# Patient Record
Sex: Female | Born: 1935 | Race: White | Hispanic: No | Smoking: Never smoker
Health system: Southern US, Community
[De-identification: ages and names within clinical notes are randomized; demographics above are authoritative.]

## PROBLEM LIST (undated history)

## (undated) DIAGNOSIS — R269 Unspecified abnormalities of gait and mobility: Secondary | ICD-10-CM

## (undated) DIAGNOSIS — S32009A Unspecified fracture of unspecified lumbar vertebra, initial encounter for closed fracture: Secondary | ICD-10-CM

## (undated) DIAGNOSIS — R011 Cardiac murmur, unspecified: Secondary | ICD-10-CM

## (undated) DIAGNOSIS — L570 Actinic keratosis: Secondary | ICD-10-CM

## (undated) DIAGNOSIS — H409 Unspecified glaucoma: Secondary | ICD-10-CM

## (undated) DIAGNOSIS — R918 Other nonspecific abnormal finding of lung field: Secondary | ICD-10-CM

## (undated) DIAGNOSIS — R5383 Other fatigue: Secondary | ICD-10-CM

## (undated) DIAGNOSIS — S12600A Unspecified displaced fracture of seventh cervical vertebra, initial encounter for closed fracture: Secondary | ICD-10-CM

## (undated) DIAGNOSIS — I7090 Unspecified atherosclerosis: Secondary | ICD-10-CM

## (undated) DIAGNOSIS — R739 Hyperglycemia, unspecified: Secondary | ICD-10-CM

## (undated) DIAGNOSIS — K219 Gastro-esophageal reflux disease without esophagitis: Secondary | ICD-10-CM

## (undated) DIAGNOSIS — K5900 Constipation, unspecified: Secondary | ICD-10-CM

## (undated) DIAGNOSIS — B0229 Other postherpetic nervous system involvement: Secondary | ICD-10-CM

## (undated) DIAGNOSIS — E039 Hypothyroidism, unspecified: Secondary | ICD-10-CM

## (undated) DIAGNOSIS — Z9181 History of falling: Principal | ICD-10-CM

## (undated) DIAGNOSIS — N182 Chronic kidney disease, stage 2 (mild): Secondary | ICD-10-CM

## (undated) DIAGNOSIS — M7989 Other specified soft tissue disorders: Secondary | ICD-10-CM

## (undated) DIAGNOSIS — R5381 Other malaise: Secondary | ICD-10-CM

## (undated) DIAGNOSIS — G319 Degenerative disease of nervous system, unspecified: Secondary | ICD-10-CM

## (undated) DIAGNOSIS — S72142A Displaced intertrochanteric fracture of left femur, initial encounter for closed fracture: Secondary | ICD-10-CM

## (undated) DIAGNOSIS — IMO0002 Reserved for concepts with insufficient information to code with codable children: Secondary | ICD-10-CM

## (undated) DIAGNOSIS — R531 Weakness: Secondary | ICD-10-CM

## (undated) DIAGNOSIS — R413 Other amnesia: Secondary | ICD-10-CM

## (undated) DIAGNOSIS — I10 Essential (primary) hypertension: Secondary | ICD-10-CM

## (undated) DIAGNOSIS — H9190 Unspecified hearing loss, unspecified ear: Secondary | ICD-10-CM

## (undated) DIAGNOSIS — I679 Cerebrovascular disease, unspecified: Secondary | ICD-10-CM

## (undated) DIAGNOSIS — R9389 Abnormal findings on diagnostic imaging of other specified body structures: Secondary | ICD-10-CM

## (undated) DIAGNOSIS — E785 Hyperlipidemia, unspecified: Secondary | ICD-10-CM

## (undated) DIAGNOSIS — E871 Hypo-osmolality and hyponatremia: Secondary | ICD-10-CM

## (undated) DIAGNOSIS — B029 Zoster without complications: Secondary | ICD-10-CM

## (undated) DIAGNOSIS — D638 Anemia in other chronic diseases classified elsewhere: Secondary | ICD-10-CM

## (undated) DIAGNOSIS — S22019A Unspecified fracture of first thoracic vertebra, initial encounter for closed fracture: Secondary | ICD-10-CM

## (undated) DIAGNOSIS — H04129 Dry eye syndrome of unspecified lacrimal gland: Secondary | ICD-10-CM

## (undated) HISTORY — DX: Other amnesia: R41.3

## (undated) HISTORY — PX: CHOLECYSTECTOMY: SHX55

## (undated) HISTORY — DX: Gastro-esophageal reflux disease without esophagitis: K21.9

## (undated) HISTORY — DX: History of falling: Z91.81

## (undated) HISTORY — DX: Unspecified glaucoma: H40.9

## (undated) HISTORY — DX: Other malaise: R53.81

## (undated) HISTORY — DX: Other specified soft tissue disorders: M79.89

## (undated) HISTORY — DX: Other fatigue: R53.83

## (undated) HISTORY — DX: Unspecified fracture of unspecified lumbar vertebra, initial encounter for closed fracture: S32.009A

## (undated) HISTORY — DX: Constipation, unspecified: K59.00

## (undated) HISTORY — DX: Dry eye syndrome of unspecified lacrimal gland: H04.129

## (undated) HISTORY — DX: Chronic kidney disease, stage 2 (mild): N18.2

## (undated) HISTORY — DX: Unspecified abnormalities of gait and mobility: R26.9

## (undated) HISTORY — DX: Unspecified displaced fracture of seventh cervical vertebra, initial encounter for closed fracture: S12.600A

## (undated) HISTORY — DX: Hypo-osmolality and hyponatremia: E87.1

## (undated) HISTORY — DX: Cerebrovascular disease, unspecified: I67.9

## (undated) HISTORY — DX: Essential (primary) hypertension: I10

## (undated) HISTORY — DX: Other postherpetic nervous system involvement: B02.29

## (undated) HISTORY — DX: Cardiac murmur, unspecified: R01.1

## (undated) HISTORY — DX: Weakness: R53.1

## (undated) HISTORY — DX: Hypothyroidism, unspecified: E03.9

## (undated) HISTORY — DX: Hyperlipidemia, unspecified: E78.5

## (undated) HISTORY — DX: Abnormal findings on diagnostic imaging of other specified body structures: R93.89

## (undated) HISTORY — DX: Anemia in other chronic diseases classified elsewhere: D63.8

## (undated) HISTORY — DX: Unspecified fracture of first thoracic vertebra, initial encounter for closed fracture: S22.019A

## (undated) HISTORY — DX: Displaced intertrochanteric fracture of left femur, initial encounter for closed fracture: S72.142A

## (undated) HISTORY — DX: Unspecified atherosclerosis: I70.90

## (undated) HISTORY — DX: Unspecified hearing loss, unspecified ear: H91.90

## (undated) HISTORY — DX: Hyperglycemia, unspecified: R73.9

## (undated) HISTORY — DX: Zoster without complications: B02.9

## (undated) HISTORY — DX: Reserved for concepts with insufficient information to code with codable children: IMO0002

## (undated) HISTORY — DX: Actinic keratosis: L57.0

## (undated) HISTORY — DX: Degenerative disease of nervous system, unspecified: G31.9

---
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
## 1998-03-31 ENCOUNTER — Ambulatory Visit (HOSPITAL_COMMUNITY): Admission: RE | Admit: 1998-03-31 | Discharge: 1998-03-31 | Payer: Self-pay | Admitting: Emergency Medicine

## 1998-04-28 ENCOUNTER — Other Ambulatory Visit: Admission: RE | Admit: 1998-04-28 | Discharge: 1998-04-28 | Payer: Self-pay | Admitting: Emergency Medicine

## 1999-03-14 ENCOUNTER — Other Ambulatory Visit: Admission: RE | Admit: 1999-03-14 | Discharge: 1999-03-14 | Payer: Self-pay | Admitting: Emergency Medicine

## 1999-04-04 ENCOUNTER — Ambulatory Visit (HOSPITAL_COMMUNITY): Admission: RE | Admit: 1999-04-04 | Discharge: 1999-04-04 | Payer: Self-pay | Admitting: Emergency Medicine

## 1999-04-04 ENCOUNTER — Encounter: Payer: Self-pay | Admitting: Emergency Medicine

## 2002-10-22 HISTORY — PX: CARDIAC CATHETERIZATION: SHX172

## 2003-08-13 ENCOUNTER — Ambulatory Visit (HOSPITAL_COMMUNITY): Admission: RE | Admit: 2003-08-13 | Discharge: 2003-08-13 | Payer: Self-pay | Admitting: Emergency Medicine

## 2003-08-13 ENCOUNTER — Encounter: Payer: Self-pay | Admitting: Emergency Medicine

## 2004-07-18 ENCOUNTER — Encounter (INDEPENDENT_AMBULATORY_CARE_PROVIDER_SITE_OTHER): Payer: Self-pay | Admitting: Cardiology

## 2004-07-18 ENCOUNTER — Inpatient Hospital Stay (HOSPITAL_COMMUNITY): Admission: EM | Admit: 2004-07-18 | Discharge: 2004-07-20 | Payer: Self-pay | Admitting: Emergency Medicine

## 2004-07-25 ENCOUNTER — Ambulatory Visit: Payer: Self-pay | Admitting: Cardiology

## 2004-09-08 ENCOUNTER — Ambulatory Visit (HOSPITAL_COMMUNITY): Admission: RE | Admit: 2004-09-08 | Discharge: 2004-09-08 | Payer: Self-pay | Admitting: Emergency Medicine

## 2004-10-22 HISTORY — PX: GALLBLADDER SURGERY: SHX652

## 2004-11-09 ENCOUNTER — Ambulatory Visit: Payer: Self-pay | Admitting: Cardiology

## 2005-07-26 ENCOUNTER — Emergency Department (HOSPITAL_COMMUNITY): Admission: EM | Admit: 2005-07-26 | Discharge: 2005-07-26 | Payer: Self-pay | Admitting: Emergency Medicine

## 2005-07-31 ENCOUNTER — Encounter: Admission: RE | Admit: 2005-07-31 | Discharge: 2005-07-31 | Payer: Self-pay | Admitting: Emergency Medicine

## 2005-08-03 ENCOUNTER — Ambulatory Visit: Payer: Self-pay

## 2005-08-03 ENCOUNTER — Ambulatory Visit: Payer: Self-pay | Admitting: Cardiology

## 2005-08-05 ENCOUNTER — Encounter: Admission: RE | Admit: 2005-08-05 | Discharge: 2005-08-05 | Payer: Self-pay | Admitting: Emergency Medicine

## 2005-08-22 ENCOUNTER — Encounter (INDEPENDENT_AMBULATORY_CARE_PROVIDER_SITE_OTHER): Payer: Self-pay | Admitting: Specialist

## 2005-08-22 ENCOUNTER — Ambulatory Visit (HOSPITAL_COMMUNITY): Admission: RE | Admit: 2005-08-22 | Discharge: 2005-08-23 | Payer: Self-pay | Admitting: General Surgery

## 2005-10-11 ENCOUNTER — Ambulatory Visit (HOSPITAL_COMMUNITY): Admission: RE | Admit: 2005-10-11 | Discharge: 2005-10-11 | Payer: Self-pay | Admitting: Emergency Medicine

## 2006-03-04 ENCOUNTER — Encounter: Admission: RE | Admit: 2006-03-04 | Discharge: 2006-03-04 | Payer: Self-pay | Admitting: Emergency Medicine

## 2006-07-19 ENCOUNTER — Ambulatory Visit: Payer: Self-pay | Admitting: Cardiology

## 2006-08-08 ENCOUNTER — Ambulatory Visit: Payer: Self-pay

## 2006-08-19 ENCOUNTER — Ambulatory Visit: Payer: Self-pay | Admitting: Cardiology

## 2006-11-01 ENCOUNTER — Ambulatory Visit (HOSPITAL_COMMUNITY): Admission: RE | Admit: 2006-11-01 | Discharge: 2006-11-01 | Payer: Self-pay | Admitting: Emergency Medicine

## 2006-12-30 ENCOUNTER — Emergency Department (HOSPITAL_COMMUNITY): Admission: EM | Admit: 2006-12-30 | Discharge: 2006-12-30 | Payer: Self-pay | Admitting: Emergency Medicine

## 2007-10-28 ENCOUNTER — Encounter: Admission: RE | Admit: 2007-10-28 | Discharge: 2007-10-28 | Payer: Self-pay | Admitting: Emergency Medicine

## 2007-11-12 ENCOUNTER — Ambulatory Visit (HOSPITAL_COMMUNITY): Admission: RE | Admit: 2007-11-12 | Discharge: 2007-11-12 | Payer: Self-pay | Admitting: Emergency Medicine

## 2008-12-15 DIAGNOSIS — E039 Hypothyroidism, unspecified: Secondary | ICD-10-CM

## 2008-12-15 DIAGNOSIS — H409 Unspecified glaucoma: Secondary | ICD-10-CM

## 2008-12-15 DIAGNOSIS — I10 Essential (primary) hypertension: Secondary | ICD-10-CM

## 2008-12-15 HISTORY — DX: Essential (primary) hypertension: I10

## 2008-12-15 HISTORY — DX: Hypothyroidism, unspecified: E03.9

## 2008-12-15 HISTORY — DX: Unspecified glaucoma: H40.9

## 2009-06-30 DIAGNOSIS — H9190 Unspecified hearing loss, unspecified ear: Secondary | ICD-10-CM

## 2009-06-30 HISTORY — DX: Unspecified hearing loss, unspecified ear: H91.90

## 2009-12-29 DIAGNOSIS — R5381 Other malaise: Secondary | ICD-10-CM

## 2009-12-29 DIAGNOSIS — H04129 Dry eye syndrome of unspecified lacrimal gland: Secondary | ICD-10-CM

## 2009-12-29 HISTORY — DX: Other fatigue: R53.81

## 2009-12-29 HISTORY — DX: Dry eye syndrome of unspecified lacrimal gland: H04.129

## 2011-03-09 NOTE — Assessment & Plan Note (Signed)
[facility]                              CARDIOLOGY OFFICE NOTE   Kim Hodges, Kim Hodges                      MRN:          [id number]  DATE:07/19/2006                            DOB:          [birth year]    PRIMARY CARE PHYSICIAN:  Dr. Leslee Home.   PRIMARY CARDIOLOGIST:  Dr. Rollene Rotunda.   HISTORY OF PRESENT ILLNESS:  Kim Hodges is a very pleasant 77 year old  female patient with a history of normal coronary arteries by catheterization  September 2005, who presents to the office today with complaints of chest  and epigastric pain.  She notes that her symptoms date back probably 2  years.  They have been off and on since then.  About a month ago she went  and saw Dr. Lorenz Coaster for recurrence of her symptoms, and he suggested that she  come back and see [place].  She started to feel better and decided not to come.  Over the last couple of weeks, though, she has had a recurrence of her  symptoms, and has come to be seen.  She was seen in the office today.  She  notes an ache in her epigastrium and left chest from time to time.  This is  not necessarily related to exertion.  It not necessarily related to meals.  She denies any significant belching or water brash symptoms.  She does note  a dry cough.  She does feel nauseated from time to time.  She also does note  dizziness or lightheadedness with her symptoms.  This seems to be a more  prominent symptom for her recently.  She has felt her pulse in her abdomen  as well.  She denies orthopnea or paroxysmal nocturnal dyspnea.  She denies  syncope or pedal edema.   CURRENT MEDICATIONS:  1. Levothyroxine 60 mcg daily.  2. Norvasc 2.5 mg daily.  3. Xalatan eye drops.   ALLERGIES:  No known drug allergies.   SOCIAL HISTORY:  She denies any tobacco or alcohol abuse.   REVIEW OF SYSTEMS:  Please see HPI.  She denies any fevers, chills,  headache, sore throat, changes in her vision, dysphagia,  odynophagia,  melena, hematochezia, hematuria, dysuria.  She has noted some increasing  urinary frequency recently.  The rest of the review of systems are negative.   PHYSICAL EXAMINATION:  She is a well-developed, well-nourished female in no  distress.  Blood pressure 161/79, pulse 81, weight 120 pounds.  Orthostatic vital  signs; lying blood pressure 147/85, pulse 74; sitting 144/88, pulse 74;  standing 138/82 with a pulse of 80; after two minutes 156/80 with a pulse of  75; after five minutes 152/81 with a pulse of 79.  HEAD:  Normocephalic, atraumatic.  EYES:  PERRLA, EOMI.  Sclerae are clear.  NECK:  Without lymphadenopathy.  ENDOCRINE:  Without thyromegaly.  CAROTIDS:  Without bruits.  CARDIAC:  Normal S1, S2, regular rate and rhythm without murmurs, clicks,  rubs or gallops  LUNGS:  Clear to auscultation bilaterally without wheezing, rhonchi or  rales.  ABDOMEN:  With normoactive bowel sounds, soft.  She does have epigastric  tenderness with palpation.  There is no organomegaly.  EXTREMITIES:  Without edema, calves are soft, nontender.  SKIN:  Warm and dry.  NEUROLOGIC:  She is alert and oriented x3.  Cranial nerves II-XII are  grossly intact.   Electrocardiogram reveals sinus rhythm with a heart rate of 79, with  nonspecific ST-T wave changes.   IMPRESSION:  1. Chest and epigastric pain.  2. Lightheadedness.  3. Normal coronary arteries by catheterization September 2005.  4. Good left ventricular function.  5. Hyperlipidemia.  6. Status post cholecystectomy.  7. Hypertension.   PLAN:  The patient presents to the office today with chest and epigastric  pain.  She also has some lightheadedness.  She is able to sense her pulse in  her abdomen from time to time as well.  She had normal coronary arteries by  catheterization in 2005.  I do not think ischemia would be causing her pain.  It sounds atypical for ischemia.  She has had CT scans show some changes in  her  pancreas in the past, and this is followed serially by Dr. Lorenz Coaster.  I  think her symptoms are most likely gastrointestinal.  We will start her on  Protonix 40 mg a day.  We will also check a CMET, CBC, TSH, amylase and  lipase.  We will also get an abdominal ultrasound to rule out abdominal  aortic aneurysm.  Her blood pressure is uncontrolled, and I have asked her  to increase her Norvasc to 5 mg a day.  I will bring the patient back in  followup with Dr. Antoine Poche in the next couple of weeks.  I discussed the  above assessment and plan with Dr. Jens Som today, who agreed.      ______________________________  Tereso Newcomer, PA-C    ______________________________  Madolyn Frieze. Jens Som, MD, [facility]   SW/MedQ  DD:  07/19/2006  DT:  07/21/2006  Job #:  [id number]   cc:   Reuben Likes, M.D.

## 2011-03-09 NOTE — Op Note (Signed)
NAMEMALIN, SAMBRANO               ACCOUNT NO.:  [id number]   MEDICAL RECORD NO.:  [id number]          PATIENT TYPE:  OIB   LOCATION:  1318                         FACILITY:  [facility]   PHYSICIAN:  Leonie Man, M.D.   DATE OF BIRTH:  07-Mar-1909   DATE OF PROCEDURE:  08/22/2005  DATE OF DISCHARGE:                                 OPERATIVE REPORT   PREOPERATIVE DIAGNOSIS:  Symptomatic cholelithiasis.   POSTOPERATIVE DIAGNOSIS:  Symptomatic cholelithiasis.   PROCEDURE:  Laparoscopic cholecystectomy with intraoperative cholangiogram.   SURGEON:  Leonie Man, M.D.   ASSISTANT:  Currie Paris, M.D.   ANESTHESIA:  General.   SPECIMENS TO LAB:  Gallbladder with stones.   ESTIMATED BLOOD LOSS:  Minimal.   COMPLICATIONS:  None. The patient returned to the PACU in excellent  condition.   HISTORY:  Ms. Weatherholtz is a 77 year old lady presenting with right upper  quadrant epigastric and right-sided shoulder pain associated with nausea but  without vomiting. On evaluation by CT scan, she is noted to have a stone in  the neck of the gallbladder but it was not obstructing. Her intrahepatic and  extrahepatic biliary ducts appeared to be normal. However, she did have some  ectasia of her pancreatic duct on CT scan. All of her liver function studies  were within normal limits and she did not have any elevation of amylase or  lipase. The patient comes to the operating room now after the risks and  potential benefits of surgery have been discussed, all questions answered  and consent obtained.   PROCEDURE:  Following the induction of satisfactory general anesthesia, the  patient is positioned supinely. The abdomen is routinely prepped and draped  to be included in a sterile operative field. Open laparoscopy is created at  the umbilicus with insertion of a Hassan cannula and insufflation of the  peritoneal cavity to 14 mmHg pressure using carbon dioxide. The camera was  inserted.  There were multiple adhesions at the anterior abdominal wall due  to prior abdominal surgery. These were taken down serially both  laparoscopically and by finger dissection at the umbilicus. After the camera  was inserted, visual exploration of the abdomen carried out. The anterior  gastric wall duodenal sweep appeared to be normal, the liver edge was sharp,  liver surfaces smooth. The gallbladder was chronically scarred. None of the  small or large intestine viewed appeared to be abnormal. Pelvic organs were  not visualized.   Under direct vision, epigastric and lateral ports were placed. The  gallbladder is grasped and retracted cephalad. Dissection carried down in  the region of the hepatoduodenal ligament with isolation of the cystic  artery and cystic duct. The cystic artery is traced to its entry into the  gallbladder wall and the cystic duct was traced up to the gallbladder cystic  duct junction and down to the cystic duct, common bile duct junction. The  cystic duct was clipped proximally and opened and a Cook catheter placed  into the abdomen and then inserted into the cystic duct. Injection of 1/2  strength Hypaque dye into the extrahepatic  biliary system for cholangiogram  was carried out under fluoroscopic control. The resulting cholangiogram  showed prompt flow of contrast into the common bile duct into the duodenum  with some delayed entry into the upper hepatic radicles. The distal common  duct tapered well as well as the caliber of the common bile duct was  somewhat ectatic but otherwise within normal limits. The cystic duct  catheter was removed and the cystic duct doubly clipped and transected. The  cystic artery was doubly clipped and transected and the gallbladder was then  dissected free from the liver bed using electrocautery maintaining  hemostasis throughout the entire course of the dissection. At the end of  dissection, the gallbladder was placed in an Endopouch,  additional bleeding  points within the liver bed were treated with electrocautery. The camera was  then moved to the epigastric port and the gallbladder was retrieved through  the umbilical port without difficulty. Sponge, instrument and sharp counts  were verified. The pneumoperitoneum was released and the lateral trocars  were removed under direct vision. The umbilical wound was then closed in two  layers with #0 Dexon and 4-0 Monocryl. The epigastric wound and the lateral  flank wounds were closed with 4-0 Monocryl sutures placed intradermally. All  of the wounds were then reinforced with Steri-Strips and sterile dressings  were applied. The anesthetic reversed and the patient removed from the  operating room to the recovery room in stable condition. She tolerated the  procedure well.      Leonie Man, M.D.  Electronically Signed     PB/MEDQ  D:  08/22/2005  T:  08/23/2005  Job:  [id number]   cc:   Reuben Likes, M.D.  Fax: [phone]

## 2011-03-09 NOTE — Cardiovascular Report (Signed)
NAMEKAYANI, RAPAPORT               ACCOUNT NO.:  [id number]   MEDICAL RECORD NO.:  [id number]          PATIENT TYPE:  INP   LOCATION:  3711                         FACILITY:  [facility]   PHYSICIAN:  Salvadore Farber, M.D. LHCDATE OF BIRTH:  01/08/1909   DATE OF PROCEDURE:  07/20/2004  DATE OF DISCHARGE:                              CARDIAC CATHETERIZATION   PROCEDURE:  1.  Left cardiac catheterization.  2.  Left ventriculography.  3.  Coronary angiography.   CARDIOLOGIST:  Salvadore Farber, M.D.   INDICATIONS FOR PROCEDURE:  The patient is a 77 year old lady without a  prior history of cardiovascular disease.  The risks factors are diet-  controlled diabetes and dyslipidemia, as well as age.  She presents with  exertional chest discomfort and dizziness over the past several weeks.  She  was admitted to the hospital and ruled out for a myocardial infarction.  She  was referred for a diagnostic angiography to exclude coronary artery disease  as the etiology of her symptoms.   DESCRIPTION OF PROCEDURE:  An informed consent was obtained.  Under 1%  lidocaine local anesthesia, a 5-French sheath was placed in the right common  femoral artery using the modified Seldinger technique.  A diagnostic  angiography and ventriculography were performed using 5-French JL4, JR4 and  pigtail catheters.  The patient tolerated the procedure well and was  transferred to the holding room in stable condition.   COMPLICATIONS:  None.   FINDINGS:  LV:  157/2/8.  Ejection fraction greater than 70% without regional wall motion abnormality.  No aortic stenosis or mitral regurgitation.  There is mild mitral annular  calcification and mild calcification of the aortic valve.   RESULTS:  1.  LEFT MAIN CORONARY ARTERY:  The left main coronary artery is a long      vessel which is angiographically normal.  2.  LEFT ANTERIOR DESCENDING CORONARY ARTERY:  The left anterior descending      coronary artery is a  moderate-sized vessel giving rise to three modest-      sized diagonal branches.  It is angiographically normal.  3.  CIRCUMFLEX CORONARY ARTERY:  The circumflex coronary artery is a      moderate-sized vessel, giving rise to a single large obtuse marginal.      It is angiographically normal.  4.  RIGHT CORONARY ARTERY:  The right coronary artery is a large dominant      vessel.  It was angiographically normal.   IMPRESSION/RECOMMENDATIONS:  Angiographically-normal coronary arteries.  Normal left ventricular size and systolic function.  I suspect a non-cardiac  etiology to her chest discomfort.       WED/MEDQ  D:  07/20/2004  T:  07/20/2004  Job:  [id number]   cc:   Reuben Likes, M.D.  [address]  [place]  [place] [address]  Fax: [phone]   Rollene Rotunda, M.D.

## 2011-03-09 NOTE — Consult Note (Signed)
Kim Hodges, Kim Hodges               ACCOUNT NO.:  [id number]   MEDICAL RECORD NO.:  [id number]          PATIENT TYPE:  INP   LOCATION:  3711                         FACILITY:  [facility]   PHYSICIAN:  Rollene Rotunda, M.D.   DATE OF BIRTH:  Mar 25, 1909   DATE OF CONSULTATION:  07/18/2004  DATE OF DISCHARGE:                                   CONSULTATION   PRIMARY CARE PHYSICIAN:  Dr. Leslee Home.   CONSULTING PHYSICIAN:  Dr. Lonia Blood.   REASON FOR PRESENTATION:  Evaluate patient with arm, chest discomfort and  dizziness.   HISTORY OF PRESENT ILLNESS:  The patient is a lovely, remarkable 77 year old  white female with no prior cardiac history.  She was in her usual state of  good health until approximately two or three days ago.  She noticed episodes  of dizziness.  These occurred predominantly with exertion, and she noticed  after eating meals. She felt presyncopal, but has had no frank syncope.  The  symptoms would stop when she would rest.  She was describing some mild  shortness of breath.  The longest episode was approximately 20 minutes.  She  did feel her heart racing.  She had a couple of episodes through the  weekend.  Yesterday in the afternoon, she was picking beans and then later  getting ready for bed, and developed similar symptoms.  However, with this  episode, she had arm discomfort.  It was a tightness.  She had some  tightness in her chest.  She did feel her heart racing. She called the  emergency squad and was given two sublingual nitroglycerin with improvement.  The symptoms lasted for approximately one hour.  She has had some mild,  resting chest discomfort occurring one time since admission.  Enzymes have  so far been negative, and her telemetry strip has demonstrated sinus rhythm.  She has had no acute EKG changes.   Otherwise, the patient has been well.  She is active, doing gardening, and  likes to pick black walnuts. She can vacuum and do other things  without  these symptoms.  She does not seem to get chest pressure, neck discomfort or  shortness of breath.  She has no PND or orthopnea.   PAST MEDICAL HISTORY:  1.  Diabetes, diet controlled.  2.  Hyperlipidemia.  3.  Anemia many years ago.   PAST SURGICAL HISTORY:  1.  Cataracts.  2.  Hysterectomy.  3.  Lysis of adhesions.   ALLERGIES:  NONE.   MEDICATIONS:  Prior to admission:  1.  Multivitamin.  2.  Calcium.  3.  Fish oil.  4.  Aspirin 325 mg daily.   SOCIAL HISTORY:  The patient lives in [address].  She is a widow.  She  lives with her granddaughter.  She is retired.  She does not smoke  cigarettes or drink alcohol, and never has.   FAMILY HISTORY:  Noncontributory for early coronary artery disease.  Her  mother lived to be 105.   REVIEW OF SYSTEMS:  As stated in the HPI, and positive for staining of her  fingers related to picking black walnuts.  Negative for all other systems.   PHYSICAL EXAMINATION:  GENERAL:  The patient is in no distress.  VITAL SIGNS:  Blood pressure 129/74, heart rate 72 and regular, afebrile.  HEENT:  Eyes are unremarkable.  Pupils are equal, round and reactive to  light.  Fundi are not visualized.  Oral mucosa unremarkable.  NECK:  No jugular venous distension.  Waveform within normal limits.  Carotid upstroke brisk and symmetric, no bruits or thyromegaly.  LYMPHATICS:  No cervical, axillary, or inguinal adenopathy.  LUNGS:  Clear to auscultation bilaterally.  BACK:  No costovertebral angle tenderness.  CHEST:  Unremarkable.  HEART:  PMI displaced or sustained.  S1 and S2 within normal limits, no S3,  no S4, no murmurs.  ABDOMEN:  Flat, positive bowel sounds, normal in frequency and pitch.  No  bruits, no rebound, no guarding, no pulsatile mass, no hepatomegaly, or  splenomegaly.  SKIN:  No rashes, no nodules.  EXTREMITIES:  There are 2+ pulses throughout.  No edema, no cyanosis, no  clubbing.  No bruits.  NEUROLOGIC:  Oriented to  person, place and time.  Cranial nerves II-XII are  grossly intact.  Motor grossly intact.   EKG:  Sinus rhythm, axis within normal limits.  Intervals within normal  limits.  No acute ST-T wave changes.   ASSESSMENT/PLAN:  1.  Chest and arm discomfort.  This is very worrisome in this patient with      risk factors and advanced age.  Physiologically, she is much younger      than her 89 years.  I think the pre-test probability is moderately high      for obstructive coronary disease.  Therefore, given all of this, I would      suggest cardiac catheterization as the diagnostic test of choice.  I      have had a long discussion with the patient and her granddaughter      regarding this, and they understand the risks and benefits and agree to      proceed.  2.  Dizziness.  This is less likely to be an anginal equivalent.  If she      does not have obstructive coronary disease,  I      would suggest an outpatient event monitor to evaluate this.  3.  Risk reduction.  The degree to which we manage her dyslipidemia will be      based on the presence or absence of obstructive coronary disease.       JH/MEDQ  D:  07/18/2004  T:  07/18/2004  Job:  [id number]   cc:   Reuben Likes, M.D.  [address]  [place]  [place] [address]  Fax: [phone]   Lonia Blood, M.D.  Fax: [phone]

## 2011-03-09 NOTE — H&P (Signed)
NAMEORION, VANDERVORT               ACCOUNT NO.:  [id number]   MEDICAL RECORD NO.:  [id number]          PATIENT TYPE:  INP   LOCATION:  3711                         FACILITY:  [facility]   PHYSICIAN:  Mobolaji B. Bakare, M.D.DATE OF BIRTH:  1909/07/08   DATE OF ADMISSION:  07/17/2004  DATE OF DISCHARGE:                                HISTORY & PHYSICAL   PRIMARY CARE PHYSICIAN:  Reuben Likes, M.D.   CHIEF COMPLAINT:  Dizziness after meals.   HISTORY OF PRESENT ILLNESS:  The patient started noticing dizziness  especially after eating breakfast about 3 days ago.  She first noticed it on  Saturday after breakfast about 20 minutes after breakfast.  She felt hard  inside.  There was no diaphoresis.  She felt like passing out, but she did  not pass out.  She had no nausea or vomiting.  It went away spontaneously.  She did have some feeling of blurred vision and shakiness and also pressure  retrosternally.  The following day after breakfast on Sunday, she  experienced similar symptoms.  It became more intense after supper tonight  and he was clammy with palpitations, also feeling numb and pain in her left  forearm.  Hence, EMS was called and they gave her four baby aspirin and two  nitroglycerin.  She felt better after the nitroglycerin.  She also received  sublingual nitroglycerin en route to the hospital.  On arrival in the  emergency department, she was chest pressure free as symptoms were  resolving.  She does have associated generalized weakness when she was  symptomatic.  She experienced retrosternal burning sensation last week which  resolved spontaneously.  Ms. Villatoro does gardening on a regular basis and  still went out to do gardening today.  Usually, she gardens for about 1-2  hours, but she could not do that today and she did not experience chest pain  or diaphoresis when she was doing the gardening.   She does not have any known cardiac history.  However, 3 years ago in  [place], where she was formerly residing, she went into the hospital for low  blood glucose.  At that time, she had a cardiac stress test which she said  was normal.   PAST MEDICAL HISTORY:  No known significant past medical history.  She just  recently had a physical done in May.  At that time, her cholesterol was  elevated so she was put on diet-control and fish oil by Dr. Lorenz Coaster.  A long  time ago, she was told that she had diabetes mellitus and she was put on  diet-control and exercise.  She has been checking her blood glucose ever  since and has remained within normal limits.   PAST SURGICAL HISTORY:  1.  Hysterectomy 60 years ago.  2.  Cataract removal bilaterally.   MEDICATIONS:  Vitamins, Calcium, aspirin 325 mg once a day, fish oil.   ALLERGIES:  No known drug allergies.   FAMILY HISTORY:  She has significant family history of pancreatic cancer.  Two of her brothers died of pancreatic cancer.  One sister  died of lymphoma.  Son died of lung cancer.  She is a widow.  She is involved in cancer  research program at [facility] for pancreatic cancer.   SOCIAL HISTORY:  Currently, the patient lives with her daughter.  She does  not use alcohol.  She never smoked.   REVIEW OF SYSTEMS:  CARDIOPULMONARY:  No shortness of breath.  NEUROLOGIC:  No headaches.  GASTROINTESTINAL:  No abdominal pain, diarrhea, vomiting or  constipation.  GENITOURINARY:  No symptoms.   PHYSICAL EXAMINATION:  VITAL SIGNS:  Blood pressure 160/82, temperature  97.3, pulse 79, respirations 16, O2 saturations 100%.  GENERAL:  She appears comfortable and looks younger than her stated age of  77.  Not pale.  HEENT:  Anicteric.  Pupils equal round and reactive to light.  No carotid  bruits.  No cervical lymphadenopathy.  LUNGS:  Clear to auscultation.  CARDIAC:  S1, S2, regular.  No murmurs, rubs or gallops.  ABDOMEN:  Nondistended.  Soft, nontender.  Bowel sounds present.  No  holosystolic murmur.   EXTREMITIES:  No pedal edema and no calf tenderness.  Dorsalis pedis pulses  palpable bilaterally.  NEUROLOGIC:  No focal neurological deficits.  SKIN:  No rash or petechiae.   LABORATORY DATA AND X-RAY FINDINGS:  Point of care cardiac enzymes with  myoglobin 227 ng/ml elevated.  Subsequently myoglobin dropped to 194, then  164, troponin was less than 0.05, CK-MB within normal limits.  White cell  count 9.4, hemoglobin 12.8, hematocrit 26.3, MCV 89.1, platelets 245.  Sodium 135, potassium 4.1, chloride 102, CO2 25, glucose 111, BUN 18,  creatinine 1.0.  Calculated creatinine clearance is 70.   EKG shows normal sinus rhythm with a heart rate of 78 beats per minute.  No  ST segment elevation, normal axis, low voltage in the precordial leads.   ASSESSMENT/PLAN:  Ms. Nembhard is a 77 year old, pleasant lady who was  previously healthy presenting with postprandial dizziness, chest pressure,  palpitations and tingling sensation in the left forearm.  She had elevated  myoglobin on initially evaluation in emergency department.  Electrocardiogram with no ST and T abnormality.   1.  Possible coronary artery disease with postprandial syncope.  We will      admit to telemetry.  Obtain serial cardiac enzymes, nitroglycerin paste      1/2 inch q.4h., Lovenox 60 mg subcu b.i.d., aspirin 325 mg p.o. q.d.,      Lopressor 12.5 mg p.o. b.i.d., sublingual nitroglycerin for chest pain.      Will check fasting lipid profile.  If the patient rules out for      myocardial infarction, would obtain Cardiolite stress test and call      cardiac consult.  2.  Hypertension.  This is probably due to chest pain and chest pressure.      Will monitor for now.  The patient is on low-dose Lopressor at this      point.  3.  History of hyperlipidemia on fish oil and diet.  Will repeat fasting      lipid profile.      MBB/MEDQ  D:  07/18/2004  T:  07/18/2004  Job:  [id number]  cc:   Reuben Likes, M.D.  [address]  [place]  [place] [address]  Fax: [phone]

## 2011-03-09 NOTE — Assessment & Plan Note (Signed)
[facility]                              CARDIOLOGY OFFICE NOTE   Kim, Hodges                      MRN:          [id number]  DATE:08/19/2006                            DOB:          1909-03-27    PRIMARY:  Dr. Leslee Home.   REASON FOR PRESENTATION:  Evaluate the patient with chest discomfort.   HISTORY OF PRESENT ILLNESS:  The patient is a lovely 77 year old who  presented last month for evaluation of chest discomfort.  It was felt to be  atypical.  She was put on Protonix 40 mg.  She thinks this helped.  It made  her feel stronger.  She does not really describe less chest discomfort,  however.  She did get an abdominal ultrasound because she was feeling her  stomach pulsate.  This demonstrated mild aortoiliac atherosclerosis, but no  aneurysm.   She comes back today and says that about every 7 days or so she will get  chest discomfort.  She describes it as a Therapist, sports in her chest.  She says  it is a pressure that comes on boom, boom, boom.  However, she does not  think it is cardiac.  I cannot clearly get a reason for this.  It happens  sporadically.  It does not bother her during her sleep.  She gets nervous  that it is going to get more intense.  It is a discomfort with some  radiation to her arm.  It has been the same discomfort that she has had over  time, including at the time of her catheterization in 2005.  It goes away  with rest.  She says that her blood pressure has been better controlled  since her Norvasc was increased at the last appointment.   PAST MEDICAL HISTORY:  Well-preserved ejection fraction.  No coronary artery  disease.  Hyperlipidemia.  Hysterectomy.  Cataract surgery.  Hypertension.   ALLERGIES:  None.   MEDICATIONS:  1. Levothyroxine 50 mcg q. day.  2. Xalatan eye drops.  3. Norvasc 5 mg q. day.  4. Protonix 40 mg q. day.   REVIEW OF SYSTEMS:  As stated in the HPI.  Otherwise negative for other  systems.   PHYSICAL EXAMINATION:  The patient is in no distress.  Blood pressure 162/74, heart rate 80 and regular, weight 125 pounds, body  mass index 21.  HEENT:  Eyes unremarkable.  Pupils equal, round, and reactive to light.  Fundi not visualized.  Oral mucosa unremarkable.  NECK:  No jugular venous distension at 45 degrees, carotid upstroke brisk  and symmetric.  No bruits or thyromegaly.  LYMPHATICS:  No cervical, axillary, or inguinal adenopathy.  LUNGS:  Clear to auscultation bilaterally.  BACK:  No costovertebral angle tenderness.  CHEST:  Unremarkable.  HEART:  PMI not displaced or sustained.  S1, S2 within normal limits.  No  S3, no S4.  No murmurs.  ABDOMEN:  Flat, positive bowel sounds, normal frequency and pitch.  No  bruits, rebound, guarding, or midline pulsatile masses, hepatomegaly, or  splenomegaly.  SKIN:  No rashes.  No  nodules.  EXTREMITIES:  2+ pulses throughout.  No edema, cyanosis, or clubbing.  NEURO:  Oriented to person, place, and time.  Cranial nerves 2-12 grossly  intact.  Motor grossly intact.   EKG:  Sinus rhythm.  Rate 73.  Axis within normal limits.  Intervals within  normal limits.  No acute ST-T wave change.   ASSESSMENT AND PLAN:  1. Chest, the patient's chest discomfort is non-anginal.  However, it      could be related to an irregular or ectopic heartbeat.  Certainly      sounds rhythmically like it could be cardiac.  We discussed options      including doing nothing, treating her with a daily beta blocker.      Ultimately, we have settled on an as needed beta blocker.  I will give      her a low-dose immediate release metoprolol 12.5 mg to be taken when      she gets the discomfort.  She should lie down.  She could continue this      as needed if she thinks it helps.  Some of this may be a component of      anxiety.  If she has any symptoms related to this, she will let me know      and she will stop taking it.  She will not take this daily,  however.  2. Hypertension.  Blood pressure is elevated today but had not been      elevated at home.  I will believe these readings, that she is having a      little bit of a white coat response.  This can be followed at home with      a blood pressure diary and adjustments made to her Norvasc as needed.  3. Followup.  I will put her down for a 56-month followup but we will see      her sooner if she gets worse rather than better.  She could cancel the      6 month followup if her symptoms go away all together.    ______________________________  Rollene Rotunda, MD, [facility]    JH/MedQ  DD: 08/19/2006  DT: 08/19/2006  Job #: [id number]   cc:   Kim Hodges, M.D.

## 2011-03-09 NOTE — Discharge Summary (Signed)
Kim Hodges, Kim Hodges               ACCOUNT NO.:  [id number]   MEDICAL RECORD NO.:  [id number]          PATIENT TYPE:  INP   LOCATION:  3711                         FACILITY:  [facility]   PHYSICIAN:  Michaelyn Barter, M.D. DATE OF BIRTH:  Mar 02, 1909   DATE OF ADMISSION:  07/17/2004  DATE OF DISCHARGE:  07/20/2004                                 DISCHARGE SUMMARY   FINAL DIAGNOSES:  1.  Atypical chest pain.  2.  Presyncope.   PROCEDURES:  Cardiac catheterization.   COMPLICATIONS:  None.   HISTORY OF PRESENT ILLNESS:  Kim Hodges is a 77 year old female with a  questionable past medical history of diabetes mellitus diet controlled, who  was admitted secondary to a chief complaint of dizziness following her  meals.  At the time of her presentation, she stated that the dizziness was  initially noted approximately three days prior to admission.  She denied  having any diaphoresis when this occurred.  She felt like passing out,  however, she never did.  The dizziness resolved spontaneously.  After the  initial onset, she stated that the following day she experienced a similar  episode, however, it was more intense.  She felt clammy with palpitations.  She also experienced some numbness and some pain in her left forearm.  EMS  was called and she was given a baby aspirin and some sublingual  nitroglycerin.  She stated that her symptoms resolved following the  nitroglycerin.  She also complained of some retrosternal burning sensation  during the previous week.   PAST MEDICAL HISTORY:  1.  Hypercholesterolemia controlled by diet and fish oil.  2.  Questionable diabetes mellitus.   PAST SURGICAL HISTORY:  1.  Hysterectomy 60 years ago.  2.  Cataract removal bilaterally.   HOME MEDICATIONS:  1.  Vitamins.  2.  Calcium.  3.  Aspirin 325 mg a day.  4.  Fish oil.   ALLERGIES:  No known drug allergies.   FAMILY HISTORY:  Two brothers died from pancreatic CA.  One sister died from  lymphoma.  One son died from lung cancer.   SOCIAL HISTORY:  The patient denies alcohol.  She denies cigarettes.   HOSPITAL COURSE:  The patient had an elevated myoglobin on her initial  evaluation in the emergency room.  However, her EKG revealed no ST or T wave  abnormalities.  She was admitted into the hospital to the telemetry floor.  Cardiac enzymes were drawn and nitroglycerin paste was started.  She was  also started on Lovenox 60 mg subcutaneously b.i.d., aspirin 325 mg p.o.  daily, Lopressor 12.5 mg b.i.d. and sublingual nitroglycerin, all of which  was done to rule out a cardiac event.  Subsequently cardiology was consulted  and Dr. Antoine Hodges was the physician who responded to the consult.  At that  time, he felt that the pretest probability was moderately high for  obstructive coronary artery disease and he suggested a cardiac  catheterization as the diagnostic test of choice.  This morning, the patient  underwent a cardiac catheterization, the results of which revealed a left  main coronary  artery that is reported to be angiographically normal.  The  left anterior descending coronary artery was also angiographically normal.  The circumflex coronary artery was also angiographically normal.  Finally,  the right coronary artery was reported as being angiographically normal.  As  per cardiology's note, the patient was okay following the procedure.  There  were no complications during the procedure and their note following the  procedure indicated that the patient should be fine from their point of view  to be discharged home.  Currently the patient states that she feels fine.  She denies having any chest pain.  She denies having any shortness of  breath.  She states that she would like to go home today as soon as  possible.   CONDITION ON DISCHARGE:  She appears to be in no obvious distress.  Her  vitals are temperature 97.8 degrees, heart rate 62, respirations 18, blood   pressure 116/61 and SPO2 99% on room air.  On heart exam, S1 and S2 are  present with regular rate and rhythm.  Her lungs are clear bilaterally.  Her  abdomen is soft.  Extremities with no leg edema.   DISPOSITION:  The patient's condition at the time of discharge is much  improved.  She will be discharged home today.   FOLLOWUP:  She is instructed to follow up with Dr. Antoine Hodges on July 29, 2004, at 3 p.m.  Likewise, she is also instructed to follow up with her  primary care physician, Kim Hodges, M.D., within one week.   DISCHARGE MEDICATIONS:  She will resume all of her previously prescribed  home medications.       OR/MEDQ  D:  07/20/2004  T:  07/20/2004  Job:  [id number]   cc:   Kim Hodges, M.D.  [address]  [place]  [place] [address]  Fax: [phone]   Rollene Rotunda, M.D.

## 2011-07-26 DIAGNOSIS — R269 Unspecified abnormalities of gait and mobility: Secondary | ICD-10-CM

## 2011-07-26 DIAGNOSIS — R7989 Other specified abnormal findings of blood chemistry: Secondary | ICD-10-CM | POA: Insufficient documentation

## 2011-07-26 DIAGNOSIS — E785 Hyperlipidemia, unspecified: Secondary | ICD-10-CM

## 2011-07-26 DIAGNOSIS — L570 Actinic keratosis: Secondary | ICD-10-CM

## 2011-07-26 DIAGNOSIS — R739 Hyperglycemia, unspecified: Secondary | ICD-10-CM

## 2011-07-26 HISTORY — DX: Actinic keratosis: L57.0

## 2011-07-26 HISTORY — DX: Hyperlipidemia, unspecified: E78.5

## 2011-07-26 HISTORY — DX: Hyperglycemia, unspecified: R73.9

## 2011-07-26 HISTORY — DX: Unspecified abnormalities of gait and mobility: R26.9

## 2011-09-11 ENCOUNTER — Ambulatory Visit
Admission: RE | Admit: 2011-09-11 | Discharge: 2011-09-11 | Disposition: A | Discharge status: Home / Self Care | Payer: Medicare Other | Source: Ambulatory Visit | Attending: Internal Medicine | Admitting: Internal Medicine

## 2011-09-11 ENCOUNTER — Other Ambulatory Visit: Payer: Self-pay | Admitting: Internal Medicine

## 2011-09-11 DIAGNOSIS — M1710 Unilateral primary osteoarthritis, unspecified knee: Secondary | ICD-10-CM

## 2011-09-11 DIAGNOSIS — M79606 Pain in leg, unspecified: Secondary | ICD-10-CM

## 2011-09-11 DIAGNOSIS — R52 Pain, unspecified: Secondary | ICD-10-CM

## 2011-09-11 DIAGNOSIS — M7989 Other specified soft tissue disorders: Secondary | ICD-10-CM

## 2011-09-11 HISTORY — DX: Unilateral primary osteoarthritis, unspecified knee: M17.10

## 2012-09-10 DIAGNOSIS — M7989 Other specified soft tissue disorders: Secondary | ICD-10-CM

## 2012-09-10 HISTORY — DX: Other specified soft tissue disorders: M79.89

## 2012-12-25 DIAGNOSIS — N182 Chronic kidney disease, stage 2 (mild): Secondary | ICD-10-CM

## 2012-12-25 HISTORY — DX: Chronic kidney disease, stage 2 (mild): N18.2

## 2013-01-28 ENCOUNTER — Other Ambulatory Visit: Payer: Self-pay | Admitting: Internal Medicine

## 2013-07-03 DIAGNOSIS — R413 Other amnesia: Secondary | ICD-10-CM

## 2013-07-03 HISTORY — DX: Other amnesia: R41.3

## 2013-07-09 ENCOUNTER — Non-Acute Institutional Stay: Payer: Medicare PPO | Admitting: Internal Medicine

## 2013-07-09 ENCOUNTER — Encounter: Payer: Self-pay | Admitting: Internal Medicine

## 2013-07-09 VITALS — BP 132/60 | HR 68 | Ht 59.0 in | Wt 127.0 lb

## 2013-07-09 DIAGNOSIS — R413 Other amnesia: Secondary | ICD-10-CM | POA: Insufficient documentation

## 2013-07-09 DIAGNOSIS — E785 Hyperlipidemia, unspecified: Secondary | ICD-10-CM

## 2013-07-09 DIAGNOSIS — R7989 Other specified abnormal findings of blood chemistry: Secondary | ICD-10-CM

## 2013-07-09 DIAGNOSIS — H409 Unspecified glaucoma: Secondary | ICD-10-CM

## 2013-07-09 DIAGNOSIS — N182 Chronic kidney disease, stage 2 (mild): Secondary | ICD-10-CM

## 2013-07-09 DIAGNOSIS — I10 Essential (primary) hypertension: Secondary | ICD-10-CM

## 2013-07-09 DIAGNOSIS — E039 Hypothyroidism, unspecified: Secondary | ICD-10-CM

## 2013-07-09 DIAGNOSIS — R269 Unspecified abnormalities of gait and mobility: Secondary | ICD-10-CM

## 2013-07-09 HISTORY — DX: Other amnesia: R41.3

## 2013-07-09 NOTE — Progress Notes (Signed)
Failed clock drawing  

## 2013-07-09 NOTE — Patient Instructions (Signed)
Continue current medications. 

## 2013-07-09 NOTE — Progress Notes (Signed)
Subjective:    Patient ID: Kim Hodges, female    DOB: 1909-08-17, 79 y.o.   MRN: [id number]  HPI Unspecified essential hypertension: Controlled  Memory loss: Obvious, but she is able to function independently  Other and unspecified hyperlipidemia: Followup lab next visit  Abnormality of gait: Using walker. No falls.  Other abnormal blood chemistry: History hyperglycemia. Glucose 108 on recent lab.  Unspecified hypothyroidism: Stable on supplements.  Chronic kidney disease, stage II (mild): Stable  Unspecified glaucoma(365.9): Unchanged    Current Outpatient Prescriptions on File Prior to Visit  Medication Sig Dispense Refill  . acetaminophen (TYLENOL ARTHRITIS PAIN) 650 MG CR tablet Take 650 mg by mouth. Take one tablet every 6 hours as needed for pain      . aspirin 81 MG tablet Take 81 mg by mouth daily. As needed      . Calcium Carbonate-Vitamin D (CALCIUM 600 + D PO) Take by mouth. Take one tablet daily      . hydrochlorothiazide (MICROZIDE) 12.5 MG capsule TAKE 1 CAPSULE EVERY DAY FOR BLOOD PRESSURE AND SWELLING  90 capsule  3  . levothyroxine (SYNTHROID, LEVOTHROID) 75 MCG tablet Take 75 mcg by mouth daily before breakfast.      . Multiple Vitamins-Minerals (MULTIVITAMIN PO) Take by mouth. Take one tablet daily      . multivitamin-lutein (OCUVITE-LUTEIN) CAPS capsule Take 1 capsule by mouth daily.       No current facility-administered medications on file prior to visit.    Review of Systems  Constitutional: Negative for fever, activity change, appetite change, fatigue and unexpected weight change.  HENT: Positive for hearing loss. Negative for ear pain.   Eyes: Positive for visual disturbance.  Respiratory: Negative for cough, choking, shortness of breath and wheezing.   Cardiovascular: Positive for leg swelling. Negative for chest pain and palpitations.  Gastrointestinal: Negative.   Endocrine: Negative.   Musculoskeletal:       Generalized weakness. No  falls. Uses walker.  Allergic/Immunologic: Negative.   Neurological: Positive for weakness.  Hematological: Negative.   Psychiatric/Behavioral: Negative.        Objective:BP 132/60  Pulse 68  Ht 4\' 11"  (1.499 m)  Wt 127 lb (57.607 kg)  BMI 25.64 kg/m2    Physical Exam  Constitutional: She is oriented to person, place, and time. She appears well-developed and well-nourished. No distress.  HENT:  Head: Normocephalic.  Nose: Nose normal.  Loss of hearing bilaterally.  Eyes: Conjunctivae and EOM are normal. Pupils are equal, round, and reactive to light.  Corrective lenses  Neck: Neck supple. No JVD present. No tracheal deviation present. No thyromegaly present.  Cardiovascular: Normal rate, regular rhythm, normal heart sounds and intact distal pulses.  Exam reveals no friction rub.   Pulmonary/Chest: Effort normal and breath sounds normal. No respiratory distress. She has no wheezes. She has no rales.  Abdominal: She exhibits no distension and no mass. There is no tenderness.  Musculoskeletal: Normal range of motion. She exhibits edema. She exhibits no tenderness.  Unstable gait.  Lymphadenopathy:    She has no cervical adenopathy.  Neurological: She is alert and oriented to person, place, and time. She has normal reflexes. No cranial nerve deficit. Coordination normal.  07/09/13 MMSE: 21/30; failed clock drawing  Skin: Skin is dry. No rash noted. No erythema. No pallor.  Psychiatric: She has a normal mood and affect. Her behavior is normal. Judgment and thought content normal.      LABS 06/30/13 BMP: glu 108, BUN 31,  Creat 1.12  A1c 6.5    Assessment & Plan:  Unspecified essential hypertension: Continue current medication  Memory loss: Continue to observe. Not currently medicated.  Other and unspecified hyperlipidemia: Not Medicated at present. Followup lab next visit.  Abnormality of gait: Continue to use walker  Other abnormal blood chemistry: Blood sugars under  reasonable control.  Unspecified hypothyroidism: Stable on current medication  Chronic kidney disease, stage II (mild): Unchanged  Unspecified glaucoma(365.9): Unchanged

## 2013-07-13 ENCOUNTER — Ambulatory Visit (INDEPENDENT_AMBULATORY_CARE_PROVIDER_SITE_OTHER): Payer: Medicare PPO | Admitting: Nurse Practitioner

## 2013-07-13 ENCOUNTER — Encounter: Payer: Self-pay | Admitting: Nurse Practitioner

## 2013-07-13 VITALS — BP 122/72 | HR 68 | Temp 97.1°F | Wt 125.0 lb

## 2013-07-13 DIAGNOSIS — B029 Zoster without complications: Secondary | ICD-10-CM

## 2013-07-13 MED ORDER — PREDNISONE 20 MG PO TABS: 20.0000 mg | ORAL_TABLET | Freq: Every day | ORAL | Status: DC

## 2013-07-13 MED ORDER — GABAPENTIN 100 MG PO CAPS: 100.0000 mg | ORAL_CAPSULE | Freq: Three times a day (TID) | ORAL | Status: DC

## 2013-07-13 MED ORDER — VALACYCLOVIR HCL 1 G PO TABS: 1000.0000 mg | ORAL_TABLET | Freq: Three times a day (TID) | ORAL | Status: DC

## 2013-07-13 NOTE — Progress Notes (Signed)
Patient ID: Kim Hodges, female   DOB: Jan 11, 1909, 79 y.o.   MRN: [id number]   No Known Allergies  Chief Complaint  Patient presents with  . Leg Problem    left upper leg concerns since Sunday, patient states left leg looks bad- denies falling   . Immunizations    patient with pending appointment in October for Flu Vaccine     HPI: Patient is a 80 y.o. female seen in the office today due to red [facility] and she needed to have evaluated. Reports red rash with large blisters that started last night; denies pain, itching, numbness or tingling. Says when she noticed it is looks the same as it does not ; does not feel like it has gotten worse. No fevers or chills Does not think she has not chicken pox and has not had her shingles vaccine Son reports she has been exposed to chicken pox because he had them when he was young  Review of Systems:  Review of Systems  Constitutional: Negative for fever, chills and malaise/fatigue.  Respiratory: Negative for shortness of breath.   Cardiovascular: Negative for chest pain and leg swelling.  Musculoskeletal: Negative for myalgias.  Skin: Positive for rash. Negative for itching.  Neurological: Negative for tingling, sensory change and weakness.     Past Medical History  Diagnosis Date  . Chronic kidney disease, stage II (mild) 12/25/2012  . Unspecified arthropathy, lower leg 09/11/2011  . Swelling of limb 09/10/2012  . Other and unspecified hyperlipidemia 07/26/2011  . Actinic keratosis 07/26/2011  . Abnormality of gait 07/26/2011  . Other abnormal blood chemistry 07/26/2011  . Tear film insufficiency, unspecified 12/29/2009  . Other malaise and fatigue 12/29/2009  . Unspecified hearing loss 06/30/2009  . Unspecified hypothyroidism 12/15/2008  . Unspecified essential hypertension 12/15/2008  . Unspecified glaucoma(365.9) 12/15/2008    both eyes   Past Surgical History  Procedure Laterality Date   . Small intestine surgery  1957  . Cardiac catheterization  2004  . Gallbladder surgery  2006   Social History:   reports that she has never smoked. She has never used smokeless tobacco. She reports that she does not drink alcohol or use illicit drugs.  Family History  Problem Relation Age of Onset  . Cancer Sister   . Cancer Brother     pancreatic  . Cancer Son     lung  . Cancer Brother     pancreatic    Medications: Patient's Medications  New Prescriptions   GABAPENTIN (NEURONTIN) 100 MG CAPSULE    Take 1 capsule (100 mg total) by mouth 3 (three) times daily.   PREDNISONE (DELTASONE) 20 MG TABLET    Take 1 tablet (20 mg total) by mouth daily.   VALACYCLOVIR (VALTREX) 1000 MG TABLET    Take 1 tablet (1,000 mg total) by mouth 3 (three) times daily.  Previous Medications   ACETAMINOPHEN (TYLENOL ARTHRITIS PAIN) 650 MG CR TABLET    Take 650 mg by mouth. Take one tablet every 6 hours as needed for pain   ASPIRIN 81 MG TABLET    Take 81 mg by mouth daily. As needed   CALCIUM CARBONATE-VITAMIN D (CALCIUM 600 + D PO)    Take by mouth. Take one tablet daily   DORZOLAMIDE-TIMOLOL (COSOPT) 22.3-6.8 MG/ML OPHTHALMIC SOLUTION    One drop in both eyes twice daily for glaucoma   HYDROCHLOROTHIAZIDE (MICROZIDE) 12.5 MG CAPSULE    TAKE  1 CAPSULE EVERY DAY FOR BLOOD PRESSURE AND SWELLING   LEVOTHYROXINE (SYNTHROID, LEVOTHROID) 75 MCG TABLET    Take 75 mcg by mouth daily before breakfast.   MULTIPLE VITAMINS-MINERALS (MULTIVITAMIN PO)    Take by mouth. Take one tablet daily   MULTIVITAMIN-LUTEIN (OCUVITE-LUTEIN) CAPS CAPSULE    Take 1 capsule by mouth daily.  Modified Medications   No medications on file  Discontinued Medications   No medications on file     Physical Exam:  Filed Vitals:   07/13/13 1522  BP: 122/72  Pulse: 68  Temp: 97.1 F (36.2 C)  TempSrc: Oral  Weight: 125 lb (56.7 kg)  SpO2: 97%    Physical Exam  Constitutional: She is well-developed, well-nourished, and  in no distress. No distress.  Cardiovascular: Normal rate, regular rhythm and normal heart sounds.   Pulmonary/Chest: Effort normal and breath sounds normal. No respiratory distress.  Musculoskeletal: She exhibits no edema and no tenderness.  Neurological: She is alert.  Diminished vibratory sensation at foot and ankle on left  Skin: Skin is warm. Rash noted. She is not diaphoretic.  No diaphoresis. Vesicular lesions in different stages of development on the left leg. Large blisters with small vesicles on anterior aspect of the leg that extend around to back with the majority of lesions on the thigh following the dermatomes L2-L4     Assessment/Plan 1. Shingles To start valtrex today and may wait until tomorrow for prednisone  - valACYclovir (VALTREX) 1000 MG tablet; Take 1 tablet (1,000 mg total) by mouth 3 (three) times daily.  Dispense: 27 tablet; Refill: 0 - predniSONE (DELTASONE) 20 MG tablet; Take 1 tablet (20 mg total) by mouth daily.  Dispense: 5 tablet; Refill: 0 Given script for gabapentin (NEURONTIN) 100 MG capsule; Take 1 capsule (100 mg total) by mouth 3 (three) times daily as needed-- and to only get filled if needed for pain Educated patient on risk to individuals who have not had chicken pox; to avoid pregnant women and babies. Will follow up in 1 week to make sure shingles is improving.

## 2013-07-13 NOTE — Patient Instructions (Signed)
To start antiviral to help with shingles- to take 3 times daily for 7 days -- medication sent to pharmacy Will give prednisone 20 mg for 5 days -- medication sent to pharmacy   Gabapentin to be filled if you start having pain  Follow up in 1 week   Shingles Shingles (herpes zoster) is an infection that is caused by the same virus that causes chickenpox (varicella). The infection causes a painful skin rash and fluid-filled blisters, which eventually break open, crust over, and heal. It may occur in any area of the body, but it usually affects only one side of the body or face. The pain of shingles usually lasts about 1 month. However, some people with shingles may develop long-term (chronic) pain in the affected area of the body. Shingles often occurs many years after the person had chickenpox. It is more common:  In people older than 50 years.  In people with weakened immune systems, such as those with HIV, AIDS, or cancer.  In people taking medicines that weaken the immune system, such as transplant medicines.  In people under great stress. CAUSES  Shingles is caused by the varicella zoster virus (VZV), which also causes chickenpox. After a person is infected with the virus, it can remain in the person's body for years in an inactive state (dormant). To cause shingles, the virus reactivates and breaks out as an infection in a nerve root. The virus can be spread from person to person (contagious) through contact with open blisters of the shingles rash. It will only spread to people who have not had chickenpox. When these people are exposed to the virus, they may develop chickenpox. They will not develop shingles. Once the blisters scab over, the person is no longer contagious and cannot spread the virus to others. SYMPTOMS  Shingles shows up in stages. The initial symptoms may be pain, itching, and tingling in an area of the skin. This pain is usually described as burning, stabbing, or  throbbing.In a few days or weeks, a painful red rash will appear in the area where the pain, itching, and tingling were felt. The rash is usually on one side of the body in a band or belt-like pattern. Then, the rash usually turns into fluid-filled blisters. They will scab over and dry up in approximately 2 3 weeks. Flu-like symptoms may also occur with the initial symptoms, the rash, or the blisters. These may include:  Fever.  Chills.  Headache.  Upset stomach. DIAGNOSIS  Your caregiver will perform a skin exam to diagnose shingles. Skin scrapings or fluid samples may also be taken from the blisters. This sample will be examined under a microscope or sent to a lab for further testing. TREATMENT  There is no specific cure for shingles. Your caregiver will likely prescribe medicines to help you manage the pain, recover faster, and avoid long-term problems. This may include antiviral drugs, anti-inflammatory drugs, and pain medicines. HOME CARE INSTRUCTIONS   Take a cool bath or apply cool compresses to the area of the rash or blisters as directed. This may help with the pain and itching.   Only take over-the-counter or prescription medicines as directed by your caregiver.   Rest as directed by your caregiver.  Keep your rash and blisters clean with mild soap and cool water or as directed by your caregiver.  Do not pick your blisters or scratch your rash. Apply an anti-itch cream or numbing creams to the affected area as directed by your caregiver.  Keep your shingles rash covered with a loose bandage (dressing).  Avoid skin contact with:  Babies.   Pregnant women.   Children with eczema.   Elderly people with transplants.   People with chronic illnesses, such as leukemia or AIDS.   Wear loose-fitting clothing to help ease the pain of material rubbing against the rash.  Keep all follow-up appointments with your caregiver.If the area involved is on your face, you may  receive a referral for follow-up to a specialist, such as an eye doctor (ophthalmologist) or an ear, nose, and throat (ENT) doctor. Keeping all follow-up appointments will help you avoid eye complications, chronic pain, or disability.  SEEK IMMEDIATE MEDICAL CARE IF:   You have facial pain, pain around the eye area, or loss of feeling on one side of your face.  You have ear pain or ringing in your ear.  You have loss of taste.  Your pain is not relieved with prescribed medicines.   Your redness or swelling spreads.   You have more pain and swelling.  Your condition is worsening or has changed.   You have a feveror persistent symptoms for more than 2 3 days.  You have a fever and your symptoms suddenly get worse. MAKE SURE YOU:  Understand these instructions.  Will watch your condition.  Will get help right away if you are not doing well or get worse. Document Released: 10/08/2005 Document Revised: 07/02/2012 Document Reviewed: 05/22/2012 [facility] Patient Information 2014 [employer].

## 2013-07-20 ENCOUNTER — Encounter: Payer: Self-pay | Admitting: Nurse Practitioner

## 2013-07-20 ENCOUNTER — Ambulatory Visit (INDEPENDENT_AMBULATORY_CARE_PROVIDER_SITE_OTHER): Payer: Medicare PPO | Admitting: Nurse Practitioner

## 2013-07-20 VITALS — BP 150/68 | HR 73 | Temp 96.1°F | Resp 14 | Ht 59.0 in | Wt 125.6 lb

## 2013-07-20 DIAGNOSIS — B029 Zoster without complications: Secondary | ICD-10-CM

## 2013-07-20 NOTE — Progress Notes (Signed)
Patient ID: Kim Hodges, female   DOB: March 27, 1909, 79 y.o.   MRN: [id number]   No Known Allergies  Chief Complaint  Patient presents with  . Medical Managment of Chronic Issues    To evaluate Shingles    HPI: Patient is a 79 y.o. female seen in the office today for follow up on shingles; shingles have improved greatly and most lesions have dried up; no new lesions noted; Reported she did have trouble with pain a few days ago; got gabapentin Rx filled and has been taking that; overall pain in now minimal.  No fevers or chills. Reports she has been sleepy due to the pain pills.   Review of Systems:  Review of Systems  Constitutional: Negative for fever, chills and malaise/fatigue.  Respiratory: Negative for shortness of breath.   Cardiovascular: Negative for chest pain.  Gastrointestinal: Negative for nausea, vomiting, abdominal pain, diarrhea and constipation.       Medication did cause some stomach upset; and decrease appetite.   Musculoskeletal: Negative for myalgias and joint pain.  Neurological: Negative for dizziness, tingling and weakness.     Past Medical History  Diagnosis Date  . Chronic kidney disease, stage II (mild) 12/25/2012  . Unspecified arthropathy, lower leg 09/11/2011  . Swelling of limb 09/10/2012  . Other and unspecified hyperlipidemia 07/26/2011  . Actinic keratosis 07/26/2011  . Abnormality of gait 07/26/2011  . Other abnormal blood chemistry 07/26/2011  . Tear film insufficiency, unspecified 12/29/2009  . Other malaise and fatigue 12/29/2009  . Unspecified hearing loss 06/30/2009  . Unspecified hypothyroidism 12/15/2008  . Unspecified essential hypertension 12/15/2008  . Unspecified glaucoma(365.9) 12/15/2008    both eyes   Past Surgical History  Procedure Laterality Date  . Small intestine surgery  1957  . Cardiac catheterization  2004  . Gallbladder surgery  2006   Social History:   reports that she has never smoked. She has never used smokeless  tobacco. She reports that she does not drink alcohol or use illicit drugs.  Family History  Problem Relation Age of Onset  . Cancer Sister   . Cancer Brother     pancreatic  . Cancer Son     lung  . Cancer Brother     pancreatic    Medications: Patient's Medications  New Prescriptions   No medications on file  Previous Medications   ACETAMINOPHEN (TYLENOL ARTHRITIS PAIN) 650 MG CR TABLET    Take 650 mg by mouth. Take one tablet every 6 hours as needed for pain   DORZOLAMIDE-TIMOLOL (COSOPT) 22.3-6.8 MG/ML OPHTHALMIC SOLUTION    One drop in both eyes twice daily for glaucoma   HYDROCHLOROTHIAZIDE (MICROZIDE) 12.5 MG CAPSULE    TAKE 1 CAPSULE EVERY DAY FOR BLOOD PRESSURE AND SWELLING   LEVOTHYROXINE (SYNTHROID, LEVOTHROID) 75 MCG TABLET    Take 75 mcg by mouth daily before breakfast.   PREDNISONE (DELTASONE) 20 MG TABLET    Take 1 tablet (20 mg total) by mouth daily.   VALACYCLOVIR (VALTREX) 1000 MG TABLET    Take 1 tablet (1,000 mg total) by mouth 3 (three) times daily.  Modified Medications   No medications on file  Discontinued Medications   ASPIRIN 81 MG TABLET    Take 81 mg by mouth daily. As needed   CALCIUM CARBONATE-VITAMIN D (CALCIUM 600 + D PO)    Take by mouth. Take one tablet daily   GABAPENTIN (NEURONTIN) 100 MG CAPSULE    Take 1 capsule (100 mg total) by mouth  3 (three) times daily.   MULTIPLE VITAMINS-MINERALS (MULTIVITAMIN PO)    Take by mouth. Take one tablet daily   MULTIVITAMIN-LUTEIN (OCUVITE-LUTEIN) CAPS CAPSULE    Take 1 capsule by mouth daily.     Physical Exam:  Filed Vitals:   07/20/13 1439  BP: 150/68  Pulse: 73  Temp: 96.1 F (35.6 C)  TempSrc: Oral  Resp: 14  Height: 4\' 11"  (1.499 m)  Weight: 125 lb 9.6 oz (56.972 kg)    Physical Exam  Constitutional: She is well-developed, well-nourished, and in no distress. No distress.  Cardiovascular: Normal rate and regular rhythm.   Pulmonary/Chest: Effort normal and breath sounds normal.   Abdominal: Soft. Bowel sounds are normal.  Musculoskeletal: Normal range of motion. She exhibits no edema and no tenderness.  Neurological: She is alert.  Skin: Skin is warm and dry. She is not diaphoretic.  Vesicular lesions that have mostly dried and crusted over  on the left leg. Large blisters still remain but have opened on the anterior aspect of the leg that extend around to back with the majority of lesions on the thigh following the dermatomes L2-L4       Assessment/Plan 1. Shingles -treatment complete and shingles have improved  -pain stable and has improved -to cont to monitor for secondary infection of large blister - to have nurse at facility cont to monitor for drainage, edema, redness, or heat and notify us for evaluation

## 2013-07-20 NOTE — Patient Instructions (Addendum)
Shingles are better; monitor blisters to make sure another infection does not occur; monitor for increase in redness; pain; swelling or drainage   Will see you as needed and Keep routine follow up appt with Dr Chilton Si

## 2013-08-02 ENCOUNTER — Emergency Department (HOSPITAL_COMMUNITY): Payer: Medicare PPO

## 2013-08-02 ENCOUNTER — Encounter (HOSPITAL_COMMUNITY): Payer: Self-pay | Admitting: Emergency Medicine

## 2013-08-02 ENCOUNTER — Observation Stay (HOSPITAL_COMMUNITY)
Admission: EM | Admit: 2013-08-02 | Discharge: 2013-08-04 | Disposition: A | Discharge status: Skilled Nursing Facility | Payer: Medicare PPO | Attending: Internal Medicine | Admitting: Internal Medicine

## 2013-08-02 DIAGNOSIS — W19XXXA Unspecified fall, initial encounter: Secondary | ICD-10-CM | POA: Insufficient documentation

## 2013-08-02 DIAGNOSIS — S32009A Unspecified fracture of unspecified lumbar vertebra, initial encounter for closed fracture: Secondary | ICD-10-CM

## 2013-08-02 DIAGNOSIS — R9389 Abnormal findings on diagnostic imaging of other specified body structures: Secondary | ICD-10-CM

## 2013-08-02 DIAGNOSIS — G319 Degenerative disease of nervous system, unspecified: Secondary | ICD-10-CM

## 2013-08-02 DIAGNOSIS — B0229 Other postherpetic nervous system involvement: Secondary | ICD-10-CM

## 2013-08-02 DIAGNOSIS — R531 Weakness: Secondary | ICD-10-CM

## 2013-08-02 DIAGNOSIS — R269 Unspecified abnormalities of gait and mobility: Secondary | ICD-10-CM | POA: Insufficient documentation

## 2013-08-02 DIAGNOSIS — N289 Disorder of kidney and ureter, unspecified: Secondary | ICD-10-CM

## 2013-08-02 DIAGNOSIS — E039 Hypothyroidism, unspecified: Secondary | ICD-10-CM | POA: Insufficient documentation

## 2013-08-02 DIAGNOSIS — M25559 Pain in unspecified hip: Secondary | ICD-10-CM | POA: Insufficient documentation

## 2013-08-02 DIAGNOSIS — N182 Chronic kidney disease, stage 2 (mild): Secondary | ICD-10-CM | POA: Insufficient documentation

## 2013-08-02 DIAGNOSIS — E871 Hypo-osmolality and hyponatremia: Secondary | ICD-10-CM | POA: Insufficient documentation

## 2013-08-02 DIAGNOSIS — E860 Dehydration: Secondary | ICD-10-CM | POA: Diagnosis present

## 2013-08-02 DIAGNOSIS — I679 Cerebrovascular disease, unspecified: Secondary | ICD-10-CM

## 2013-08-02 DIAGNOSIS — Z79899 Other long term (current) drug therapy: Secondary | ICD-10-CM | POA: Insufficient documentation

## 2013-08-02 DIAGNOSIS — R011 Cardiac murmur, unspecified: Secondary | ICD-10-CM | POA: Insufficient documentation

## 2013-08-02 DIAGNOSIS — B029 Zoster without complications: Secondary | ICD-10-CM

## 2013-08-02 DIAGNOSIS — R29898 Other symptoms and signs involving the musculoskeletal system: Principal | ICD-10-CM | POA: Insufficient documentation

## 2013-08-02 DIAGNOSIS — I10 Essential (primary) hypertension: Secondary | ICD-10-CM

## 2013-08-02 DIAGNOSIS — I7090 Unspecified atherosclerosis: Secondary | ICD-10-CM

## 2013-08-02 DIAGNOSIS — I129 Hypertensive chronic kidney disease with stage 1 through stage 4 chronic kidney disease, or unspecified chronic kidney disease: Secondary | ICD-10-CM | POA: Insufficient documentation

## 2013-08-02 DIAGNOSIS — E876 Hypokalemia: Secondary | ICD-10-CM | POA: Insufficient documentation

## 2013-08-02 DIAGNOSIS — Z8619 Personal history of other infectious and parasitic diseases: Secondary | ICD-10-CM | POA: Diagnosis present

## 2013-08-02 HISTORY — DX: Unspecified atherosclerosis: I70.90

## 2013-08-02 HISTORY — DX: Degenerative disease of nervous system, unspecified: G31.9

## 2013-08-02 HISTORY — DX: Cardiac murmur, unspecified: R01.1

## 2013-08-02 HISTORY — DX: Other postherpetic nervous system involvement: B02.29

## 2013-08-02 HISTORY — DX: Weakness: R53.1

## 2013-08-02 HISTORY — DX: Unspecified fracture of unspecified lumbar vertebra, initial encounter for closed fracture: S32.009A

## 2013-08-02 HISTORY — DX: Abnormal findings on diagnostic imaging of other specified body structures: R93.89

## 2013-08-02 HISTORY — DX: Hypo-osmolality and hyponatremia: E87.1

## 2013-08-02 HISTORY — DX: Zoster without complications: B02.9

## 2013-08-02 HISTORY — DX: Cerebrovascular disease, unspecified: I67.9

## 2013-08-02 LAB — COMPREHENSIVE METABOLIC PANEL WITH GFR
ALT: 17 U/L (ref 0–35)
AST: 27 U/L (ref 0–37)
Albumin: 3.7 g/dL (ref 3.5–5.2)
Alkaline Phosphatase: 54 U/L (ref 39–117)
BUN: 34 mg/dL — ABNORMAL HIGH (ref 6–23)
CO2: 26 meq/L (ref 19–32)
Calcium: 9.3 mg/dL (ref 8.4–10.5)
Chloride: 91 meq/L — ABNORMAL LOW (ref 96–112)
Creatinine, Ser: 0.97 mg/dL (ref 0.50–1.10)
GFR calc Af Amer: 53 mL/min — ABNORMAL LOW
GFR calc non Af Amer: 46 mL/min — ABNORMAL LOW
Glucose, Bld: 102 mg/dL — ABNORMAL HIGH (ref 70–99)
Potassium: 3.4 meq/L — ABNORMAL LOW (ref 3.5–5.1)
Sodium: 129 meq/L — ABNORMAL LOW (ref 135–145)
Total Bilirubin: 0.5 mg/dL (ref 0.3–1.2)
Total Protein: 7.2 g/dL (ref 6.0–8.3)

## 2013-08-02 LAB — URINALYSIS, ROUTINE W REFLEX MICROSCOPIC
Bilirubin Urine: NEGATIVE
Glucose, UA: NEGATIVE mg/dL
Hgb urine dipstick: NEGATIVE
Ketones, ur: NEGATIVE mg/dL
Nitrite: NEGATIVE
Protein, ur: NEGATIVE mg/dL
Specific Gravity, Urine: 1.018 (ref 1.005–1.030)
Urobilinogen, UA: 0.2 mg/dL (ref 0.0–1.0)
pH: 5.5 (ref 5.0–8.0)

## 2013-08-02 LAB — DIFFERENTIAL
Basophils Absolute: 0 K/uL (ref 0.0–0.1)
Basophils Relative: 0 % (ref 0–1)
Eosinophils Absolute: 0.1 K/uL (ref 0.0–0.7)
Eosinophils Relative: 1 % (ref 0–5)
Lymphocytes Relative: 20 % (ref 12–46)
Lymphs Abs: 2 K/uL (ref 0.7–4.0)
Monocytes Absolute: 1.2 K/uL — ABNORMAL HIGH (ref 0.1–1.0)
Monocytes Relative: 12 % (ref 3–12)
Neutro Abs: 6.7 K/uL (ref 1.7–7.7)
Neutrophils Relative %: 67 % (ref 43–77)

## 2013-08-02 LAB — POCT I-STAT, CHEM 8
BUN: 32 mg/dL — ABNORMAL HIGH (ref 6–23)
Calcium, Ion: 1.09 mmol/L — ABNORMAL LOW (ref 1.13–1.30)
Creatinine, Ser: 1.2 mg/dL — ABNORMAL HIGH (ref 0.50–1.10)
Glucose, Bld: 103 mg/dL — ABNORMAL HIGH (ref 70–99)
Hemoglobin: 13.3 g/dL (ref 12.0–15.0)
TCO2: 25 mmol/L (ref 0–100)

## 2013-08-02 LAB — POCT I-STAT TROPONIN I: Troponin i, poc: 0.04 ng/mL (ref 0.00–0.08)

## 2013-08-02 LAB — CBC
HCT: 36.3 % (ref 36.0–46.0)
Hemoglobin: 12.4 g/dL (ref 12.0–15.0)
MCH: 29.9 pg (ref 26.0–34.0)
MCHC: 34.2 g/dL (ref 30.0–36.0)
MCV: 87.5 fL (ref 78.0–100.0)
Platelets: 306 K/uL (ref 150–400)
RBC: 4.15 MIL/uL (ref 3.87–5.11)
RDW: 16.4 % — ABNORMAL HIGH (ref 11.5–15.5)
WBC: 10 K/uL (ref 4.0–10.5)

## 2013-08-02 LAB — CK: Total CK: 185 U/L — ABNORMAL HIGH (ref 7–177)

## 2013-08-02 LAB — APTT: aPTT: 27 s (ref 24–37)

## 2013-08-02 LAB — URINE MICROSCOPIC-ADD ON

## 2013-08-02 LAB — ETHANOL: Alcohol, Ethyl (B): <11 mg/dL (ref 0–11)

## 2013-08-02 MED ORDER — GABAPENTIN 100 MG PO CAPS
100.0000 mg | ORAL_CAPSULE | Freq: Three times a day (TID) | ORAL | Status: DC
  Administered 2013-08-02 – 2013-08-04 (×5): 100 mg via ORAL
  Filled 2013-08-02 (×7): qty 1

## 2013-08-02 MED ORDER — LEVOTHYROXINE SODIUM 75 MCG PO TABS
75.0000 ug | ORAL_TABLET | Freq: Every day | ORAL | Status: DC
  Administered 2013-08-03 – 2013-08-04 (×2): 75 ug via ORAL
  Filled 2013-08-02 (×3): qty 1

## 2013-08-02 MED ORDER — ACETAMINOPHEN 650 MG RE SUPP: 650.0000 mg | Freq: Four times a day (QID) | RECTAL | Status: DC | PRN

## 2013-08-02 MED ORDER — SODIUM CHLORIDE 0.9 % IV SOLN
INTRAVENOUS | Status: AC
  Administered 2013-08-02: 22:00:00 via INTRAVENOUS

## 2013-08-02 MED ORDER — DORZOLAMIDE HCL-TIMOLOL MAL 2-0.5 % OP SOLN
1.0000 [drp] | Freq: Two times a day (BID) | OPHTHALMIC | Status: DC
  Administered 2013-08-02 – 2013-08-04 (×4): 1 [drp] via OPHTHALMIC
  Filled 2013-08-02: qty 10

## 2013-08-02 MED ORDER — ASPIRIN EC 325 MG PO TBEC
325.0000 mg | DELAYED_RELEASE_TABLET | Freq: Every day | ORAL | Status: DC
  Administered 2013-08-02 – 2013-08-04 (×3): 325 mg via ORAL
  Filled 2013-08-02 (×3): qty 1

## 2013-08-02 MED ORDER — ACETAMINOPHEN 325 MG PO TABS
650.0000 mg | ORAL_TABLET | Freq: Four times a day (QID) | ORAL | Status: DC | PRN
  Administered 2013-08-04: 650 mg via ORAL
  Filled 2013-08-02: qty 2

## 2013-08-02 NOTE — H&P (Signed)
PCP:   [name], MD   Chief Complaint:  Left leg weakness  HPI: 79 yo pleasant female lives in independent living center walks with a walker, h/o htn, glaucoma, mild ckd was diagnosed with shingles outbreak about 2.5 weeks ago.  Given prednisone and antiviral which she completed both courses.  Still has rash that is scabbed over to her left thigh mainly inner thigh and over the last several days she cannot walk on this leg, it gets weak and gives out on her , she says the thigh where the rash is has a lot of burning, tingling and numbness.  Only above the knee where the rash is.  She was prescribed neurontin which she has been taking and says helps a lot with the pain.  Today her son found her on the floor several times, after a fall and then she didn't have the strength to get back up.  No fevers.  No n/v/d.  She has had decreased po intake over the last week b/c of loss of appetite.  No confusion.  No slurred speech.  No weakness in other extremities.  No facial drooping.  She already takes a baby asa a day.  No cough.  No cp.  No le edema or swelling.  No dysuria.  No hip or pelvic pain.  Review of Systems:  Positive and negative as per HPI otherwise all other systems are negative  Past Medical History: Past Medical History  Diagnosis Date  . Chronic kidney disease, stage II (mild) 12/25/2012  . Unspecified arthropathy, lower leg 09/11/2011  . Swelling of limb 09/10/2012  . Other and unspecified hyperlipidemia 07/26/2011  . Actinic keratosis 07/26/2011  . Abnormality of gait 07/26/2011  . Other abnormal blood chemistry 07/26/2011  . Tear film insufficiency, unspecified 12/29/2009  . Other malaise and fatigue 12/29/2009  . Unspecified hearing loss 06/30/2009  . Unspecified hypothyroidism 12/15/2008  . Unspecified essential hypertension 12/15/2008  . Unspecified glaucoma(365.9) 12/15/2008    both eyes   Past Surgical History  Procedure Laterality Date  . Small intestine surgery  1957   . Cardiac catheterization  2004  . Gallbladder surgery  2006    Medications: Prior to Admission medications   Medication Sig Start Date End Date Taking? Authorizing Provider  acetaminophen (TYLENOL ARTHRITIS PAIN) 650 MG CR tablet Take 650 mg by mouth. Take one tablet every 6 hours as needed for pain   Yes Historical Provider, MD  Calcium Carbonate-Vitamin D (CALCIUM 600 + D PO) Take 1 tablet by mouth daily.   Yes Historical Provider, MD  dorzolamide-timolol (COSOPT) 22.3-6.8 MG/ML ophthalmic solution Place 1 drop into both eyes 2 (two) times daily.  05/31/13  Yes Historical Provider, MD  gabapentin (NEURONTIN) 100 MG capsule Take 100 mg by mouth 3 (three) times daily. 07/31/13  Yes Historical Provider, MD  hydrochlorothiazide (MICROZIDE) 12.5 MG capsule Take 12.5 mg by mouth daily.   Yes Historical Provider, MD  levothyroxine (SYNTHROID, LEVOTHROID) 75 MCG tablet Take 75 mcg by mouth daily before breakfast.   Yes Historical Provider, MD  Specialty Vitamins Products (ICAPS LUTEIN-ZEAXANTHIN PO) Take 1 tablet by mouth daily.   Yes Historical Provider, MD  predniSONE (DELTASONE) 20 MG tablet Take 20 mg by mouth daily.    Historical Provider, MD  valACYclovir (VALTREX) 1000 MG tablet Take 1,000 mg by mouth 3 (three) times daily.    Historical Provider, MD    Allergies:  No Known Allergies  Social History:  reports that she has never  smoked. She has never used smokeless tobacco. She reports that she does not drink alcohol or use illicit drugs.  Family History: Family History  Problem Relation Age of Onset  . Cancer Sister   . Cancer Brother     pancreatic  . Cancer Son     lung  . Cancer Brother     pancreatic    Physical Exam: Filed Vitals:   08/02/13 1430 08/02/13 1712 08/02/13 1730 08/02/13 1944  BP: 147/55  172/71 143/52  Pulse: 85  88 87  Temp: 98.5 F (36.9 C) 98.5 F (36.9 C)    TempSrc: Oral     Resp: 16  21 18   SpO2: 99%  100% 99%   General appearance: alert,  cooperative and no distress Head: Normocephalic, without obvious abnormality, atraumatic Eyes: negative Nose: Nares normal. Septum midline. Mucosa normal. No drainage or sinus tenderness. Neck: no JVD and supple, symmetrical, trachea midline Lungs: clear to auscultation bilaterally Heart: regular rate and rhythm and systolic murmur: early systolic 3/6, blowing at 2nd left intercostal space Abdomen: soft, non-tender; bowel sounds normal; no masses,  no organomegaly Extremities: extremities normal, atraumatic, no cyanosis or edema Pulses: 2+ and symmetric Skin: Skin color, texture, turgor normal. Shingles rash to left thigh scabbed over well healing Neurologic: Mental status: Alert, oriented, thought content appropriate Cranial nerves: normal strength decreased in lle 4/5  Normal in all others   Labs on Admission:   Recent Labs  08/02/13 1630 08/02/13 1644  NA 129* 129*  K 3.4* 4.3  CL 91* 94*  CO2 26  --   GLUCOSE 102* 103*  BUN 34* 32*  CREATININE 0.97 1.20*  CALCIUM 9.3  --     Recent Labs  08/02/13 1630  AST 27  ALT 17  ALKPHOS 54  BILITOT 0.5  PROT 7.2  ALBUMIN 3.7    Recent Labs  08/02/13 1630 08/02/13 1644  WBC 10.0  --   NEUTROABS 6.7  --   HGB 12.4 13.3  HCT 36.3 39.0  MCV 87.5  --   PLT 306  --     Recent Labs  08/02/13 1630  TROPONINI <0.30   Radiological Exams on Admission: Dg Chest 2 View  08/02/2013   CLINICAL DATA:  History of fall complaining of low back pain and left hip pain.  EXAM: CHEST  2 VIEW  COMPARISON:  CHEST x-ray 07/26/2005.  FINDINGS: Lung volumes are low. Mild diffuse peribronchial cuffing. Diffuse interstitial prominence. No evidence of pulmonary edema. No pleural effusions. No acute consolidative airspace disease. 2.2 x 1.3 cm nodular opacity in the left mid to upper lung projecting inferior to the anterior aspect of the left 2nd rib, new compared to the prior study. Heart size is borderline enlarged. Severe mitral annular  calcifications. Upper mediastinal contours are within normal limits. Atherosclerosis in the thoracic aorta. Status post cholecystectomy.  IMPRESSION: 1. Mild diffuse peribronchial cuffing and interstitial prominence. Although some of these findings are chronic, the extent of peribronchial cuffing is greater than previously seen, which may suggest an acute bronchitis. 2. New nodular opacity in the left mid to upper lung. Correlation with nonemergent chest CT is recommended to exclude underlying neoplasm. 3. Atherosclerosis. 4. Severe mitral annular calcifications.   Electronically Signed   By: Trudie Reed M.D.   On: 08/02/2013 16:36   Dg Lumbar Spine Complete  08/02/2013   CLINICAL DATA:  Fall. Low back pain.  EXAM: LUMBAR SPINE - COMPLETE 4+ VIEW  COMPARISON:  CT abdomen and  pelvis 03/04/2006 and chest in two views abdomen 12/30/2006.  FINDINGS: There is marked convex left scoliosis and severe multilevel degenerative disease. A remote inferior endplate compression fracture of L1 is identified. No acute fracture is seen. Paraspinous structures are unremarkable.  IMPRESSION: No acute finding.  Remote L1 inferior endplate compression fracture.  Convex right scoliosis and severe multilevel degenerative change.   Electronically Signed   By: Drusilla Kanner M.D.   On: 08/02/2013 16:27   Dg Hip Complete Left  08/02/2013   CLINICAL DATA:  Left hip pain after fall.  EXAM: LEFT HIP - COMPLETE 2+ VIEW  COMPARISON:  None.  FINDINGS: There is no evidence of hip fracture or dislocation. There is no evidence of arthropathy or other focal bone abnormality.  IMPRESSION: Normal left hip.   Electronically Signed   By: Roque Lias M.D.   On: 08/02/2013 16:26   Ct Head Wo Contrast  08/02/2013   CLINICAL DATA:  Left leg weakness.  EXAM: CT HEAD WITHOUT CONTRAST  TECHNIQUE: Contiguous axial images were obtained from the base of the skull through the vertex without intravenous contrast.  COMPARISON:  Brain MRI  08/05/2005.  FINDINGS: There is some cortical atrophy and chronic microvascular ischemic change. No evidence of acute intracranial abnormality including infarction, hemorrhage, mass lesion, mass effect, midline shift or abnormal extra-axial fluid collection is identified. No hydrocephalus or pneumocephalus.  IMPRESSION: No acute finding. Mild atrophy and chronic microvascular ischemic change.   Electronically Signed   By: Drusilla Kanner M.D.   On: 08/02/2013 16:09    Assessment/Plan  79 yo female with left leg weakness likely due to shingles rash but cannot definitely r/o tia/cva also with mild dehydration  Principal Problem:   Weakness - focal to left thigh area, which is where rash is.  Pt and only son are present, and they would like to defer any cva w/u at this time unless symptoms worsen.  Agree to gentle ivf overnight and PT evaluation in the morning.  She feels her strength has already improved since arrival.  Will ck cpk level.  Neurology consult has also been called by ED and is pending.  Hopefully she will improve with hydration, also encouraged her to use the neurontin which has been helping with the pain and is not causing oversedation.  Has a systolic murmur, which she was told at one time she had and then went away.  Probably some degree of aortic stenosis, will defer to pcp.  Will increase her baby asa to full dose.  freq neuro cks overnight.  Active Problems:   Chronic kidney disease, stage II (mild)   Abnormality of gait   Unspecified essential hypertension   Dehydration   Hyponatremia   Renal insufficiency, mild   Shingles    Kamayah Pillay A 08/02/2013, 7:47 PM

## 2013-08-02 NOTE — ED Notes (Signed)
Pt here with her daughter-in-law. Daughter-in-law states pt usually able to ambulate on own with walker. Last seen ambulating was yesterday afternoon. Pt reports minor pain to left thigh where shingles present - states she feels more of a "discomfort" than pain. Left foot dorsiflexion and plantar flexion weaker than right. Denies numbness or tingling to leg. Is a+ox4, in nad

## 2013-08-02 NOTE — ED Notes (Signed)
Pt transported to CT ?

## 2013-08-02 NOTE — ED Notes (Signed)
[facility] from [facility]. Pt c/o L leg weakness and having to "sit down on floor two times today to keep from falling." Pt reports that she has had shingles on her L thigh x 3 weeks. Pt is unable to lift L leg off bed,. Pt is A&O and in NAD

## 2013-08-02 NOTE — ED Provider Notes (Signed)
CSN: [id number]     Arrival date & time 08/02/13  1425 History   First MD Initiated Contact with Patient 08/02/13 1509     Chief Complaint  Patient presents with  . Fall  . Weakness   (Consider location/radiation/quality/duration/timing/severity/associated sxs/prior Treatment) Patient is a 79 y.o. female presenting with fall and weakness. The history is provided by the patient and a relative.  Fall Pertinent negatives include no chest pain, no abdominal pain, no headaches and no shortness of breath.  Weakness Pertinent negatives include no chest pain, no abdominal pain, no headaches and no shortness of breath.   patient's had weakness and 2 falls earlier today. She reportedly did not injure herself on the falls. She states her left leg discomfort. She currently has zoster on her leg. She's had for the last 3 weeks. She recently finished up steroids and valacyclovir. No fevers. No cough. No nausea vomiting diarrhea. No dysuria. No chest pain. No back pain. No numbness.  Past Medical History  Diagnosis Date  . Chronic kidney disease, stage II (mild) 12/25/2012  . Unspecified arthropathy, lower leg 09/11/2011  . Swelling of limb 09/10/2012  . Other and unspecified hyperlipidemia 07/26/2011  . Actinic keratosis 07/26/2011  . Abnormality of gait 07/26/2011  . Other abnormal blood chemistry 07/26/2011  . Tear film insufficiency, unspecified 12/29/2009  . Other malaise and fatigue 12/29/2009  . Unspecified hearing loss 06/30/2009  . Unspecified hypothyroidism 12/15/2008  . Unspecified essential hypertension 12/15/2008  . Unspecified glaucoma(365.9) 12/15/2008    both eyes   Past Surgical History  Procedure Laterality Date  . Small intestine surgery  1957  . Cardiac catheterization  2004  . Gallbladder surgery  2006  . Cholecystectomy     Family History  Problem Relation Age of Onset  . Cancer Sister   . Cancer Brother     pancreatic  . Cancer Son     lung  . Cancer Brother      pancreatic   History  Substance Use Topics  . Smoking status: Never Smoker   . Smokeless tobacco: Never Used  . Alcohol Use: No   OB History   Grav Para Term Preterm Abortions TAB SAB Ect Mult Living                 Review of Systems  Constitutional: Negative for activity change and appetite change.  Eyes: Negative for pain.  Respiratory: Negative for chest tightness and shortness of breath.   Cardiovascular: Negative for chest pain and leg swelling.  Gastrointestinal: Negative for nausea, vomiting, abdominal pain and diarrhea.  Genitourinary: Negative for flank pain.  Musculoskeletal: Negative for back pain and neck stiffness.  Skin: Positive for rash.  Neurological: Positive for weakness. Negative for numbness and headaches.  Psychiatric/Behavioral: Negative for behavioral problems.    Allergies  Review of patient's allergies indicates no known allergies.  Home Medications   No current outpatient prescriptions on file. BP 129/50  Pulse 74  Temp(Src) 97.8 F (36.6 C) (Oral)  Resp 18  Ht 4\' 11"  (1.499 m)  Wt 119 lb 14.4 oz (54.386 kg)  BMI 24.2 kg/m2  SpO2 100% Physical Exam  Nursing note and vitals reviewed. Constitutional: She is oriented to person, place, and time. She appears well-developed and well-nourished.  HENT:  Head: Normocephalic and atraumatic.  Eyes: EOM are normal. Pupils are equal, round, and reactive to light.  Neck: Normal range of motion. Neck supple.  Cardiovascular: Normal rate, regular rhythm and normal heart sounds.  No murmur heard. Pulmonary/Chest: Effort normal and breath sounds normal. No respiratory distress. She has no wheezes. She has no rales.  Abdominal: Soft. Bowel sounds are normal. She exhibits no distension. There is no tenderness. There is no rebound and no guarding.  Musculoskeletal: Normal range of motion.  Neurological: She is alert and oriented to person, place, and time. No cranial nerve deficit.  4+ out of 5 strength on  left lower leg. Sensation grossly intact. Good straight leg raise on right. She's able  to raise left leg, but with less strength. good grip strength bilaterally in upper extremities. Good flexion and extension at elbows. Equal bilaterally.   Skin: Skin is warm and dry. Rash noted.  Zoster down medial left upper leg. healing zoster lateral left upper leg.  Psychiatric: She has a normal mood and affect. Her speech is normal.    ED Course  Procedures (including critical care time) Labs Review Labs Reviewed  MRSA PCR SCREENING - Abnormal; Notable for the following:    MRSA by PCR INVALID RESULTS, SPECIMEN SENT FOR CULTURE (*)    All other components within normal limits  CBC - Abnormal; Notable for the following:    RDW 16.4 (*)    All other components within normal limits  DIFFERENTIAL - Abnormal; Notable for the following:    Monocytes Absolute 1.2 (*)    All other components within normal limits  COMPREHENSIVE METABOLIC PANEL - Abnormal; Notable for the following:    Sodium 129 (*)    Potassium 3.4 (*)    Chloride 91 (*)    Glucose, Bld 102 (*)    BUN 34 (*)    GFR calc non Af Amer 46 (*)    GFR calc Af Amer 53 (*)    All other components within normal limits  URINALYSIS, ROUTINE W REFLEX MICROSCOPIC - Abnormal; Notable for the following:    Leukocytes, UA SMALL (*)    All other components within normal limits  BASIC METABOLIC PANEL - Abnormal; Notable for the following:    Sodium 129 (*)    Potassium 3.2 (*)    Glucose, Bld 112 (*)    BUN 29 (*)    GFR calc non Af Amer 48 (*)    GFR calc Af Amer 56 (*)    All other components within normal limits  CBC - Abnormal; Notable for the following:    RBC 3.72 (*)    Hemoglobin 11.2 (*)    HCT 32.7 (*)    RDW 16.4 (*)    All other components within normal limits  CK - Abnormal; Notable for the following:    Total CK 185 (*)    All other components within normal limits  POCT I-STAT, CHEM 8 - Abnormal; Notable for the following:     Sodium 129 (*)    Chloride 94 (*)    BUN 32 (*)    Creatinine, Ser 1.20 (*)    Glucose, Bld 103 (*)    Calcium, Ion 1.09 (*)    All other components within normal limits  MRSA CULTURE  MRSA PCR SCREENING  ETHANOL  PROTIME-INR  APTT  TROPONIN I  URINE MICROSCOPIC-ADD ON  TSH  POCT I-STAT TROPONIN I   Imaging Review Dg Chest 2 View  08/02/2013   CLINICAL DATA:  History of fall complaining of low back pain and left hip pain.  EXAM: CHEST  2 VIEW  COMPARISON:  CHEST x-ray 07/26/2005.  FINDINGS: Lung volumes are low. Mild diffuse  peribronchial cuffing. Diffuse interstitial prominence. No evidence of pulmonary edema. No pleural effusions. No acute consolidative airspace disease. 2.2 x 1.3 cm nodular opacity in the left mid to upper lung projecting inferior to the anterior aspect of the left 2nd rib, new compared to the prior study. Heart size is borderline enlarged. Severe mitral annular calcifications. Upper mediastinal contours are within normal limits. Atherosclerosis in the thoracic aorta. Status post cholecystectomy.  IMPRESSION: 1. Mild diffuse peribronchial cuffing and interstitial prominence. Although some of these findings are chronic, the extent of peribronchial cuffing is greater than previously seen, which may suggest an acute bronchitis. 2. New nodular opacity in the left mid to upper lung. Correlation with nonemergent chest CT is recommended to exclude underlying neoplasm. 3. Atherosclerosis. 4. Severe mitral annular calcifications.   Electronically Signed   By: Trudie Reed M.D.   On: 08/02/2013 16:36   Dg Lumbar Spine Complete  08/02/2013   CLINICAL DATA:  Fall. Low back pain.  EXAM: LUMBAR SPINE - COMPLETE 4+ VIEW  COMPARISON:  CT abdomen and pelvis 03/04/2006 and chest in two views abdomen 12/30/2006.  FINDINGS: There is marked convex left scoliosis and severe multilevel degenerative disease. A remote inferior endplate compression fracture of L1 is identified. No acute  fracture is seen. Paraspinous structures are unremarkable.  IMPRESSION: No acute finding.  Remote L1 inferior endplate compression fracture.  Convex right scoliosis and severe multilevel degenerative change.   Electronically Signed   By: Drusilla Kanner M.D.   On: 08/02/2013 16:27   Dg Hip Complete Left  08/02/2013   CLINICAL DATA:  Left hip pain after fall.  EXAM: LEFT HIP - COMPLETE 2+ VIEW  COMPARISON:  None.  FINDINGS: There is no evidence of hip fracture or dislocation. There is no evidence of arthropathy or other focal bone abnormality.  IMPRESSION: Normal left hip.   Electronically Signed   By: Roque Lias M.D.   On: 08/02/2013 16:26   Ct Head Wo Contrast  08/02/2013   CLINICAL DATA:  Left leg weakness.  EXAM: CT HEAD WITHOUT CONTRAST  TECHNIQUE: Contiguous axial images were obtained from the base of the skull through the vertex without intravenous contrast.  COMPARISON:  Brain MRI 08/05/2005.  FINDINGS: There is some cortical atrophy and chronic microvascular ischemic change. No evidence of acute intracranial abnormality including infarction, hemorrhage, mass lesion, mass effect, midline shift or abnormal extra-axial fluid collection is identified. No hydrocephalus or pneumocephalus.  IMPRESSION: No acute finding. Mild atrophy and chronic microvascular ischemic change.   Electronically Signed   By: Drusilla Kanner M.D.   On: 08/02/2013 16:09    Date: 08/02/2013  Rate: 82  Rhythm: normal sinus rhythm  QRS Axis: normal  Intervals: normal  ST/T Wave abnormalities: normal  Conduction Disutrbances: none  Narrative Interpretation: unremarkable      EKG Interpretation     Ventricular Rate:    PR Interval:    QRS Duration:   QT Interval:    QTC Calculation:   R Axis:     Text Interpretation:              MDM   1. Lower extremity weakness   2. Dehydration   3. Hyponatremia   4. Renal insufficiency, mild   5. Shingles   6. Chronic kidney disease, stage II (mild)    7. Unspecified essential hypertension    Patient with left leg weakness. Mild hyponatremia. CT reasuring. Will admit for futher workup    Juliet Rude. Rubin Payor, MD 08/03/13  1521 

## 2013-08-02 NOTE — ED Notes (Signed)
MD at bedside. 

## 2013-08-03 ENCOUNTER — Encounter: Payer: Self-pay | Admitting: Internal Medicine

## 2013-08-03 ENCOUNTER — Encounter (HOSPITAL_COMMUNITY): Payer: Self-pay

## 2013-08-03 DIAGNOSIS — E876 Hypokalemia: Secondary | ICD-10-CM | POA: Diagnosis present

## 2013-08-03 LAB — BASIC METABOLIC PANEL
CO2: 25 mEq/L (ref 19–32)
Chloride: 96 mEq/L (ref 96–112)
Creatinine, Ser: 0.93 mg/dL (ref 0.50–1.10)
Glucose, Bld: 112 mg/dL — ABNORMAL HIGH (ref 70–99)
Potassium: 3.2 mEq/L — ABNORMAL LOW (ref 3.5–5.1)
Sodium: 129 mEq/L — ABNORMAL LOW (ref 135–145)

## 2013-08-03 LAB — MRSA PCR SCREENING: MRSA by PCR: NEGATIVE

## 2013-08-03 LAB — CBC
HCT: 32.7 % — ABNORMAL LOW (ref 36.0–46.0)
Hemoglobin: 11.2 g/dL — ABNORMAL LOW (ref 12.0–15.0)
MCH: 30.1 pg (ref 26.0–34.0)
MCV: 87.9 fL (ref 78.0–100.0)
RBC: 3.72 MIL/uL — ABNORMAL LOW (ref 3.87–5.11)

## 2013-08-03 MED ORDER — POTASSIUM CHLORIDE CRYS ER 20 MEQ PO TBCR
40.0000 meq | EXTENDED_RELEASE_TABLET | Freq: Once | ORAL | Status: AC
  Administered 2013-08-03: 40 meq via ORAL
  Filled 2013-08-03: qty 2

## 2013-08-03 NOTE — Evaluation (Signed)
Physical Therapy Evaluation Patient Details Name: Kim Hodges MRN: [id number] DOB: Jun 15, 1909 Today's Date: 08/03/2013 Time: 4098-1191 PT Time Calculation (min): 18 min  PT Assessment / Plan / Recommendation History of Present Illness  79 yo pleasant female lives in independent living center walks with a walker, h/o htn, glaucoma, mild ckd was diagnosed with shingles outbreak about 2.5 weeks ago.  Given prednisone and antiviral which she completed both courses.  Still has rash that is scabbed over to her left thigh mainly inner thigh and over the last several days she cannot walk on this leg, it gets weak and gives out on her , she says the thigh where the rash is has a lot of burning, tingling and numbness.  Only above the knee where the rash is.  She was prescribed neurontin which she has been taking and says helps a lot with the pain.  Today her son found her on the floor several times, after a fall and then she didn't have the strength to get back up.  No fevers.  No n/v/d.  She has had decreased po intake over the last week b/c of loss of appetite.  No confusion.  No slurred speech.  No weakness in other extremities.  No facial drooping.  She already takes a baby asa a day.  No cough.  No cp.  No le edema or swelling.  No dysuria.  No hip or pelvic pain.  Clinical Impression  On eval, pt required Min guard assist for mobility-able to ambulate ~50 feet with RW before fatigued. Demonstrates general weakness and decreased activity tolerance. Both pt and son feel there is a need for ST rehab-therapist agrees with this. Recommend ST [facility] at [facility] prior to returning to ILF alone. May need to discuss transition to ALF sometime in near future.     PT Assessment  Patient needs continued PT services    Follow Up Recommendations  SNF (at [facility] for short term rehab before return to ILF)    Does the patient have the potential to tolerate intense rehabilitation      Barriers to  Discharge        Equipment Recommendations  None recommended by PT    Recommendations for Other Services OT consult   Frequency Min 3X/week    Precautions / Restrictions Precautions Precautions: Fall Precaution Comments: recent hx of shingles Restrictions Weight Bearing Restrictions: No   Pertinent Vitals/Pain L thigh area-unrated.       Mobility  Bed Mobility Bed Mobility: Supine to Sit Supine to Sit: HOB elevated;With rails;6: Modified independent (Device/Increase time) Transfers Transfers: Sit to Stand;Stand to Sit Sit to Stand: 4: Min guard;From bed Stand to Sit: 4: Min guard;To chair/3-in-1 Ambulation/Gait Ambulation/Gait Assistance: 4: Min guard Ambulation Distance (Feet): 50 Feet Assistive device: Rolling walker Ambulation/Gait Assistance Details: close guard for safety. Pt fatigues fairly easily-requested return back to room.  Gait Pattern: Step-through pattern    Exercises     PT Diagnosis: Difficulty walking;Generalized weakness;Acute pain  PT Problem List: Decreased strength;Decreased range of motion;Decreased activity tolerance;Decreased mobility;Decreased balance;Pain PT Treatment Interventions: DME instruction;Gait training;Functional mobility training;Therapeutic activities;Therapeutic exercise;Patient/family education     PT Goals(Current goals can be found in the care plan section) Acute Rehab PT Goals Patient Stated Goal: regain independence PT Goal Formulation: With patient/family Time For Goal Achievement: 08/17/13 Potential to Achieve Goals: Good  Visit Information  Last PT Received On: 08/03/13 Assistance Needed: +1 History of Present Illness: 79 yo pleasant female lives in  independent living center walks with a walker, h/o htn, glaucoma, mild ckd was diagnosed with shingles outbreak about 2.5 weeks ago.  Given prednisone and antiviral which she completed both courses.  Still has rash that is scabbed over to her left thigh mainly inner thigh  and over the last several days she cannot walk on this leg, it gets weak and gives out on her , she says the thigh where the rash is has a lot of burning, tingling and numbness.  Only above the knee where the rash is.  She was prescribed neurontin which she has been taking and says helps a lot with the pain.  Today her son found her on the floor several times, after a fall and then she didn't have the strength to get back up.  No fevers.  No n/v/d.  She has had decreased po intake over the last week b/c of loss of appetite.  No confusion.  No slurred speech.  No weakness in other extremities.  No facial drooping.  She already takes a baby asa a day.  No cough.  No cp.  No le edema or swelling.  No dysuria.  No hip or pelvic pain.       Prior Functioning  Home Living Family/patient expects to be discharged to:: Skilled nursing facility Living Arrangements: Alone Type of Home: Independent living facility Home Access: Level entry Home Layout: One level Home Equipment: Walker - 4 wheels Prior Function Level of Independence: Independent with assistive device(s) Communication Communication: No difficulties    Cognition  Cognition Arousal/Alertness: Awake/Hodges Behavior During Therapy: WFL for tasks assessed/performed Overall Cognitive Status: Within Functional Limits for tasks assessed    Extremity/Trunk Assessment Upper Extremity Assessment Upper Extremity Assessment: Generalized weakness Lower Extremity Assessment Lower Extremity Assessment: Generalized weakness Cervical / Trunk Assessment Cervical / Trunk Assessment: Normal   Balance    End of Session PT - End of Session Equipment Utilized During Treatment: Gait belt Activity Tolerance: Patient tolerated treatment well Patient left: in chair;with call bell/phone within reach;with family/visitor present  GP     Kim Hodges, MPT Pager: [phone]

## 2013-08-03 NOTE — Progress Notes (Signed)
[facility] PROGRESS NOTE  Kim Hodges ZOX:[id number] DOB: 06-06-1909 DOA: 08/02/2013 PCP: [name], MD  HPI: 79 yo pleasant female lives in independent living center walks with a walker, h/o htn, glaucoma, mild ckd was diagnosed with shingles outbreak about 2.5 weeks ago. Given prednisone and antiviral which she completed both courses. Still has rash that is scabbed over to her left thigh mainly inner thigh and over the last several days she cannot walk on this leg, it gets weak and gives out on her , she says the thigh where the rash is has a lot of burning, tingling and numbness. Only above the knee where the rash is. She was prescribed neurontin which she has been taking and says helps a lot with the pain. Today her son found her on the floor several times, after a fall and then she didn't have the strength to get back up.  Assessment/Plan: Weakness - focal to left thigh area, which is where rash is.  - already feeling better. Will work with PT.  Active Problems:  Chronic kidney disease, stage II (mild) - stable Abnormality of gait - PT consult pending Unspecified essential hypertension  Dehydration  Hyponatremia - hold HCTZ Renal insufficiency, mild  Shingles Hypokalemia  Code Status: Full Family Communication: none  Disposition Plan: home when ready, 1-2 days  Consultants:  none  Procedures:  none  HPI/Subjective: - no complaints, feeling better this morning  Objective: Filed Vitals:   08/02/13 2118 08/02/13 2119 08/02/13 2120 08/03/13 0538  BP: 162/70 166/69 167/69 135/61  Pulse: 92 92 92 70  Temp:    97.9 F (36.6 C)  TempSrc:    Oral  Resp:    18  Height:      Weight:      SpO2:    98%    Intake/Output Summary (Last 24 hours) at 08/03/13 0853 Last data filed at 08/03/13 8119  Gross per 24 hour  Intake      0 ml  Output    251 ml  Net   -251 ml   Filed Weights   08/02/13 2050  Weight: 54.386 kg (119 lb 14.4 oz)     Exam:   General:  NAD  Cardiovascular: regular rate and rhythm, without MRG  Respiratory: good air movement, clear to auscultation throughout, no wheezing, ronchi or rales  Abdomen: soft, not tender to palpation, positive bowel sounds  MSK: no peripheral edema  Neuro: CN 2-12 grossly intact, MS 5/5 in all 4  Data Reviewed: Basic Metabolic Panel:  Recent Labs Lab 08/02/13 1630 08/02/13 1644 08/03/13 0532  NA 129* 129* 129*  K 3.4* 4.3 3.2*  CL 91* 94* 96  CO2 26  --  25  GLUCOSE 102* 103* 112*  BUN 34* 32* 29*  CREATININE 0.97 1.20* 0.93  CALCIUM 9.3  --  8.5   Liver Function Tests:  Recent Labs Lab 08/02/13 1630  AST 27  ALT 17  ALKPHOS 54  BILITOT 0.5  PROT 7.2  ALBUMIN 3.7   CBC:  Recent Labs Lab 08/02/13 1630 08/02/13 1644 08/03/13 0532  WBC 10.0  --  7.2  NEUTROABS 6.7  --   --   HGB 12.4 13.3 11.2*  HCT 36.3 39.0 32.7*  MCV 87.5  --  87.9  PLT 306  --  243   Cardiac Enzymes:  Recent Labs Lab 08/02/13 1630  CKTOTAL 185*  TROPONINI <0.30    Recent Results (from the past 240 hour(s))  MRSA PCR  SCREENING     Status: Abnormal   Collection Time    08/03/13  3:26 AM      Result Value Range Status   MRSA by PCR INVALID RESULTS, SPECIMEN SENT FOR CULTURE (*) NEGATIVE Final   Comment: CALLED TO J.ELMORE RN AT 0825 ON 10.13.14 BY SHUEA                The GeneXpert MRSA Assay (FDA     approved for NASAL specimens     only), is one component of a     comprehensive MRSA colonization     surveillance program. It is not     intended to diagnose MRSA     infection nor to guide or     monitor treatment for     MRSA infections.     Studies: Dg Chest 2 View  08/02/2013   CLINICAL DATA:  History of fall complaining of low back pain and left hip pain.  EXAM: CHEST  2 VIEW  COMPARISON:  CHEST x-ray 07/26/2005.  FINDINGS: Lung volumes are low. Mild diffuse peribronchial cuffing. Diffuse interstitial prominence. No evidence of pulmonary  edema. No pleural effusions. No acute consolidative airspace disease. 2.2 x 1.3 cm nodular opacity in the left mid to upper lung projecting inferior to the anterior aspect of the left 2nd rib, new compared to the prior study. Heart size is borderline enlarged. Severe mitral annular calcifications. Upper mediastinal contours are within normal limits. Atherosclerosis in the thoracic aorta. Status post cholecystectomy.  IMPRESSION: 1. Mild diffuse peribronchial cuffing and interstitial prominence. Although some of these findings are chronic, the extent of peribronchial cuffing is greater than previously seen, which may suggest an acute bronchitis. 2. New nodular opacity in the left mid to upper lung. Correlation with nonemergent chest CT is recommended to exclude underlying neoplasm. 3. Atherosclerosis. 4. Severe mitral annular calcifications.   Electronically Signed   By: Trudie Reed M.D.   On: 08/02/2013 16:36   Dg Lumbar Spine Complete  08/02/2013   CLINICAL DATA:  Fall. Low back pain.  EXAM: LUMBAR SPINE - COMPLETE 4+ VIEW  COMPARISON:  CT abdomen and pelvis 03/04/2006 and chest in two views abdomen 12/30/2006.  FINDINGS: There is marked convex left scoliosis and severe multilevel degenerative disease. A remote inferior endplate compression fracture of L1 is identified. No acute fracture is seen. Paraspinous structures are unremarkable.  IMPRESSION: No acute finding.  Remote L1 inferior endplate compression fracture.  Convex right scoliosis and severe multilevel degenerative change.   Electronically Signed   By: Drusilla Kanner M.D.   On: 08/02/2013 16:27   Dg Hip Complete Left  08/02/2013   CLINICAL DATA:  Left hip pain after fall.  EXAM: LEFT HIP - COMPLETE 2+ VIEW  COMPARISON:  None.  FINDINGS: There is no evidence of hip fracture or dislocation. There is no evidence of arthropathy or other focal bone abnormality.  IMPRESSION: Normal left hip.   Electronically Signed   By: Roque Lias M.D.   On:  08/02/2013 16:26   Ct Head Wo Contrast  08/02/2013   CLINICAL DATA:  Left leg weakness.  EXAM: CT HEAD WITHOUT CONTRAST  TECHNIQUE: Contiguous axial images were obtained from the base of the skull through the vertex without intravenous contrast.  COMPARISON:  Brain MRI 08/05/2005.  FINDINGS: There is some cortical atrophy and chronic microvascular ischemic change. No evidence of acute intracranial abnormality including infarction, hemorrhage, mass lesion, mass effect, midline shift or abnormal extra-axial fluid collection is  identified. No hydrocephalus or pneumocephalus.  IMPRESSION: No acute finding. Mild atrophy and chronic microvascular ischemic change.   Electronically Signed   By: Drusilla Kanner M.D.   On: 08/02/2013 16:09    Scheduled Meds: . aspirin EC  325 mg Oral Daily  . dorzolamide-timolol  1 drop Both Eyes BID  . gabapentin  100 mg Oral TID  . levothyroxine  75 mcg Oral QAC breakfast  . potassium chloride  40 mEq Oral Once   Continuous Infusions: . sodium chloride 75 mL/hr at 08/02/13 2213    Principal Problem:   Weakness Active Problems:   Chronic kidney disease, stage II (mild)   Abnormality of gait   Unspecified essential hypertension   Dehydration   Hyponatremia   Renal insufficiency, mild   Shingles   Diet: regular Fluids: NS 75 cc/h DVT Prophylaxis: SCD  Time spent: 62  Pamella Pert, MD [facility] Pager [phone]. If 7 PM - 7 AM, please contact night-coverage at [email or web address], password [facility] 08/03/2013, 8:53 AM  LOS: 1 day

## 2013-08-04 LAB — BASIC METABOLIC PANEL
BUN: 30 mg/dL — ABNORMAL HIGH (ref 6–23)
Calcium: 8.9 mg/dL (ref 8.4–10.5)
Chloride: 97 mEq/L (ref 96–112)
Creatinine, Ser: 0.93 mg/dL (ref 0.50–1.10)
GFR calc Af Amer: 56 mL/min — ABNORMAL LOW (ref >90)
GFR calc non Af Amer: 48 mL/min — ABNORMAL LOW (ref >90)
Potassium: 4 mEq/L (ref 3.5–5.1)
Sodium: 129 mEq/L — ABNORMAL LOW (ref 135–145)

## 2013-08-04 LAB — CK: Total CK: 62 U/L (ref 7–177)

## 2013-08-04 LAB — CBC
HCT: 32.1 % — ABNORMAL LOW (ref 36.0–46.0)
Hemoglobin: 10.9 g/dL — ABNORMAL LOW (ref 12.0–15.0)
MCHC: 34 g/dL (ref 30.0–36.0)
Platelets: 256 10*3/uL (ref 150–400)
RDW: 16.8 % — ABNORMAL HIGH (ref 11.5–15.5)
WBC: 8.1 10*3/uL (ref 4.0–10.5)

## 2013-08-04 NOTE — Clinical Social Work Placement (Signed)
     Clinical Social Work Department CLINICAL SOCIAL WORK PLACEMENT NOTE 08/04/2013  Patient:  Kim Hodges, Kim Hodges  Account Number:  [id number] Admit date:  08/02/2013  Clinical Social Worker:  Becky Sax, LCSW  Date/time:  08/04/2013 12:00 M  Clinical Social Work is seeking post-discharge placement for this patient at the following level of care:   SKILLED NURSING   (*CSW will update this form in Epic as items are completed)   08/04/2013  Patient/family provided with [facility] Department of Clinical Social Works list of facilities offering this level of care within the geographic area requested by the patient (or if unable, by the patients family).  08/04/2013  Patient/family informed of their freedom to choose among providers that offer the needed level of care, that participate in Medicare, Medicaid or managed care program needed by the patient, have an available bed and are willing to accept the patient.  08/04/2013  Patient/family informed of [facility] ownership interest in [facility], as well as of the fact that they are under no obligation to receive care at this facility.  PASARR submitted to EDS on  PASARR number received from EDS on   FL2 transmitted to all facilities in geographic area requested by pt/family on  08/04/2013 FL2 transmitted to all facilities within larger geographic area on   Patient informed that his/her managed care company has contracts with or will negotiate with  certain facilities, including the following:     Patient/family informed of bed offers received:  08/04/2013 Patient chooses bed at [facility] Physician recommends and patient chooses bed at    Patient to be transferred to [facility] [place] on  08/04/2013 Patient to be transferred to facility by son  The following physician request were entered in Epic:   Additional Comments:

## 2013-08-04 NOTE — Progress Notes (Signed)
08/03/13 1229  PT Time Calculation  PT Start Time 1152  PT Stop Time 1210  PT Time Calculation (min) 18 min  PT G-Codes **NOT FOR INPATIENT CLASS**  Functional Assessment Tool Used (clinical judgement)  Functional Limitation Mobility: Walking and moving around  Mobility: Walking and Moving Around Current Status (N5621) CI  Mobility: Walking and Moving Around Goal Status (H0865) CH  PT General Charges  $$ ACUTE PT VISIT 1 Procedure  PT Evaluation  $Initial PT Evaluation Tier I 1 Procedure  PT Treatments  $Gait Training 8-22 mins   Rebeca Alert, MPT  [phone]

## 2013-08-04 NOTE — Clinical Social Work Psychosocial (Signed)
     Clinical Social Work Department BRIEF PSYCHOSOCIAL ASSESSMENT 08/04/2013  Patient:  Kim Hodges, Kim Hodges     Account Number:  [id number]     Admit date:  08/02/2013  Clinical Social Worker:  Hattie Perch  Date/Time:  08/04/2013 12:00 M  Referred by:  Physician  Date Referred:  08/04/2013 Referred for  SNF Placement   Other Referral:   Interview type:  Family Other interview type:    PSYCHOSOCIAL DATA Living Status:  FACILITY Admitted from facility:  [facility] Level of care:  Independent Living Primary support name:  Kaelee Pfeffer Primary support relationship to patient:  CHILD, ADULT Degree of support available:   good    CURRENT CONCERNS Current Concerns  Post-Acute Placement   Other Concerns:    SOCIAL WORK ASSESSMENT / PLAN CSW met with patient and patient's son at bedside. patient is alert and oriented X3. patient is from [facility] independent living. patient in need of snf placement there. patient is very happy at [facility] and only wants to go there. patient is [place] and needs auth. patient cleared for discharge today.   Assessment/plan status:   Other assessment/ plan:   Information/referral to community resources:    PATIENTS/FAMILYS RESPONSE TO PLAN OF CARE: family happy with the plan of going to [facility] snf. auth submitted for [place]. patient cleared for discharge.

## 2013-08-04 NOTE — Discharge Summary (Signed)
Physician Discharge Summary  Kim Hodges ZOX:[id number] DOB: 11/08/1908 DOA: 08/02/2013  PCP: [name], MD  Admit date: 08/02/2013 Discharge date: 08/04/2013  Time spent: 35 minutes  Recommendations for Outpatient Follow-up:  1. Follow up with PCP in 5 days   Recommendations for primary care physician for things to follow:  Recheck BMP; Check BP; consider restarting HCTZ  Discharge Diagnoses:  Principal Problem:   Weakness Active Problems:   Chronic kidney disease, stage II (mild)   Abnormality of gait   Unspecified essential hypertension   Dehydration   Hyponatremia   Renal insufficiency, mild   Shingles   Hypokalemia  Discharge Condition: stable  Diet recommendation: heart healthy  Filed Weights   08/02/13 2050  Weight: 54.386 kg (119 lb 14.4 oz)    History of present illness:  79 yo pleasant female lives in independent living center walks with a walker, h/o htn, glaucoma, mild ckd was diagnosed with shingles outbreak about 2.5 weeks ago. Given prednisone and antiviral which she completed both courses. Still has rash that is scabbed over to her left thigh mainly inner thigh and over the last several days she cannot walk on this leg, it gets weak and gives out on her , she says the thigh where the rash is has a lot of burning, tingling and numbness. Only above the knee where the rash is. She was prescribed neurontin which she has been taking and says helps a lot with the pain. Today her son found her on the floor several times, after a fall and then she didn't have the strength to get back up.  Hospital Course:  Patient was admitted under Med surg floor with weakness to the left thigh area. This was thought to be likely due to her recent shingles outbreak in that area as no focal deficits could be appreciated and her weakness was mainly due to ongoing discomfort in the thigh area; she describes the weakness as on her inner thigh where the rash was as "burning,  tingling and numbness". Her rash appears resolving, crusted and she has finished therapy prior to admission. Her symptoms are improving with Gabapentin. Physical therapy evaluated patient and recommended SNF on discharge prior to returning to ILF. She received supportive care and her weakness has improved with mild hydration. Patient was otherwise asymptomatic without any respiratory or urinary complaints.   Chronic kidney disease, stage II (mild) - stable  Unspecified essential hypertension  Dehydration  Hyponatremia - mildly hyponatremic on admission, likely due to HCTZ. Will recommend to hold HCTZ for the next few days and short term follow up with her PCP in 5-6 days for BMP recheck.   Shingles - s/p treatment prior to admission.  Hypokalemia - repleted and stable. Follow up with PCP for BMp recheck later this week.  Hypothyroidism - TSH checked, normal. Continue current Synthroid dosing.   Procedures:  none   Consultations:  none  Discharge Exam: Filed Vitals:   08/03/13 0538 08/03/13 1425 08/03/13 1900 08/04/13 0521  BP: 135/61 129/50 167/62 133/72  Pulse: 70 74 77 68  Temp: 97.9 F (36.6 C) 97.8 F (36.6 C) 98 F (36.7 C) 97.8 F (36.6 C)  TempSrc: Oral Oral Oral Oral  Resp: 18 18 18 18   Height:      Weight:      SpO2: 98% 100% 97% 98%   General: NAD Cardiovascular: RRR Respiratory: CTA biL  Discharge Instructions       Future Appointments Provider Department Dept Phone  11/26/2013 2:00 PM [name], MD [facility] [phone]       Medication List    STOP taking these medications       hydrochlorothiazide 12.5 MG capsule  Commonly known as:  MICROZIDE     predniSONE 20 MG tablet  Commonly known as:  DELTASONE     valACYclovir 1000 MG tablet  Commonly known as:  VALTREX      TAKE these medications       CALCIUM 600 + D PO  Take 1 tablet by mouth daily.     dorzolamide-timolol 22.3-6.8 MG/ML ophthalmic solution  Commonly known as:   COSOPT  Place 1 drop into both eyes 2 (two) times daily.     gabapentin 100 MG capsule  Commonly known as:  NEURONTIN  Take 100 mg by mouth 3 (three) times daily.     ICAPS LUTEIN-ZEAXANTHIN PO  Take 1 tablet by mouth daily.     levothyroxine 75 MCG tablet  Commonly known as:  SYNTHROID, LEVOTHROID  Take 75 mcg by mouth daily before breakfast.     TYLENOL ARTHRITIS PAIN 650 MG CR tablet  Generic drug:  acetaminophen  Take 650 mg by mouth. Take one tablet every 6 hours as needed for pain       Follow-up Information   Follow up with GREEN, Lenon Curt, MD. Schedule an appointment as soon as possible for a visit in 1 week.   Specialty:  Internal Medicine   Contact information:   [address] [place] [address] [phone]       The results of significant diagnostics from this hospitalization (including imaging, microbiology, ancillary and laboratory) are listed below for reference.    Significant Diagnostic Studies: Dg Chest 2 View  08/02/2013   CLINICAL DATA:  History of fall complaining of low back pain and left hip pain.  EXAM: CHEST  2 VIEW  COMPARISON:  CHEST x-ray 07/26/2005.  FINDINGS: Lung volumes are low. Mild diffuse peribronchial cuffing. Diffuse interstitial prominence. No evidence of pulmonary edema. No pleural effusions. No acute consolidative airspace disease. 2.2 x 1.3 cm nodular opacity in the left mid to upper lung projecting inferior to the anterior aspect of the left 2nd rib, new compared to the prior study. Heart size is borderline enlarged. Severe mitral annular calcifications. Upper mediastinal contours are within normal limits. Atherosclerosis in the thoracic aorta. Status post cholecystectomy.  IMPRESSION: 1. Mild diffuse peribronchial cuffing and interstitial prominence. Although some of these findings are chronic, the extent of peribronchial cuffing is greater than previously seen, which may suggest an acute bronchitis. 2. New nodular opacity in the  left mid to upper lung. Correlation with nonemergent chest CT is recommended to exclude underlying neoplasm. 3. Atherosclerosis. 4. Severe mitral annular calcifications.   Electronically Signed   By: Trudie Reed M.D.   On: 08/02/2013 16:36   Dg Lumbar Spine Complete  08/02/2013   CLINICAL DATA:  Fall. Low back pain.  EXAM: LUMBAR SPINE - COMPLETE 4+ VIEW  COMPARISON:  CT abdomen and pelvis 03/04/2006 and chest in two views abdomen 12/30/2006.  FINDINGS: There is marked convex left scoliosis and severe multilevel degenerative disease. A remote inferior endplate compression fracture of L1 is identified. No acute fracture is seen. Paraspinous structures are unremarkable.  IMPRESSION: No acute finding.  Remote L1 inferior endplate compression fracture.  Convex right scoliosis and severe multilevel degenerative change.   Electronically Signed   By: Drusilla Kanner M.D.   On: 08/02/2013  16:27   Dg Hip Complete Left  08/02/2013   CLINICAL DATA:  Left hip pain after fall.  EXAM: LEFT HIP - COMPLETE 2+ VIEW  COMPARISON:  None.  FINDINGS: There is no evidence of hip fracture or dislocation. There is no evidence of arthropathy or other focal bone abnormality.  IMPRESSION: Normal left hip.   Electronically Signed   By: Roque Lias M.D.   On: 08/02/2013 16:26   Ct Head Wo Contrast  08/02/2013   CLINICAL DATA:  Left leg weakness.  EXAM: CT HEAD WITHOUT CONTRAST  TECHNIQUE: Contiguous axial images were obtained from the base of the skull through the vertex without intravenous contrast.  COMPARISON:  Brain MRI 08/05/2005.  FINDINGS: There is some cortical atrophy and chronic microvascular ischemic change. No evidence of acute intracranial abnormality including infarction, hemorrhage, mass lesion, mass effect, midline shift or abnormal extra-axial fluid collection is identified. No hydrocephalus or pneumocephalus.  IMPRESSION: No acute finding. Mild atrophy and chronic microvascular ischemic change.    Electronically Signed   By: Drusilla Kanner M.D.   On: 08/02/2013 16:09    Microbiology: Recent Results (from the past 240 hour(s))  MRSA PCR SCREENING     Status: Abnormal   Collection Time    08/03/13  3:26 AM      Result Value Range Status   MRSA by PCR INVALID RESULTS, SPECIMEN SENT FOR CULTURE (*) NEGATIVE Final   Comment: CALLED TO J.ELMORE RN AT 0825 ON 10.13.14 BY SHUEA                The GeneXpert MRSA Assay (FDA     approved for NASAL specimens     only), is one component of a     comprehensive MRSA colonization     surveillance program. It is not     intended to diagnose MRSA     infection nor to guide or     monitor treatment for     MRSA infections.  MRSA PCR SCREENING     Status: None   Collection Time    08/03/13 11:02 AM      Result Value Range Status   MRSA by PCR NEGATIVE  NEGATIVE Final   Comment:            The GeneXpert MRSA Assay (FDA     approved for NASAL specimens     only), is one component of a     comprehensive MRSA colonization     surveillance program. It is not     intended to diagnose MRSA     infection nor to guide or     monitor treatment for     MRSA infections.     DELTA CHECK NOTED     Labs: Basic Metabolic Panel:  Recent Labs Lab 08/02/13 1630 08/02/13 1644 08/03/13 0532 08/04/13 0520  NA 129* 129* 129* 129*  K 3.4* 4.3 3.2* 4.0  CL 91* 94* 96 97  CO2 26  --  25 23  GLUCOSE 102* 103* 112* 118*  BUN 34* 32* 29* 30*  CREATININE 0.97 1.20* 0.93 0.93  CALCIUM 9.3  --  8.5 8.9   Liver Function Tests:  Recent Labs Lab 08/02/13 1630  AST 27  ALT 17  ALKPHOS 54  BILITOT 0.5  PROT 7.2  ALBUMIN 3.7   No results found for this basename: LIPASE, AMYLASE,  in the last 168 hours No results found for this basename: AMMONIA,  in the last 168 hours CBC:  Recent Labs Lab 08/02/13 1630 08/02/13 1644 08/03/13 0532 08/04/13 0520  WBC 10.0  --  7.2 8.1  NEUTROABS 6.7  --   --   --   HGB 12.4 13.3 11.2* 10.9*  HCT 36.3  39.0 32.7* 32.1*  MCV 87.5  --  87.9 89.2  PLT 306  --  243 256   Cardiac Enzymes:  Recent Labs Lab 08/02/13 1630 08/04/13 0520  CKTOTAL 185* 62  TROPONINI <0.30  --     Signed:  Pamella Pert  [facility] 08/04/2013, 7:51 AM

## 2013-08-04 NOTE — Progress Notes (Signed)
CSW attempted to submit passar but [facility] has already submitted. Per Orpha Bur, they will ensure its completion.  Goldye Tourangeau C. Antonius Hartlage MSW, LCSW [phone]

## 2013-08-04 NOTE — Progress Notes (Signed)
Report called to Blase Mess, LPN of [facility]. Patient stable and in good condition. Ambulates with assistance. VSS at Temp. 98, B/P 113/65, P-67, RR-16. Patient escorted to son's awaiting vehicle to be transported to [facility]. D/C packet and instructions given to son.

## 2013-08-05 ENCOUNTER — Non-Acute Institutional Stay (SKILLED_NURSING_FACILITY): Payer: Medicare PPO | Admitting: Nurse Practitioner

## 2013-08-05 ENCOUNTER — Encounter: Payer: Self-pay | Admitting: Nurse Practitioner

## 2013-08-05 DIAGNOSIS — R911 Solitary pulmonary nodule: Secondary | ICD-10-CM

## 2013-08-05 DIAGNOSIS — E876 Hypokalemia: Secondary | ICD-10-CM

## 2013-08-05 DIAGNOSIS — IMO0002 Reserved for concepts with insufficient information to code with codable children: Secondary | ICD-10-CM

## 2013-08-05 DIAGNOSIS — S32010A Wedge compression fracture of first lumbar vertebra, initial encounter for closed fracture: Secondary | ICD-10-CM | POA: Insufficient documentation

## 2013-08-05 DIAGNOSIS — E871 Hypo-osmolality and hyponatremia: Secondary | ICD-10-CM

## 2013-08-05 DIAGNOSIS — R5381 Other malaise: Secondary | ICD-10-CM

## 2013-08-05 DIAGNOSIS — B029 Zoster without complications: Secondary | ICD-10-CM

## 2013-08-05 DIAGNOSIS — R269 Unspecified abnormalities of gait and mobility: Secondary | ICD-10-CM

## 2013-08-05 DIAGNOSIS — S32010S Wedge compression fracture of first lumbar vertebra, sequela: Secondary | ICD-10-CM

## 2013-08-05 DIAGNOSIS — I10 Essential (primary) hypertension: Secondary | ICD-10-CM

## 2013-08-05 DIAGNOSIS — N182 Chronic kidney disease, stage 2 (mild): Secondary | ICD-10-CM

## 2013-08-05 DIAGNOSIS — I700 Atherosclerosis of aorta: Secondary | ICD-10-CM

## 2013-08-05 DIAGNOSIS — R531 Weakness: Secondary | ICD-10-CM

## 2013-08-05 LAB — MRSA CULTURE

## 2013-08-05 NOTE — Assessment & Plan Note (Signed)
Identified on X-ray Lumbar 08/02/13--remote inferior endplate compression fx--observe  

## 2013-08-05 NOTE — Assessment & Plan Note (Signed)
Takes Levothyroxine 75mcg daily, update TSH 

## 2013-08-05 NOTE — Assessment & Plan Note (Signed)
Hypokalemia - repleted and stable. Update CMP

## 2013-08-05 NOTE — Assessment & Plan Note (Signed)
Update BMP.  

## 2013-08-05 NOTE — Assessment & Plan Note (Signed)
Left mid lung per CXR 08/02/13. CT evaluation recommended. 79 yo and she is asymptomatic--may delay CT chest and observe the patient.

## 2013-08-05 NOTE — Assessment & Plan Note (Addendum)
Shingles - shingles outbreak about 3 weeks ago-s/p treatment prior to hospital admission. Takes Gabapentin 100mg  tid.

## 2013-08-05 NOTE — Assessment & Plan Note (Signed)
Here for Rehab.

## 2013-08-05 NOTE — Assessment & Plan Note (Signed)
Per CXR 08/02/13 atherosclerosis of thoracic aorta and severe mitral annular calcification. The patient should benefit low dose of ASA and Statin.

## 2013-08-05 NOTE — Assessment & Plan Note (Signed)
Controlled w/o antihypertensive agent.   

## 2013-08-05 NOTE — Assessment & Plan Note (Signed)
mildly hyponatremic on hospital admission, likely due to [facility] since hospital discharge. Update CMP

## 2013-08-05 NOTE — Progress Notes (Signed)
Patient ID: Kim Hodges, female   DOB: 06/23/1909, 79 y.o.   MRN: [id number] Code Status: DNR  No Known Allergies  Chief Complaint  Patient presents with  . Medical Managment of Chronic Issues  . Hospitalization Follow-up    HPI: Patient is a 79 y.o. female seen in the SNF at [facility] today for evaluation of  S/p hospitalization 08/02/13-08/04/13  and other chronic medical conditions. 79 yo pleasant female lives in independent living center @ [facility] walks with a walker, h/o htn, glaucoma, mild ckd. She was diagnosed with shingles outbreak about 3 weeks ago. Given prednisone and antiviral which she completed both courses. She was admitted to hospital due to her left leg where shingle outbreak was(left inner thigh) gets weak and gives out on her and her son found her on the floor several times and she didn't have the strength to get back up. She was treated for hyponatremia, dehydration, weakness, hypokalemia in hospital -stabilized to discharge to SNF . She is here for [place] and [place] to return [place] Problem List Items Addressed This Visit   Abnormality of gait     Here for Rehab.     Atherosclerosis of aorta     Per CXR 08/02/13 atherosclerosis of thoracic aorta and severe mitral annular calcification. The patient should benefit low dose of ASA and Statin.     Relevant Medications      aspirin 81 MG tablet   Chronic kidney disease, stage II (mild)     Update BMP    Compression fracture of L1 lumbar vertebra     Identified on X-ray Lumbar 08/02/13--remote inferior endplate compression fx--observe    Hypokalemia - Primary     Hypokalemia - repleted and stable. Update CMP    Hyponatremia     mildly hyponatremic on hospital admission, likely due to [facility] since hospital discharge. Update CMP     Nodule of left lung     Left mid lung per CXR 08/02/13. CT evaluation recommended. 79 yo and she is asymptomatic--may delay CT chest and observe the patient.     Shingles      Shingles - shingles outbreak about 3 weeks ago-s/p treatment prior to hospital admission. Takes Gabapentin 100mg  tid.         Unspecified essential hypertension     Controlled w/o antihypertensive agent    Relevant Medications      aspirin 81 MG tablet   Weakness     Takes Levothyroxine daily, update TSH       Review of Systems:  Review of Systems  Constitutional: Positive for malaise/fatigue. Negative for fever, chills, weight loss and diaphoresis.  HENT: Positive for hearing loss. Negative for congestion, ear discharge, ear pain, nosebleeds, sore throat and tinnitus.        Mild HOH  Eyes: Negative for blurred vision, double vision, pain, discharge and redness.       Glaucoma R+L  Respiratory: Negative for cough, hemoptysis, sputum production, shortness of breath, wheezing and stridor.   Cardiovascular: Positive for leg swelling. Negative for chest pain, palpitations, orthopnea, claudication and PND.       Edema left ankle.   Gastrointestinal: Negative for heartburn, nausea, vomiting, abdominal pain, diarrhea, constipation, blood in stool and melena.  Genitourinary: Positive for frequency. Negative for dysuria, urgency, hematuria and flank pain.  Musculoskeletal: Positive for joint pain. Negative for back pain, falls, myalgias and neck pain.       Left knee pain since the onset  of left inner thigh shingles. Ambulates with walker  Skin: Positive for rash. Negative for itching.       Left inner thigh-healing shingles-most crust/scab gone.   Neurological: Positive for sensory change. Negative for dizziness, tingling, tremors, speech change, focal weakness, seizures, loss of consciousness, weakness and headaches.       Dull pain in her left inner thigh.   Endo/Heme/Allergies: Negative for environmental allergies and polydipsia. Does not bruise/bleed easily.  Psychiatric/Behavioral: Positive for memory loss. Negative for depression, suicidal ideas, hallucinations and  substance abuse. The patient is not nervous/anxious and does not have insomnia.      Past Medical History  Diagnosis Date  . Chronic kidney disease, stage II (mild) 12/25/2012  . Unspecified arthropathy, lower leg 09/11/2011  . Swelling of limb 09/10/2012  . Other and unspecified hyperlipidemia 07/26/2011  . Actinic keratosis 07/26/2011  . Abnormality of gait 07/26/2011  . Other abnormal blood chemistry 07/26/2011  . Tear film insufficiency, unspecified 12/29/2009  . Other malaise and fatigue 12/29/2009  . Unspecified hearing loss 06/30/2009  . Unspecified hypothyroidism 12/15/2008  . Unspecified essential hypertension 12/15/2008  . Unspecified glaucoma(365.9) 12/15/2008    both eyes   Past Surgical History  Procedure Laterality Date  . Small intestine surgery  1957  . Cardiac catheterization  2004  . Gallbladder surgery  2006  . Cholecystectomy     Social History:   reports that she has never smoked. She has never used smokeless tobacco. She reports that she does not drink alcohol or use illicit drugs.  Family History  Problem Relation Age of Onset  . Cancer Sister   . Cancer Brother     pancreatic  . Cancer Son     lung  . Cancer Brother     pancreatic    Medications: Patient's Medications  New Prescriptions   No medications on file  Previous Medications   ACETAMINOPHEN (TYLENOL ARTHRITIS PAIN) 650 MG CR TABLET    Take 650 mg by mouth. Take one tablet every 6 hours as needed for pain   ASPIRIN 81 MG TABLET    Take 81 mg by mouth daily.   CALCIUM CARBONATE-VITAMIN D (CALCIUM 600 + D PO)    Take 1 tablet by mouth daily.   DORZOLAMIDE-TIMOLOL (COSOPT) 22.3-6.8 MG/ML OPHTHALMIC SOLUTION    Place 1 drop into both eyes 2 (two) times daily.    GABAPENTIN (NEURONTIN) 100 MG CAPSULE    Take 100 mg by mouth 3 (three) times daily.   LEVOTHYROXINE (SYNTHROID, LEVOTHROID) 75 MCG TABLET    Take 75 mcg by mouth daily before breakfast.   SPECIALTY VITAMINS PRODUCTS (ICAPS LUTEIN-ZEAXANTHIN  PO)    Take 1 tablet by mouth daily.  Modified Medications   No medications on file  Discontinued Medications   No medications on file     Physical Exam: Physical Exam  Constitutional: She is oriented to person, place, and time. She appears well-developed and well-nourished. No distress.  HENT:  Head: Normocephalic and atraumatic.  Right Ear: External ear normal.  Left Ear: External ear normal.  Nose: Nose normal.  Mouth/Throat: Oropharynx is clear and moist. No oropharyngeal exudate.  Eyes: Conjunctivae and EOM are normal. Pupils are equal, round, and reactive to light. Right eye exhibits no discharge. Left eye exhibits no discharge. No scleral icterus.  Neck: Normal range of motion. Neck supple. No JVD present. No tracheal deviation present. No thyromegaly present.  Cardiovascular: Normal rate and regular rhythm.   Murmur heard. 3/6  Pulmonary/Chest:  Effort normal and breath sounds normal. No stridor. No respiratory distress. She has no wheezes. She has no rales. She exhibits no tenderness.  Abdominal: Soft. Bowel sounds are normal. She exhibits no distension. There is no tenderness. There is no rebound and no guarding.  Musculoskeletal: Normal range of motion. She exhibits edema. She exhibits no tenderness.  Left ankle edema.   Lymphadenopathy:    She has no cervical adenopathy.  Neurological: She is alert and oriented to person, place, and time. She displays normal reflexes. No cranial nerve deficit. She exhibits normal muscle tone. Coordination normal.  Skin: Skin is warm and dry. Rash noted. She is not diaphoretic. There is erythema. No pallor.  Patchy redness left inner thigh from healing shingle.   Psychiatric: She has a normal mood and affect. Her behavior is normal. Judgment and thought content normal.    Filed Vitals:   08/05/13 1107  BP: 118/72  Pulse: 68  Temp: 97.6 F (36.4 C)  TempSrc: Tympanic  Resp: 16      Labs reviewed: Basic Metabolic  Panel:  Recent Labs  08/02/13 1630 08/02/13 1644 08/03/13 0532 08/03/13 1025 08/04/13 0520  NA 129* 129* 129*  --  129*  K 3.4* 4.3 3.2*  --  4.0  CL 91* 94* 96  --  97  CO2 26  --  25  --  23  GLUCOSE 102* 103* 112*  --  118*  BUN 34* 32* 29*  --  30*  CREATININE 0.97 1.20* 0.93  --  0.93  CALCIUM 9.3  --  8.5  --  8.9  TSH  --   --   --  2.243  --    Liver Function Tests:  Recent Labs  08/02/13 1630  AST 27  ALT 17  ALKPHOS 54  BILITOT 0.5  PROT 7.2  ALBUMIN 3.7   CBC:  Recent Labs  08/02/13 1630 08/02/13 1644 08/03/13 0532 08/04/13 0520  WBC 10.0  --  7.2 8.1  NEUTROABS 6.7  --   --   --   HGB 12.4 13.3 11.2* 10.9*  HCT 36.3 39.0 32.7* 32.1*  MCV 87.5  --  87.9 89.2  PLT 306  --  243 256   Past Procedures:  08/02/2013 CHEST 2 VIEW COMPARISON: CHEST x-ray 07/26/2005. FINDINGS: Severe mitral annular calcifications. Atherosclerosis in the thoracic aorta. Status post cholecystectomy. IMPRESSION: 1. Mild diffuse peribronchial cuffing and interstitial prominence. Although some of these findings are chronic, the extent of peribronchial cuffing is greater than previously seen, which may suggest an acute bronchitis. 2. New nodular opacity in the left mid to upper lung. Correlation with nonemergent chest CT is recommended to exclude underlying neoplasm. 3. Atherosclerosis. 4. Severe mitral annular calcifications.   08/02/2013 LUMBAR SPINE - COMPLETE 4+ VIEW COMPARISON: CT abdomen and pelvis 03/04/2006 and chest in two views abdomen 12/30/2006. IMPRESSION: No acute finding. Remote L1 inferior endplate compression fracture. Convex right scoliosis and severe multilevel degenerative change.   08/02/2013 LEFT HIP - COMPLETE 2+ VIEW COMPARISON: None. IMPRESSION: Normal left hip.   08/02/2013 CLINICAL DATA: Left leg weakness. EXAM: CT HEAD WITHOUT CONTRAST TECHNIQUE: Contiguous axial images were obtained from the base of the skull through the vertex without intravenous  contrast. COMPARISON: Brain MRI 08/05/2005. IMPRESSION: No acute finding. Mild atrophy and chronic microvascular ischemic change.    Assessment/Plan Hypokalemia Hypokalemia - repleted and stable. Update CMP  Shingles Shingles - shingles outbreak about 3 weeks ago-s/p treatment prior to hospital admission. Takes Gabapentin 100mg   tid.       Hyponatremia mildly hyponatremic on hospital admission, likely due to [facility] since hospital discharge. Update CMP   Chronic kidney disease, stage II (mild) Update BMP  Unspecified essential hypertension Controlled w/o antihypertensive agent  Weakness Takes Levothyroxine daily, update TSH  Abnormality of gait Here for Rehab.   Atherosclerosis of aorta Per CXR 08/02/13 atherosclerosis of thoracic aorta and severe mitral annular calcification. The patient should benefit low dose of ASA and Statin.   Nodule of left lung Left mid lung per CXR 08/02/13. CT evaluation recommended. 79 yo and she is asymptomatic--may delay CT chest and observe the patient.   Compression fracture of L1 lumbar vertebra Identified on X-ray Lumbar 08/02/13--remote inferior endplate compression fx--observe    Family/ Staff Communication: observe the patient  Goals of Care: [place]  Labs/tests ordered: CMP, CBC, TSH

## 2013-08-06 ENCOUNTER — Encounter: Payer: Medicare PPO | Admitting: Internal Medicine

## 2013-08-06 ENCOUNTER — Encounter: Payer: Self-pay | Admitting: Internal Medicine

## 2013-08-06 ENCOUNTER — Non-Acute Institutional Stay (SKILLED_NURSING_FACILITY): Payer: Medicare PPO | Admitting: Internal Medicine

## 2013-08-06 DIAGNOSIS — R911 Solitary pulmonary nodule: Secondary | ICD-10-CM

## 2013-08-06 DIAGNOSIS — S32009D Unspecified fracture of unspecified lumbar vertebra, subsequent encounter for fracture with routine healing: Secondary | ICD-10-CM

## 2013-08-06 DIAGNOSIS — E860 Dehydration: Secondary | ICD-10-CM

## 2013-08-06 DIAGNOSIS — R918 Other nonspecific abnormal finding of lung field: Secondary | ICD-10-CM

## 2013-08-06 DIAGNOSIS — R269 Unspecified abnormalities of gait and mobility: Secondary | ICD-10-CM

## 2013-08-06 DIAGNOSIS — G319 Degenerative disease of nervous system, unspecified: Secondary | ICD-10-CM

## 2013-08-06 DIAGNOSIS — R413 Other amnesia: Secondary | ICD-10-CM

## 2013-08-06 DIAGNOSIS — I7090 Unspecified atherosclerosis: Secondary | ICD-10-CM

## 2013-08-06 DIAGNOSIS — I10 Essential (primary) hypertension: Secondary | ICD-10-CM

## 2013-08-06 DIAGNOSIS — R739 Hyperglycemia, unspecified: Secondary | ICD-10-CM

## 2013-08-06 DIAGNOSIS — B0229 Other postherpetic nervous system involvement: Secondary | ICD-10-CM

## 2013-08-06 DIAGNOSIS — I679 Cerebrovascular disease, unspecified: Secondary | ICD-10-CM

## 2013-08-06 DIAGNOSIS — E785 Hyperlipidemia, unspecified: Secondary | ICD-10-CM

## 2013-08-06 DIAGNOSIS — S32010D Wedge compression fracture of first lumbar vertebra, subsequent encounter for fracture with routine healing: Secondary | ICD-10-CM

## 2013-08-06 DIAGNOSIS — R5381 Other malaise: Secondary | ICD-10-CM

## 2013-08-06 DIAGNOSIS — E039 Hypothyroidism, unspecified: Secondary | ICD-10-CM

## 2013-08-06 DIAGNOSIS — IMO0002 Reserved for concepts with insufficient information to code with codable children: Secondary | ICD-10-CM

## 2013-08-06 DIAGNOSIS — E876 Hypokalemia: Secondary | ICD-10-CM

## 2013-08-06 DIAGNOSIS — R7309 Other abnormal glucose: Secondary | ICD-10-CM

## 2013-08-06 DIAGNOSIS — R531 Weakness: Secondary | ICD-10-CM

## 2013-08-06 DIAGNOSIS — R9389 Abnormal findings on diagnostic imaging of other specified body structures: Secondary | ICD-10-CM

## 2013-08-06 DIAGNOSIS — E871 Hypo-osmolality and hyponatremia: Secondary | ICD-10-CM

## 2013-08-06 DIAGNOSIS — N182 Chronic kidney disease, stage 2 (mild): Secondary | ICD-10-CM

## 2013-08-06 DIAGNOSIS — R011 Cardiac murmur, unspecified: Secondary | ICD-10-CM

## 2013-08-06 DIAGNOSIS — B029 Zoster without complications: Secondary | ICD-10-CM

## 2013-08-06 NOTE — Progress Notes (Signed)
Subjective:    Patient ID: Kim Hodges, female    DOB: January 01, 1909, 79 y.o.   MRN: [id number]  Chief Complaint  Patient presents with  . New Evaluation    new admit to SNF 08/04/13    HAS LIVING WILL, HCPOA  HPI  Hospitalized 08/02/13 - 08/04/13. Reportedly, she was very weak and may have fallen and was unable to get up. She had started a painful rash late Sept 2014 that is herpes zoster. She was diagnosed with dehydration, but there is little to support this. Renal function tests were no worse than normal. She was voiding well, and she denied poor intake prior to admission. She was given IV fluids, appeared to improve and was returned to [facility] for admission to SNF area.  She is alert and feels she is improving today  Unspecified essential hypertension:controlled  Unspecified hypothyroidism: controlled on supplements  Compression fracture of L1 lumbar vertebra, with routine healing, subsequent encounter: incidental finding on x-rays. Painless.  Chronic kidney disease, stage II (mild): chronic; unchanged  Other and unspecified hyperlipidemia: chronic  Memory loss: mild  Dehydration: given IV fluids at hospital. HCTZ discontinued.  Abnormality of gait: using walker  Hyponatremia: as low as 129  Shingles: recovering  Hypokalemia: resolved  Nodule of left lung: may relate to recent events. Will observe. Consider CT of chest in a few weeks.  Hyperglycemia: chronic  Weak: multifactorial ( age, recent painful shingles, hyponatremia, ? Dehydration)  Herpes zoster: resolving  Post herpetic neuralgia: persists. Currently on gabapentin. Better than a week ago  Cerebral atrophy: incidental finding on CT brain  Cerebrovascular disease: incidental finding on CT brain  Atherosclerosis: noted on x-ray  Abnormal chest x-ray: nodule in the left mid to upper lung  Murmur, cardiac: likely AS  Fracture lumbar vertebra-closed, with routine healing, subsequent encounter:  incidental finding on x-ray.Painless.  Memory changes: continue to observe     No Known Allergies Immunization History  Administered Date(s) Administered  . Influenza Whole 07/22/2012  . Pneumococcal Polysaccharide 10/22/2006  . Td 10/22/2004    Current Outpatient Prescriptions on File Prior to Visit  Medication Sig Dispense Refill  . acetaminophen (TYLENOL ARTHRITIS PAIN) 650 MG CR tablet Take 650 mg by mouth. Take one tablet every 6 hours as needed for pain      . aspirin 81 MG tablet Take 81 mg by mouth daily.      . Calcium Carbonate-Vitamin D (CALCIUM 600 + D PO) Take 1 tablet by mouth daily.      . dorzolamide-timolol (COSOPT) 22.3-6.8 MG/ML ophthalmic solution Place 1 drop into both eyes 2 (two) times daily.       Marland Kitchen gabapentin (NEURONTIN) 100 MG capsule Take 100 mg by mouth 3 (three) times daily.      Marland Kitchen levothyroxine (SYNTHROID, LEVOTHROID) 75 MCG tablet Take 75 mcg by mouth daily before breakfast.      . Specialty Vitamins Products (ICAPS LUTEIN-ZEAXANTHIN PO) Take 1 tablet by mouth daily.       No current facility-administered medications on file prior to visit.   Past Medical History  Diagnosis Date  . Chronic kidney disease, stage II (mild) 12/25/2012  . Unspecified arthropathy, lower leg 09/11/2011  . Swelling of limb 09/10/2012  . Other and unspecified hyperlipidemia 07/26/2011  . Actinic keratosis 07/26/2011  . Abnormality of gait 07/26/2011  . Hyperglycemia 07/26/2011  . Tear film insufficiency, unspecified 12/29/2009  . Other malaise and fatigue 12/29/2009  . Unspecified hearing loss 06/30/2009  . Unspecified hypothyroidism  12/15/2008  . Unspecified essential hypertension 12/15/2008  . Unspecified glaucoma(365.9) 12/15/2008    both eyes  . Weak 08/02/13  . Abnormality of gait 08/02/13  . Hyponatremia 08/02/13  . Herpes zoster 08/02/13  . Post herpetic neuralgia 08/02/13  . Cerebral atrophy 08/02/13  . Cerebrovascular disease 08/02/13  . Atherosclerosis 08/02/13   . Abnormal chest x-ray 08/02/13    08/02/13 nodule in left mid to upper lung  . Murmur, cardiac 08/02/13    aortic ejection  . Fracture lumbar vertebra-closed 08/02/13    L1  . Memory changes 07/03/13   Past Surgical History  Procedure Laterality Date  . Small intestine surgery  1957  . Cardiac catheterization  2004  . Gallbladder surgery  2006  . Cholecystectomy     Family Status  Relation Status Death Age  . Mother Deceased 49    natral causes  . Father Deceased 48    natural causes  . Sister Deceased 55  . Brother Deceased 69  . Son Deceased 47  . Brother Deceased 74  . Son Alive    Family History  Problem Relation Age of Onset  . Cancer Sister   . Cancer Brother     pancreatic  . Cancer Son     lung  . Cancer Brother     pancreatic   History   Social History  . Marital Status: Widowed    Spouse Name: N/A    Number of Children: N/A  . Years of Education: N/A   Occupational History  . retired Runner, broadcasting/film/video    Social History Main Topics  . Smoking status: Never Smoker   . Smokeless tobacco: Never Used  . Alcohol Use: No  . Drug Use: No  . Sexual Activity: No   Other Topics Concern  . Not on file   Social History Narrative   Patient lives at [facility]     Review of Systems  Constitutional: Negative for fever, activity change, appetite change, fatigue and unexpected weight change.  HENT: Positive for hearing loss. Negative for ear pain.   Eyes: Positive for visual disturbance.  Respiratory: Negative for cough, choking, shortness of breath and wheezing.   Cardiovascular: Positive for leg swelling. Negative for chest pain and palpitations.  Gastrointestinal: Negative.   Endocrine: Negative.   Musculoskeletal:       Generalized weakness. No falls. Uses walker.  Allergic/Immunologic: Negative.   Neurological: Positive for weakness.  Hematological: Negative.   Psychiatric/Behavioral: Negative.        Objective:BP 118/72  Pulse 68  Ht  4\' 11"  (1.499 m)  Wt 119 lb (53.978 kg)  BMI 24.02 kg/m2  SpO2 97%    Physical Exam  Constitutional: She is oriented to person, place, and time. She appears well-developed and well-nourished. No distress.  HENT:  Head: Normocephalic.  Nose: Nose normal.  Loss of hearing bilaterally.  Eyes: Conjunctivae and EOM are normal. Pupils are equal, round, and reactive to light.  Corrective lenses  Neck: Neck supple. No JVD present. No tracheal deviation present. No thyromegaly present.  Cardiovascular: Normal rate, regular rhythm and intact distal pulses.  Exam reveals no friction rub.   Murmur (2/6 aortic ejecton murmur) heard. Pulmonary/Chest: Effort normal and breath sounds normal. No respiratory distress. She has no wheezes. She has no rales.  Abdominal: She exhibits no distension and no mass. There is no tenderness.  Musculoskeletal: Normal range of motion. She exhibits edema. She exhibits no tenderness.  Unstable gait.  Lymphadenopathy:  She has no cervical adenopathy.  Neurological: She is alert and oriented to person, place, and time. She has normal reflexes. No cranial nerve deficit. Coordination normal.  07/09/13 MMSE: 21/30; failed clock drawing Hypersensitive area of scarring of the left medial thigh.  Skin: Skin is dry. Rash (post shingles in left medial thigh; resolving) noted. No erythema. No pallor.  Extensive scarring of the left thigh medially from shingles.  Psychiatric: She has a normal mood and affect. Her behavior is normal. Judgment and thought content normal.   LAB REVIEW 06/30/13 BMP: Glu 108,, BUN 31, creat 1.12  TSH 3.202   A!C 6.5  Admission on 08/02/2013, Discharged on 08/04/2013  Component Date Value Range Status  . Alcohol, Ethyl (B) 08/02/2013 <11  0 - 11 mg/dL Final   Comment:                                 LOWEST DETECTABLE LIMIT FOR                          SERUM ALCOHOL IS 11 mg/dL                          FOR MEDICAL PURPOSES ONLY  . Prothrombin  Time 08/02/2013 13.1  11.6 - 15.2 seconds Final  . INR 08/02/2013 1.01  0.00 - 1.49 Final  . aPTT 08/02/2013 27  24 - 37 seconds Final  . WBC 08/02/2013 10.0  4.0 - 10.5 K/uL Final  . RBC 08/02/2013 4.15  3.87 - 5.11 MIL/uL Final  . Hemoglobin 08/02/2013 12.4  12.0 - 15.0 g/dL Final  . HCT 16/07/9603 36.3  36.0 - 46.0 % Final  . MCV 08/02/2013 87.5  78.0 - 100.0 fL Final  . MCH 08/02/2013 29.9  26.0 - 34.0 pg Final  . MCHC 08/02/2013 34.2  30.0 - 36.0 g/dL Final  . RDW 54/06/8118 16.4* 11.5 - 15.5 % Final  . Platelets 08/02/2013 306  150 - 400 K/uL Final  . Neutrophils Relative % 08/02/2013 67  43 - 77 % Final  . Neutro Abs 08/02/2013 6.7  1.7 - 7.7 K/uL Final  . Lymphocytes Relative 08/02/2013 20  12 - 46 % Final  . Lymphs Abs 08/02/2013 2.0  0.7 - 4.0 K/uL Final  . Monocytes Relative 08/02/2013 12  3 - 12 % Final  . Monocytes Absolute 08/02/2013 1.2* 0.1 - 1.0 K/uL Final  . Eosinophils Relative 08/02/2013 1  0 - 5 % Final  . Eosinophils Absolute 08/02/2013 0.1  0.0 - 0.7 K/uL Final  . Basophils Relative 08/02/2013 0  0 - 1 % Final  . Basophils Absolute 08/02/2013 0.0  0.0 - 0.1 K/uL Final  . Sodium 08/02/2013 129* 135 - 145 mEq/L Final  . Potassium 08/02/2013 3.4* 3.5 - 5.1 mEq/L Final  . Chloride 08/02/2013 91* 96 - 112 mEq/L Final  . CO2 08/02/2013 26  19 - 32 mEq/L Final  . Glucose, Bld 08/02/2013 102* 70 - 99 mg/dL Final  . BUN 14/78/2956 34* 6 - 23 mg/dL Final  . Creatinine, Ser 08/02/2013 0.97  0.50 - 1.10 mg/dL Final  . Calcium 21/30/8657 9.3  8.4 - 10.5 mg/dL Final  . Total Protein 08/02/2013 7.2  6.0 - 8.3 g/dL Final  . Albumin 84/69/6295 3.7  3.5 - 5.2 g/dL Final  . AST 28/41/3244 27  0 -  37 U/L Final  . ALT 08/02/2013 17  0 - 35 U/L Final  . Alkaline Phosphatase 08/02/2013 54  39 - 117 U/L Final  . Total Bilirubin 08/02/2013 0.5  0.3 - 1.2 mg/dL Final  . GFR calc non Af Amer 08/02/2013 46* >90 mL/min Final  . GFR calc Af Amer 08/02/2013 53* >90 mL/min Final    Comment: (NOTE)                          The eGFR has been calculated using the CKD EPI equation.                          This calculation has not been validated in all clinical situations.                          eGFR's persistently <90 mL/min signify possible Chronic Kidney                          Disease.  . Troponin I 08/02/2013 <0.30  <0.30 ng/mL Final   Comment:                                 Due to the release kinetics of cTnI,                          a negative result within the first hours                          of the onset of symptoms does not rule out                          myocardial infarction with certainty.                          If myocardial infarction is still suspected,                          repeat the test at appropriate intervals.  . Color, Urine 08/02/2013 YELLOW  YELLOW Final  . APPearance 08/02/2013 CLEAR  CLEAR Final  . Specific Gravity, Urine 08/02/2013 1.018  1.005 - 1.030 Final  . pH 08/02/2013 5.5  5.0 - 8.0 Final  . Glucose, UA 08/02/2013 NEGATIVE  NEGATIVE mg/dL Final  . Hgb urine dipstick 08/02/2013 NEGATIVE  NEGATIVE Final  . Bilirubin Urine 08/02/2013 NEGATIVE  NEGATIVE Final  . Ketones, ur 08/02/2013 NEGATIVE  NEGATIVE mg/dL Final  . Protein, ur 52/84/1324 NEGATIVE  NEGATIVE mg/dL Final  . Urobilinogen, UA 08/02/2013 0.2  0.0 - 1.0 mg/dL Final  . Nitrite 40/07/2724 NEGATIVE  NEGATIVE Final  . Leukocytes, UA 08/02/2013 SMALL* NEGATIVE Final  . Sodium 08/02/2013 129* 135 - 145 mEq/L Final  . Potassium 08/02/2013 4.3  3.5 - 5.1 mEq/L Final  . Chloride 08/02/2013 94* 96 - 112 mEq/L Final  . BUN 08/02/2013 32* 6 - 23 mg/dL Final  . Creatinine, Ser 08/02/2013 1.20* 0.50 - 1.10 mg/dL Final  . Glucose, Bld 36/64/4034 103* 70 - 99 mg/dL Final  . Calcium, Ion 74/25/9563 1.09* 1.13 - 1.30 mmol/L Final  . TCO2 08/02/2013 25  0 - 100 mmol/L Final  . Hemoglobin 08/02/2013 13.3  12.0 - 15.0 g/dL Final  . HCT 16/07/9603 39.0  36.0 - 46.0 % Final  .  Troponin i, poc 08/02/2013 0.04  0.00 - 0.08 ng/mL Final  . Comment 3 08/02/2013          Final   Comment: Due to the release kinetics of cTnI,                          a negative result within the first hours                          of the onset of symptoms does not rule out                          myocardial infarction with certainty.                          If myocardial infarction is still suspected,                          repeat the test at appropriate intervals.  . Squamous Epithelial / LPF 08/02/2013 RARE  RARE Final  . WBC, UA 08/02/2013 0-2  <3 WBC/hpf Final  . Sodium 08/03/2013 129* 135 - 145 mEq/L Final  . Potassium 08/03/2013 3.2* 3.5 - 5.1 mEq/L Final   Comment: DELTA CHECK NOTED                          REPEATED TO VERIFY  . Chloride 08/03/2013 96  96 - 112 mEq/L Final  . CO2 08/03/2013 25  19 - 32 mEq/L Final  . Glucose, Bld 08/03/2013 112* 70 - 99 mg/dL Final  . BUN 54/06/8118 29* 6 - 23 mg/dL Final  . Creatinine, Ser 08/03/2013 0.93  0.50 - 1.10 mg/dL Final  . Calcium 14/78/2956 8.5  8.4 - 10.5 mg/dL Final  . GFR calc non Af Amer 08/03/2013 48* >90 mL/min Final  . GFR calc Af Amer 08/03/2013 56* >90 mL/min Final   Comment: (NOTE)                          The eGFR has been calculated using the CKD EPI equation.                          This calculation has not been validated in all clinical situations.                          eGFR's persistently <90 mL/min signify possible Chronic Kidney                          Disease.  . WBC 08/03/2013 7.2  4.0 - 10.5 K/uL Final  . RBC 08/03/2013 3.72* 3.87 - 5.11 MIL/uL Final  . Hemoglobin 08/03/2013 11.2* 12.0 - 15.0 g/dL Final  . HCT 21/30/8657 32.7* 36.0 - 46.0 % Final  . MCV 08/03/2013 87.9  78.0 - 100.0 fL Final  . MCH 08/03/2013 30.1  26.0 - 34.0 pg Final  . MCHC 08/03/2013 34.3  30.0 - 36.0 g/dL Final  . RDW 84/69/6295 16.4* 11.5 -  15.5 % Final  . Platelets 08/03/2013 243  150 - 400 K/uL Final  . Total CK  08/02/2013 185* 7 - 177 U/L Final  . MRSA by PCR 08/03/2013 INVALID RESULTS, SPECIMEN SENT FOR CULTURE* NEGATIVE Final   Comment: CALLED TO J.ELMORE RN AT 0825 ON 10.13.14 BY SHUEA                                                          The GeneXpert MRSA Assay (FDA                          approved for NASAL specimens                          only), is one component of a                          comprehensive MRSA colonization                          surveillance program. It is not                          intended to diagnose MRSA                          infection nor to guide or                          monitor treatment for                          MRSA infections.  Marland Kitchen TSH 08/03/2013 2.243  0.350 - 4.500 uIU/mL Final   Performed at [facility]  . Specimen Description 08/03/2013 NOSE   Final  . Special Requests 08/03/2013 NONE   Final  . Culture 08/03/2013    Final                   Value:NO STAPHYLOCOCCUS AUREUS ISOLATED                         Note: NOMRSA                         Performed at [facility]  . Report Status 08/03/2013 08/05/2013 FINAL   Final  . MRSA by PCR 08/03/2013 NEGATIVE  NEGATIVE Final   Comment:                                 The GeneXpert MRSA Assay (FDA                          approved for NASAL specimens                          only), is one component of a  comprehensive MRSA colonization                          surveillance program. It is not                          intended to diagnose MRSA                          infection nor to guide or                          monitor treatment for                          MRSA infections.                          DELTA CHECK NOTED  . WBC 08/04/2013 8.1  4.0 - 10.5 K/uL Final  . RBC 08/04/2013 3.60* 3.87 - 5.11 MIL/uL Final  . Hemoglobin 08/04/2013 10.9* 12.0 - 15.0 g/dL Final  . HCT 95/62/1308 32.1* 36.0 - 46.0 % Final  . MCV 08/04/2013 89.2  78.0 - 100.0 fL  Final  . MCH 08/04/2013 30.3  26.0 - 34.0 pg Final  . MCHC 08/04/2013 34.0  30.0 - 36.0 g/dL Final  . RDW 65/78/4696 16.8* 11.5 - 15.5 % Final  . Platelets 08/04/2013 256  150 - 400 K/uL Final  . Sodium 08/04/2013 129* 135 - 145 mEq/L Final  . Potassium 08/04/2013 4.0  3.5 - 5.1 mEq/L Final   Comment: DELTA CHECK NOTED                          REPEATED TO VERIFY                          NO VISIBLE HEMOLYSIS  . Chloride 08/04/2013 97  96 - 112 mEq/L Final  . CO2 08/04/2013 23  19 - 32 mEq/L Final  . Glucose, Bld 08/04/2013 118* 70 - 99 mg/dL Final  . BUN 29/52/8413 30* 6 - 23 mg/dL Final  . Creatinine, Ser 08/04/2013 0.93  0.50 - 1.10 mg/dL Final  . Calcium 24/40/1027 8.9  8.4 - 10.5 mg/dL Final  . GFR calc non Af Amer 08/04/2013 48* >90 mL/min Final  . GFR calc Af Amer 08/04/2013 56* >90 mL/min Final   Comment: (NOTE)                          The eGFR has been calculated using the CKD EPI equation.                          This calculation has not been validated in all clinical situations.                          eGFR's persistently <90 mL/min signify possible Chronic Kidney                          Disease.  . Total CK 08/04/2013 62  7 - 177 U/L Final        Assessment & Plan:  Weakness:  engaged with PT and OT  Unspecified essential hypertension: controlled  Unspecified hypothyroidism; controlled on supplements  Compression fracture of L1 lumbar vertebra, with routine healing, subsequent encounter: Painless. Observe  Chronic kidney disease, stage II (mild); unchanged  Other and unspecified hyperlipidemia:unchanged  Memory loss: observe  Dehydration: resolved  Abnormality of gait; continue use of walker  Hyponatremia:  resolved  Shingles: resolving  Hypokalemia: resolved  Nodule of left lung: consider CT chest in a few weeks  Hyperglycemia: observe  Herpes zoster: resolving  Post herpetic neuralgia: continue gabapentin  Cerebral atrophy:  observe  Cerebrovascular disease: observe  Atherosclerosis: observe  Abnormal chest x-ray:nodule of left lung  Murmur, cardiac: observe  Memory changes: observe

## 2013-09-22 LAB — BASIC METABOLIC PANEL
BUN: 16 mg/dL (ref 4–21)
Sodium: 138 mmol/L (ref 137–147)

## 2013-09-22 LAB — HEPATIC FUNCTION PANEL
ALT: 8 U/L (ref 7–35)
Alkaline Phosphatase: 55 U/L (ref 25–125)
Bilirubin, Total: 0.6 mg/dL

## 2013-09-22 LAB — CBC AND DIFFERENTIAL
HCT: 32 % — AB (ref 36–46)
Hemoglobin: 10.8 g/dL — AB (ref 12.0–16.0)
Platelets: 250 10*3/uL (ref 150–399)

## 2013-09-24 ENCOUNTER — Non-Acute Institutional Stay: Payer: Medicare PPO | Admitting: Nurse Practitioner

## 2013-09-24 ENCOUNTER — Encounter: Payer: Self-pay | Admitting: Nurse Practitioner

## 2013-09-24 DIAGNOSIS — I272 Pulmonary hypertension, unspecified: Secondary | ICD-10-CM

## 2013-09-24 DIAGNOSIS — J189 Pneumonia, unspecified organism: Secondary | ICD-10-CM

## 2013-09-24 DIAGNOSIS — I2789 Other specified pulmonary heart diseases: Secondary | ICD-10-CM

## 2013-09-24 DIAGNOSIS — R609 Edema, unspecified: Secondary | ICD-10-CM

## 2013-09-24 DIAGNOSIS — D638 Anemia in other chronic diseases classified elsewhere: Secondary | ICD-10-CM

## 2013-09-24 DIAGNOSIS — E039 Hypothyroidism, unspecified: Secondary | ICD-10-CM

## 2013-09-24 DIAGNOSIS — B0229 Other postherpetic nervous system involvement: Secondary | ICD-10-CM

## 2013-09-24 HISTORY — DX: Anemia in other chronic diseases classified elsewhere: D63.8

## 2013-09-24 NOTE — Progress Notes (Signed)
Patient ID: Kim Hodges, female   DOB: August 10, 1909, 79 y.o.   MRN: [id number]   Code Status: DNR  No Known Allergies  Chief Complaint  Patient presents with  . Medical Managment of Chronic Issues    cough, BLE edema, PNA    HPI: Patient is a 79 y.o. female seen in the [facility] at [facility] today for evaluation of PNA, BLE edema, and other chronic medical conditions.  Problem List Items Addressed This Visit   Unspecified hypothyroidism     Takes Levothyroxine daily. Will update TSH    Pulmonary hypertension     Per CXR 09/21/13 minimal pulmonary vascular congestion--will obtain BNP and echocardiogram, SOB better after HCTZ 25mg  x1 09/21/13.     Post herpetic neuralgia     Shingles - healed.. Takes Gabapentin 100mg  tid.           PNA (pneumonia) - Primary     Per CXR 09/21/13 left mid lung interstitial pneumonitis: Avelox 400mg  daily for total 10 days along with FloraStor started 09/21/13. Expiratory wheezes presents upon my examination today: will add DuoNeb tid x3 days, then prn for 1week. Medrol dose pack.     Edema     Venous insufficiency vs developing CHF--observe the patient for s/s of developing CHF and obtain BNP/echocardiogram.     Anemia of chronic disease     Stable with Hgb 10-11       Review of Systems:  Review of Systems  Constitutional: Positive for malaise/fatigue. Negative for fever, chills, weight loss and diaphoresis.  HENT: Positive for hearing loss. Negative for congestion, ear discharge, ear pain, nosebleeds, sore throat and tinnitus.        Mild HOH  Eyes: Negative for blurred vision, double vision, pain, discharge and redness.       Glaucoma R+L  Respiratory: Positive for cough, sputum production, shortness of breath and wheezing. Negative for hemoptysis and stridor.   Cardiovascular: Positive for leg swelling. Negative for chest pain, palpitations, orthopnea, claudication and PND.       BLE  Gastrointestinal: Negative for  heartburn, nausea, vomiting, abdominal pain, diarrhea, constipation, blood in stool and melena.  Genitourinary: Positive for frequency. Negative for dysuria, urgency, hematuria and flank pain.  Musculoskeletal: Positive for joint pain. Negative for back pain, falls, myalgias and neck pain.       Ambulates with walker  Skin: Negative for itching and rash.  Neurological: Negative for dizziness, tingling, tremors, sensory change, speech change, focal weakness, seizures, loss of consciousness, weakness and headaches.  Endo/Heme/Allergies: Negative for environmental allergies and polydipsia. Does not bruise/bleed easily.  Psychiatric/Behavioral: Positive for memory loss. Negative for depression, suicidal ideas, hallucinations and substance abuse. The patient is not nervous/anxious and does not have insomnia.      Past Medical History  Diagnosis Date  . Chronic kidney disease, stage II (mild) 12/25/2012  . Unspecified arthropathy, lower leg 09/11/2011  . Swelling of limb 09/10/2012  . Other and unspecified hyperlipidemia 07/26/2011  . Actinic keratosis 07/26/2011  . Abnormality of gait 07/26/2011  . Hyperglycemia 07/26/2011  . Tear film insufficiency, unspecified 12/29/2009  . Other malaise and fatigue 12/29/2009  . Unspecified hearing loss 06/30/2009  . Unspecified hypothyroidism 12/15/2008  . Unspecified essential hypertension 12/15/2008  . Unspecified glaucoma(365.9) 12/15/2008    both eyes  . Weak 08/02/13  . Abnormality of gait 08/02/13  . Hyponatremia 08/02/13  . Herpes zoster 08/02/13  . Post herpetic neuralgia 08/02/13  . Cerebral atrophy 08/02/13  .  Cerebrovascular disease 08/02/13  . Atherosclerosis 08/02/13  . Abnormal chest x-ray 08/02/13    08/02/13 nodule in left mid to upper lung  . Murmur, cardiac 08/02/13    aortic ejection  . Fracture lumbar vertebra-closed 08/02/13    L1  . Memory changes 07/03/13   Past Surgical History  Procedure Laterality Date  . Small intestine  surgery  1957  . Cardiac catheterization  2004  . Gallbladder surgery  2006  . Cholecystectomy     Physical Exam  Constitutional: She is oriented to person, place, and time. She appears well-developed and well-nourished. No distress.  HENT:  Head: Normocephalic and atraumatic.  Right Ear: External ear normal.  Left Ear: External ear normal.  Nose: Nose normal.  Mouth/Throat: Oropharynx is clear and moist. No oropharyngeal exudate.  Eyes: Conjunctivae and EOM are normal. Pupils are equal, round, and reactive to light. Right eye exhibits no discharge. Left eye exhibits no discharge. No scleral icterus.  Neck: Normal range of motion. Neck supple. No JVD present. No tracheal deviation present. No thyromegaly present.  Cardiovascular: Normal rate and regular rhythm.   Murmur heard. 3/6  Pulmonary/Chest: Effort normal. No stridor. No respiratory distress. She has wheezes. She has no rales. She exhibits no tenderness.  Abdominal: Soft. Bowel sounds are normal. She exhibits no distension. There is no tenderness. There is no rebound and no guarding.  Musculoskeletal: Normal range of motion. She exhibits edema. She exhibits no tenderness.  BLE  Lymphadenopathy:    She has no cervical adenopathy.  Neurological: She is alert and oriented to person, place, and time. She displays normal reflexes. No cranial nerve deficit. She exhibits normal muscle tone. Coordination normal.  Skin: Skin is warm and dry. No rash noted. She is not diaphoretic. No erythema. No pallor.  Psychiatric: She has a normal mood and affect. Her behavior is normal. Judgment and thought content normal.    Filed Vitals:   09/24/13 2037  BP: 122/66  Pulse: 68  Temp: 98.4 F (36.9 C)  TempSrc: Tympanic  Resp: 18      Labs reviewed: Basic Metabolic Panel:  Recent Labs  16/10/96 1630 08/02/13 1644 08/03/13 0532 08/03/13 1025 08/04/13 0520 09/22/13  NA 129* 129* 129*  --  129* 138  K 3.4* 4.3 3.2*  --  4.0 3.9  CL  91* 94* 96  --  97  --   CO2 26  --  25  --  23  --   GLUCOSE 102* 103* 112*  --  118*  --   BUN 34* 32* 29*  --  30* 16  CREATININE 0.97 1.20* 0.93  --  0.93 0.9  CALCIUM 9.3  --  8.5  --  8.9  --   TSH  --   --   --  2.243  --   --    Liver Function Tests:  Recent Labs  08/02/13 1630 09/22/13  AST 27 14  ALT 17 8  ALKPHOS 54 55  BILITOT 0.5  --   PROT 7.2  --   ALBUMIN 3.7  --    CBC:  Recent Labs  08/02/13 1630  08/03/13 0532 08/04/13 0520 09/22/13  WBC 10.0  --  7.2 8.1 7.0  NEUTROABS 6.7  --   --   --   --   HGB 12.4  < > 11.2* 10.9* 10.8*  HCT 36.3  < > 32.7* 32.1* 32*  MCV 87.5  --  87.9 89.2  --   PLT  306  --  243 256 250  < > = values in this interval not displayed.  Past Procedures:  09/21/13 CXR mild cardiomegaly, minimal pulmonary vascular congestion, no pleural effusion, old granulomatous disease, there is mild patchy atelectasis or interstitial pneumonitis at the lung bases and left mid lung.    Assessment/Plan PNA (pneumonia) Per CXR 09/21/13 left mid lung interstitial pneumonitis: Avelox 400mg  daily for total 10 days along with FloraStor started 09/21/13. Expiratory wheezes presents upon my examination today: will add DuoNeb tid x3 days, then prn for 1week. Medrol dose pack.   Pulmonary hypertension Per CXR 09/21/13 minimal pulmonary vascular congestion--will obtain BNP and echocardiogram, SOB better after HCTZ 25mg  x1 09/21/13.   Anemia of chronic disease Stable with Hgb 10-11  Unspecified hypothyroidism Takes Levothyroxine daily. Will update TSH  Post herpetic neuralgia Shingles - healed.. Takes Gabapentin 100mg  tid.         Edema Venous insufficiency vs developing CHF--observe the patient for s/s of developing CHF and obtain BNP/echocardiogram.     Family/ Staff Communication: observe the patient.   Goals of Care: AL  Labs/tests ordered: BNP, TSH, Echocardiogram.

## 2013-09-24 NOTE — Assessment & Plan Note (Addendum)
Takes Levothyroxine daily. Will update TSH

## 2013-09-24 NOTE — Assessment & Plan Note (Signed)
Venous insufficiency vs developing CHF--observe the patient for s/s of developing CHF and obtain BNP/echocardiogram.

## 2013-09-24 NOTE — Assessment & Plan Note (Signed)
Shingles - healed.. Takes Gabapentin 100mg  tid.

## 2013-09-24 NOTE — Assessment & Plan Note (Addendum)
Per CXR 09/21/13 left mid lung interstitial pneumonitis: Avelox 400mg  daily for total 10 days along with FloraStor started 09/21/13. Expiratory wheezes presents upon my examination today: will add DuoNeb tid x3 days, then prn for 1week. Medrol dose pack.

## 2013-09-24 NOTE — Assessment & Plan Note (Signed)
Stable with Hgb 10-11

## 2013-09-24 NOTE — Assessment & Plan Note (Signed)
Per CXR 09/21/13 minimal pulmonary vascular congestion--will obtain BNP and echocardiogram, SOB better after HCTZ 25mg  x1 09/21/13.

## 2013-10-01 ENCOUNTER — Non-Acute Institutional Stay: Payer: Medicare PPO | Admitting: Nurse Practitioner

## 2013-10-01 DIAGNOSIS — I10 Essential (primary) hypertension: Secondary | ICD-10-CM

## 2013-10-01 DIAGNOSIS — E039 Hypothyroidism, unspecified: Secondary | ICD-10-CM

## 2013-10-01 DIAGNOSIS — R609 Edema, unspecified: Secondary | ICD-10-CM

## 2013-10-01 DIAGNOSIS — I2789 Other specified pulmonary heart diseases: Secondary | ICD-10-CM

## 2013-10-01 DIAGNOSIS — B0229 Other postherpetic nervous system involvement: Secondary | ICD-10-CM

## 2013-10-01 DIAGNOSIS — I272 Pulmonary hypertension, unspecified: Secondary | ICD-10-CM

## 2013-10-01 DIAGNOSIS — J189 Pneumonia, unspecified organism: Secondary | ICD-10-CM

## 2013-10-03 ENCOUNTER — Encounter: Payer: Self-pay | Admitting: Nurse Practitioner

## 2013-10-03 NOTE — Assessment & Plan Note (Signed)
Per CXR 09/21/13 minimal pulmonary vascular congestion--BNP 697.5 09/29/13 and echocardiogram 09/29/13 showed moderate pulmonary hypertension, SOB better after HCTZ 25mg  x1 09/21/13.

## 2013-10-03 NOTE — Assessment & Plan Note (Signed)
Controlled w/o antihypertensive agent.   

## 2013-10-03 NOTE — Assessment & Plan Note (Signed)
Shingles - healed.. Takes Gabapentin 100mg tid.        

## 2013-10-03 NOTE — Progress Notes (Signed)
Patient ID: Kim Hodges, female   DOB: Aug 04, 1909, 79 y.o.   MRN: [id number]   Code Status: DNR  No Known Allergies  Chief Complaint  Patient presents with  . Medical Managment of Chronic Issues    CHF  . Acute Visit    HPI: Patient is a 79 y.o. female seen in the [facility] at [facility] today for evaluation of CHF and other chronic medical conditions.  Problem List Items Addressed This Visit   Unspecified hypothyroidism - Primary     Takes Levothyroxine daily.       Unspecified essential hypertension     Controlled w/o antihypertensive agent      Post herpetic neuralgia     Shingles - healed.. Takes Gabapentin 100mg  tid.             PNA (pneumonia)     Per CXR 09/21/13 left mid lung interstitial pneumonitis: completed Avelox 400mg  daily for total 10 days along with FloraStor started 09/21/13. Resolved dxpiratory wheezes after DuoNeb tid x3 days, then prn for 1week. Medrol dose pack. Less edematous BLE.       Pulmonary hypertension     Per CXR 09/21/13 minimal pulmonary vascular congestion--BNP 697.5 09/29/13 and echocardiogram 09/29/13 showed moderate pulmonary hypertension, SOB better after HCTZ 25mg  x1 09/21/13.       Edema     Venous insufficiency vs developing CHF--observe the patient for s/s of developing CHF-less edematous BLE. EF 75%, moderate pulmonary hypertension 09/29/13, BNP 697.5-diastolic CHF compensated clinically.          Review of Systems: Review of Systems  Constitutional: Positive for malaise/fatigue. Negative for fever, chills, weight loss and diaphoresis.  HENT: Positive for hearing loss. Negative for congestion, ear discharge, ear pain, nosebleeds, sore throat and tinnitus.        Mild HOH  Eyes: Negative for blurred vision, double vision, pain, discharge and redness.       Glaucoma R+L  Respiratory: Positive for cough, sputum production, shortness of breath and wheezing. Negative for hemoptysis and stridor.     Cardiovascular: Positive for leg swelling. Negative for chest pain, palpitations, orthopnea, claudication and PND.       BLE  Gastrointestinal: Negative for heartburn, nausea, vomiting, abdominal pain, diarrhea, constipation, blood in stool and melena.  Genitourinary: Positive for frequency. Negative for dysuria, urgency, hematuria and flank pain.  Musculoskeletal: Positive for joint pain. Negative for back pain, falls, myalgias and neck pain.       Ambulates with walker  Skin: Negative for itching and rash.  Neurological: Negative for dizziness, tingling, tremors, sensory change, speech change, focal weakness, seizures, loss of consciousness, weakness and headaches.  Endo/Heme/Allergies: Negative for environmental allergies and polydipsia. Does not bruise/bleed easily.  Psychiatric/Behavioral: Positive for memory loss. Negative for depression, suicidal ideas, hallucinations and substance abuse. The patient is not nervous/anxious and does not have insomnia.      Past Medical History  Diagnosis Date  . Chronic kidney disease, stage II (mild) 12/25/2012  . Unspecified arthropathy, lower leg 09/11/2011  . Swelling of limb 09/10/2012  . Other and unspecified hyperlipidemia 07/26/2011  . Actinic keratosis 07/26/2011  . Abnormality of gait 07/26/2011  . Hyperglycemia 07/26/2011  . Tear film insufficiency, unspecified 12/29/2009  . Other malaise and fatigue 12/29/2009  . Unspecified hearing loss 06/30/2009  . Unspecified hypothyroidism 12/15/2008  . Unspecified essential hypertension 12/15/2008  . Unspecified glaucoma(365.9) 12/15/2008    both eyes  . Weak 08/02/13  . Abnormality of gait 08/02/13  .  Hyponatremia 08/02/13  . Herpes zoster 08/02/13  . Post herpetic neuralgia 08/02/13  . Cerebral atrophy 08/02/13  . Cerebrovascular disease 08/02/13  . Atherosclerosis 08/02/13  . Abnormal chest x-ray 08/02/13    08/02/13 nodule in left mid to upper lung  . Murmur, cardiac 08/02/13    aortic  ejection  . Fracture lumbar vertebra-closed 08/02/13    L1  . Memory changes 07/03/13   Past Surgical History  Procedure Laterality Date  . Small intestine surgery  1957  . Cardiac catheterization  2004  . Gallbladder surgery  2006  . Cholecystectomy     Social History:   reports that she has never smoked. She has never used smokeless tobacco. She reports that she does not drink alcohol or use illicit drugs.  Family History  Problem Relation Age of Onset  . Cancer Sister   . Cancer Brother     pancreatic  . Cancer Son     lung  . Cancer Brother     pancreatic    Medications: Patient's Medications  New Prescriptions   No medications on file  Previous Medications   ACETAMINOPHEN (TYLENOL ARTHRITIS PAIN) 650 MG CR TABLET    Take 650 mg by mouth. Take one tablet every 6 hours as needed for pain   ASPIRIN 81 MG TABLET    Take 81 mg by mouth daily.   CALCIUM CARBONATE-VITAMIN D (CALCIUM 600 + D PO)    Take 1 tablet by mouth daily.   DORZOLAMIDE-TIMOLOL (COSOPT) 22.3-6.8 MG/ML OPHTHALMIC SOLUTION    Place 1 drop into both eyes 2 (two) times daily.    GABAPENTIN (NEURONTIN) 100 MG CAPSULE    Take 100 mg by mouth 3 (three) times daily.   LEVOTHYROXINE (SYNTHROID, LEVOTHROID) 75 MCG TABLET    Take 75 mcg by mouth daily before breakfast.   SPECIALTY VITAMINS PRODUCTS (ICAPS LUTEIN-ZEAXANTHIN PO)    Take 1 tablet by mouth daily.  Modified Medications   No medications on file  Discontinued Medications   No medications on file     Physical Exam: Physical Exam  Constitutional: She is oriented to person, place, and time. She appears well-developed and well-nourished. No distress.  HENT:  Head: Normocephalic and atraumatic.  Right Ear: External ear normal.  Left Ear: External ear normal.  Nose: Nose normal.  Mouth/Throat: Oropharynx is clear and moist. No oropharyngeal exudate.  Eyes: Conjunctivae and EOM are normal. Pupils are equal, round, and reactive to light. Right eye  exhibits no discharge. Left eye exhibits no discharge. No scleral icterus.  Neck: Normal range of motion. Neck supple. No JVD present. No tracheal deviation present. No thyromegaly present.  Cardiovascular: Normal rate and regular rhythm.   Murmur heard. 3/6  Pulmonary/Chest: Effort normal. No stridor. No respiratory distress. She has no wheezes. She has no rales. She exhibits no tenderness.  Abdominal: Soft. Bowel sounds are normal. She exhibits no distension. There is no tenderness. There is no rebound and no guarding.  Musculoskeletal: Normal range of motion. She exhibits edema. She exhibits no tenderness.  BLE  Lymphadenopathy:    She has no cervical adenopathy.  Neurological: She is alert and oriented to person, place, and time. She displays normal reflexes. No cranial nerve deficit. She exhibits normal muscle tone. Coordination normal.  Skin: Skin is warm and dry. No rash noted. She is not diaphoretic. No erythema. No pallor.  Psychiatric: She has a normal mood and affect. Her behavior is normal. Judgment and thought content normal.  Filed Vitals:   10/01/13 1402  BP: 120/64  Pulse: 72  Temp: 97.8 F (36.6 C)  TempSrc: Tympanic  Resp: 19      Labs reviewed: Basic Metabolic Panel:  Recent Labs  16/10/96 1630 08/02/13 1644 08/03/13 0532 08/03/13 1025 08/04/13 0520 09/22/13  NA 129* 129* 129*  --  129* 138  K 3.4* 4.3 3.2*  --  4.0 3.9  CL 91* 94* 96  --  97  --   CO2 26  --  25  --  23  --   GLUCOSE 102* 103* 112*  --  118*  --   BUN 34* 32* 29*  --  30* 16  CREATININE 0.97 1.20* 0.93  --  0.93 0.9  CALCIUM 9.3  --  8.5  --  8.9  --   TSH  --   --   --  2.243  --   --    Liver Function Tests:  Recent Labs  08/02/13 1630 09/22/13  AST 27 14  ALT 17 8  ALKPHOS 54 55  BILITOT 0.5  --   PROT 7.2  --   ALBUMIN 3.7  --    CBC:  Recent Labs  08/02/13 1630  08/03/13 0532 08/04/13 0520 09/22/13  WBC 10.0  --  7.2 8.1 7.0  NEUTROABS 6.7  --   --   --    --   HGB 12.4  < > 11.2* 10.9* 10.8*  HCT 36.3  < > 32.7* 32.1* 32*  MCV 87.5  --  87.9 89.2  --   PLT 306  --  243 256 250  < > = values in this interval not displayed.  Past Procedures:  09/29/13 Echocardiogram: moderate AI, moderate to severe MR, moderate TR, moderate pulmonary hypertension, mild LLH, hyperdynamic LV systolic function, EF 75%.   Assessment/Plan Unspecified hypothyroidism Takes Levothyroxine daily.     Unspecified essential hypertension Controlled w/o antihypertensive agent    Post herpetic neuralgia Shingles - healed.. Takes Gabapentin 100mg  tid.           Edema Venous insufficiency vs developing CHF--observe the patient for s/s of developing CHF-less edematous BLE. EF 75%, moderate pulmonary hypertension 09/29/13, BNP 697.5-diastolic CHF compensated clinically.     Pulmonary hypertension Per CXR 09/21/13 minimal pulmonary vascular congestion--BNP 697.5 09/29/13 and echocardiogram 09/29/13 showed moderate pulmonary hypertension, SOB better after HCTZ 25mg  x1 09/21/13.     PNA (pneumonia) Per CXR 09/21/13 left mid lung interstitial pneumonitis: completed Avelox 400mg  daily for total 10 days along with FloraStor started 09/21/13. Resolved dxpiratory wheezes after DuoNeb tid x3 days, then prn for 1week. Medrol dose pack. Less edematous BLE.       Family/ Staff Communication: observe the patient.   Goals of Care: AL  Labs/tests ordered: none

## 2013-10-03 NOTE — Assessment & Plan Note (Signed)
Per CXR 09/21/13 left mid lung interstitial pneumonitis: completed Avelox 400mg  daily for total 10 days along with FloraStor started 09/21/13. Resolved dxpiratory wheezes after DuoNeb tid x3 days, then prn for 1week. Medrol dose pack. Less edematous BLE.

## 2013-10-03 NOTE — Assessment & Plan Note (Signed)
Takes Levothyroxine daily.

## 2013-10-03 NOTE — Assessment & Plan Note (Signed)
Venous insufficiency vs developing CHF--observe the patient for s/s of developing CHF-less edematous BLE. EF 75%, moderate pulmonary hypertension 09/29/13, BNP 697.5-diastolic CHF compensated clinically.

## 2013-11-26 ENCOUNTER — Encounter: Payer: Self-pay | Admitting: Internal Medicine

## 2013-11-26 ENCOUNTER — Non-Acute Institutional Stay: Payer: Medicare PPO | Admitting: Internal Medicine

## 2013-11-26 VITALS — BP 140/72 | HR 76 | Wt 122.0 lb

## 2013-11-26 DIAGNOSIS — R7309 Other abnormal glucose: Secondary | ICD-10-CM

## 2013-11-26 DIAGNOSIS — R5381 Other malaise: Secondary | ICD-10-CM

## 2013-11-26 DIAGNOSIS — R609 Edema, unspecified: Secondary | ICD-10-CM

## 2013-11-26 DIAGNOSIS — I272 Pulmonary hypertension, unspecified: Secondary | ICD-10-CM

## 2013-11-26 DIAGNOSIS — I700 Atherosclerosis of aorta: Secondary | ICD-10-CM

## 2013-11-26 DIAGNOSIS — B0229 Other postherpetic nervous system involvement: Secondary | ICD-10-CM

## 2013-11-26 DIAGNOSIS — R739 Hyperglycemia, unspecified: Secondary | ICD-10-CM

## 2013-11-26 DIAGNOSIS — I10 Essential (primary) hypertension: Secondary | ICD-10-CM

## 2013-11-26 DIAGNOSIS — R531 Weakness: Secondary | ICD-10-CM

## 2013-11-26 DIAGNOSIS — R5383 Other fatigue: Secondary | ICD-10-CM

## 2013-11-26 DIAGNOSIS — I2789 Other specified pulmonary heart diseases: Secondary | ICD-10-CM

## 2013-11-26 DIAGNOSIS — J189 Pneumonia, unspecified organism: Secondary | ICD-10-CM

## 2013-11-26 MED ORDER — GABAPENTIN 100 MG PO CAPS: ORAL_CAPSULE | ORAL | Status: DC

## 2013-11-26 NOTE — Progress Notes (Signed)
Patient ID: Kim FossaMary P Hodges, female   DOB: 03/25/1909, 80 y.o.   MRN: [id number]    Location:  [facility]   Place of Service: Clinic (12)    No Known Allergies  Chief Complaint  Patient presents with  . Medical Managment of Chronic Issues    blood pressure, thyroid, edema.  With son Dorene SorrowJerry today    HPI:  Unspecified essential hypertension: controlled  Pulmonary hypertension: noted on 2 D echo  Atherosclerosis of aorta: noted on 2D Echo  PNA (pneumonia): resoved  Post herpetic neuralgia: improved  Weakness: generalized. Could be related to gabapentin.  Hyperglycemia: most recent lab is normal  Edema: stable at 2+ bipedal    Medications: Patient's Medications  New Prescriptions   No medications on file  Previous Medications   ACETAMINOPHEN (TYLENOL ARTHRITIS PAIN) 650 MG CR TABLET    Take 650 mg by mouth. Take one tablet every 6 hours as needed for pain   ASPIRIN 81 MG TABLET    Take 81 mg by mouth daily.   CALCIUM CARBONATE-VITAMIN D (CALCIUM 600 + D PO)    Take 1 tablet by mouth daily.   DORZOLAMIDE-TIMOLOL (COSOPT) 22.3-6.8 MG/ML OPHTHALMIC SOLUTION    Place 1 drop into both eyes 2 (two) times daily.    GABAPENTIN (NEURONTIN) 100 MG CAPSULE    Take 100 mg by mouth 3 (three) times daily.   LEVOTHYROXINE (SYNTHROID, LEVOTHROID) 75 MCG TABLET    Take 75 mcg by mouth daily before breakfast.   SPECIALTY VITAMINS PRODUCTS (ICAPS LUTEIN-ZEAXANTHIN PO)    Take 1 tablet by mouth daily.  Modified Medications   No medications on file  Discontinued Medications   No medications on file     Review of Systems  Constitutional: Negative for fever, activity change, appetite change, fatigue and unexpected weight change.  HENT: Positive for hearing loss. Negative for ear pain.   Eyes: Positive for visual disturbance.  Respiratory: Negative for cough, choking, shortness of breath and wheezing.   Cardiovascular: Positive for leg swelling. Negative for chest pain and  palpitations.  Gastrointestinal: Negative.   Endocrine: Negative.   Musculoskeletal:       Generalized weakness. No falls. Uses walker.  Allergic/Immunologic: Negative.   Neurological: Positive for weakness.  Hematological: Negative.   Psychiatric/Behavioral: Negative.     Filed Vitals:   11/26/13 1408  BP: 140/72  Pulse: 76  Weight: 122 lb (55.339 kg)   Physical Exam  Constitutional: She is oriented to person, place, and time. She appears well-developed and well-nourished. No distress.  HENT:  Head: Normocephalic.  Nose: Nose normal.  Loss of hearing bilaterally.  Eyes: Conjunctivae and EOM are normal. Pupils are equal, round, and reactive to light.  Corrective lenses  Neck: Neck supple. No JVD present. No tracheal deviation present. No thyromegaly present.  Cardiovascular: Normal rate, regular rhythm and intact distal pulses.  Exam reveals no friction rub.   Murmur (2/6 aortic ejecton murmur) heard. Pulmonary/Chest: Effort normal and breath sounds normal. No respiratory distress. She has no wheezes. She has no rales.  Abdominal: She exhibits no distension and no mass. There is no tenderness.  Musculoskeletal: Normal range of motion. She exhibits edema. She exhibits no tenderness.  Unstable gait.  Lymphadenopathy:    She has no cervical adenopathy.  Neurological: She is alert and oriented to person, place, and time. She has normal reflexes. No cranial nerve deficit. Coordination normal.  07/09/13 MMSE: 21/30; failed clock drawing Hypersensitive area of scarring of the left  medial thigh.  Skin: Skin is dry. Rash (post shingles in left medial thigh; resolving) noted. No erythema. No pallor.  Extensive scarring of the left thigh medially from shingles.  Psychiatric: She has a normal mood and affect. Her behavior is normal. Judgment and thought content normal.     Labs reviewed: [facility] Home on 09/24/2013  Component Date Value Range Status  . Hemoglobin 09/22/2013 10.8* 12.0  - 16.0 g/dL Final  . HCT 11/91/4782 32* 36 - 46 % Final  . Platelets 09/22/2013 250  150 - 399 K/L Final  . WBC 09/22/2013 7.0   Final  . Glucose 09/22/2013 89   Final  . BUN 09/22/2013 16  4 - 21 mg/dL Final  . Creatinine 95/62/1308 0.9  .5 - 1.1 mg/dL Final  . Potassium 65/78/4696 3.9  3.4 - 5.3 mmol/L Final  . Sodium 09/22/2013 138  137 - 147 mmol/L Final  . Alkaline Phosphatase 09/22/2013 55  25 - 125 U/L Final  . ALT 09/22/2013 8  7 - 35 U/L Final  . AST 09/22/2013 14  13 - 35 U/L Final  . Bilirubin, Total 09/22/2013 0.6   Final   11/17/13 BMP: nl  TSH: 1.893  A1c 5.9 09/28/13 2 D echo: LVH. LVEF 75%.Moderat to severe MR. AoV sclerosed and with moderate AR.Moderate pulmonary HTN. 09/29/13 BNP 697.5   Assessment/Plan  1. Unspecified essential hypertension controlled  2. Pulmonary hypertension stable  3. Atherosclerosis of aorta stable  4. PNA (pneumonia) resolved  5. Post herpetic neuralgia improved - reduce gabapentin (NEURONTIN) 100 MG capsule; 2 at bed to help neuralgia  Dispense: 60 capsule; Refill: 5  6. Weakness Possibly aggravated by gabapentin  7. Hyperglycemia normal  8. Edema unchanged

## 2014-03-18 ENCOUNTER — Non-Acute Institutional Stay: Payer: Medicare PPO | Admitting: Internal Medicine

## 2014-03-18 ENCOUNTER — Encounter: Payer: Self-pay | Admitting: Internal Medicine

## 2014-03-18 VITALS — BP 124/64 | HR 72 | Wt 118.0 lb

## 2014-03-18 DIAGNOSIS — Z299 Encounter for prophylactic measures, unspecified: Secondary | ICD-10-CM

## 2014-03-18 DIAGNOSIS — I10 Essential (primary) hypertension: Secondary | ICD-10-CM

## 2014-03-18 DIAGNOSIS — R413 Other amnesia: Secondary | ICD-10-CM

## 2014-03-18 DIAGNOSIS — E039 Hypothyroidism, unspecified: Secondary | ICD-10-CM

## 2014-03-18 DIAGNOSIS — R609 Edema, unspecified: Secondary | ICD-10-CM

## 2014-03-18 DIAGNOSIS — B0229 Other postherpetic nervous system involvement: Secondary | ICD-10-CM

## 2014-03-18 DIAGNOSIS — R269 Unspecified abnormalities of gait and mobility: Secondary | ICD-10-CM

## 2014-03-18 NOTE — Progress Notes (Signed)
Patient ID: Kim Hodges, female   DOB: 01/18/1909, [age] y.o.   MRN: [id number]    Location:  [facility]  Place of Service: CLINIC   No Known Allergies  Chief Complaint  Patient presents with  . Medical Management of Chronic Issues    blood pressure, neuralgia, edema. With son Dorene SorrowJerry     HPI:  Preventive measure - Plan: DNR (Do Not Resuscitate)  Post herpetic neuralgia: continues to have intermittent pains  Memory loss: unchanged MMSE 21/30  Unspecified hypothyroidism: compensated  Unspecified essential hypertension: controlled. Off all medications now  Edema: chronic and unchanged. She does not like wearing the compression hosiery.  Abnormality of gait: 4 wheel walker with seat and brakes.    Medications: Patient's Medications  New Prescriptions   No medications on file  Previous Medications   ACETAMINOPHEN (TYLENOL ARTHRITIS PAIN) 650 MG CR TABLET    Take 650 mg by mouth. Take one tablet every 6 hours as needed for pain   ASPIRIN 81 MG TABLET    Take 81 mg by mouth daily.   CALCIUM CARBONATE-VITAMIN D (CALCIUM 600 + D PO)    Take 1 tablet by mouth daily.   DORZOLAMIDE-TIMOLOL (COSOPT) 22.3-6.8 MG/ML OPHTHALMIC SOLUTION    Place 1 drop into both eyes 2 (two) times daily.    GABAPENTIN (NEURONTIN) 100 MG CAPSULE    2 at bed to help neuralgia   LEVOTHYROXINE (SYNTHROID, LEVOTHROID) 75 MCG TABLET    Take 75 mcg by mouth daily before breakfast.   SPECIALTY VITAMINS PRODUCTS (ICAPS LUTEIN-ZEAXANTHIN PO)    Take 1 tablet by mouth daily.  Modified Medications   No medications on file  Discontinued Medications   No medications on file     Review of Systems  Constitutional: Negative for fever, activity change, appetite change, fatigue and unexpected weight change.  HENT: Positive for hearing loss. Negative for ear pain.   Eyes: Positive for visual disturbance.  Respiratory: Negative for cough, choking, shortness of breath and wheezing.   Cardiovascular: Positive for leg  swelling. Negative for chest pain and palpitations.  Gastrointestinal: Negative.   Endocrine: Negative.   Musculoskeletal:       Generalized weakness. No falls. Uses walker.  Allergic/Immunologic: Negative.   Neurological: Positive for weakness.  Hematological: Negative.   Psychiatric/Behavioral: Negative.     Filed Vitals:   03/18/14 1436  BP: 124/64  Pulse: 72  Weight: 118 lb (53.524 kg)   Body mass index is 23.82 kg/(m^2).  Physical Exam  Constitutional: She is oriented to person, place, and time. She appears well-developed and well-nourished. No distress.  HENT:  Head: Normocephalic.  Nose: Nose normal.  Loss of hearing bilaterally.  Eyes: Conjunctivae and EOM are normal. Pupils are equal, round, and reactive to light.  Corrective lenses  Neck: Neck supple. No JVD present. No tracheal deviation present. No thyromegaly present.  Cardiovascular: Normal rate, regular rhythm and intact distal pulses.  Exam reveals no friction rub.   Murmur (2/6 aortic ejecton murmur) heard. Pulmonary/Chest: Effort normal and breath sounds normal. No respiratory distress. She has no wheezes. She has no rales.  Abdominal: She exhibits no distension and no mass. There is no tenderness.  Musculoskeletal: Normal range of motion. She exhibits edema. She exhibits no tenderness.  Unstable gait.  Lymphadenopathy:    She has no cervical adenopathy.  Neurological: She is alert and oriented to person, place, and time. She has normal reflexes. No cranial nerve deficit. Coordination normal.  07/09/13 MMSE: 21/30; failed clock  drawing Hypersensitive area of scarring of the left medial thigh.  Skin: Skin is dry. Rash (post shingles in left medial thigh; resolving) noted. No erythema. No pallor.  Extensive scarring of the left thigh medially from shingles.  Psychiatric: She has a normal mood and affect. Her behavior is normal. Judgment and thought content normal.     Labs reviewed: No visits with results  within 3 Month(s) from this visit. Latest known visit with results is:  [facility] Home on 09/24/2013  Component Date Value Ref Range Status  . Hemoglobin 09/22/2013 10.8* 12.0 - 16.0 g/dL Final  . HCT 70/48/8891 32* 36 - 46 % Final  . Platelets 09/22/2013 250  150 - 399 K/L Final  . WBC 09/22/2013 7.0   Final  . Glucose 09/22/2013 89   Final  . BUN 09/22/2013 16  4 - 21 mg/dL Final  . Creatinine 69/45/0388 0.9  .5 - 1.1 mg/dL Final  . Potassium 82/80/0349 3.9  3.4 - 5.3 mmol/L Final  . Sodium 09/22/2013 138  137 - 147 mmol/L Final  . Alkaline Phosphatase 09/22/2013 55  25 - 125 U/L Final  . ALT 09/22/2013 8  7 - 35 U/L Final  . AST 09/22/2013 14  13 - 35 U/L Final  . Bilirubin, Total 09/22/2013 0.6   Final      Assessment/Plan  1. Preventive measure - DNR (Do Not Resuscitate)  2. Post herpetic neuralgia Unchanged. Using Neurontin HS  3. Memory loss unchanged  4. Unspecified hypothyroidism TSH in next week  5. Unspecified essential hypertension Controlled without medication  6. Edema unchanged  7. Abnormality of gait Continue use of walker for stability

## 2014-03-23 LAB — TSH: TSH: 2.55 u[IU]/mL (ref 0.41–5.90)

## 2014-03-25 ENCOUNTER — Other Ambulatory Visit: Payer: Self-pay | Admitting: Nurse Practitioner

## 2014-03-25 DIAGNOSIS — E039 Hypothyroidism, unspecified: Secondary | ICD-10-CM

## 2014-05-19 ENCOUNTER — Other Ambulatory Visit: Payer: Self-pay | Admitting: Internal Medicine

## 2014-05-19 ENCOUNTER — Encounter: Payer: Self-pay | Admitting: Nurse Practitioner

## 2014-05-19 DIAGNOSIS — R9389 Abnormal findings on diagnostic imaging of other specified body structures: Secondary | ICD-10-CM

## 2014-05-19 DIAGNOSIS — R918 Other nonspecific abnormal finding of lung field: Secondary | ICD-10-CM

## 2014-05-20 ENCOUNTER — Non-Acute Institutional Stay: Payer: Medicare PPO | Admitting: Nurse Practitioner

## 2014-05-20 ENCOUNTER — Encounter: Payer: Self-pay | Admitting: Nurse Practitioner

## 2014-05-20 DIAGNOSIS — I10 Essential (primary) hypertension: Secondary | ICD-10-CM

## 2014-05-20 DIAGNOSIS — M898X1 Other specified disorders of bone, shoulder: Secondary | ICD-10-CM

## 2014-05-20 DIAGNOSIS — E039 Hypothyroidism, unspecified: Secondary | ICD-10-CM

## 2014-05-20 DIAGNOSIS — B0229 Other postherpetic nervous system involvement: Secondary | ICD-10-CM

## 2014-05-20 DIAGNOSIS — M899 Disorder of bone, unspecified: Secondary | ICD-10-CM

## 2014-05-20 DIAGNOSIS — R609 Edema, unspecified: Secondary | ICD-10-CM

## 2014-05-20 DIAGNOSIS — M949 Disorder of cartilage, unspecified: Secondary | ICD-10-CM

## 2014-05-20 DIAGNOSIS — N182 Chronic kidney disease, stage 2 (mild): Secondary | ICD-10-CM

## 2014-05-20 DIAGNOSIS — D638 Anemia in other chronic diseases classified elsewhere: Secondary | ICD-10-CM

## 2014-05-20 LAB — CBC AND DIFFERENTIAL
HEMATOCRIT: 34 % — AB (ref 36–46)
HEMOGLOBIN: 11.9 g/dL — AB (ref 12.0–16.0)
Platelets: 281 10*3/uL (ref 150–399)
WBC: 8.2 10*3/mL

## 2014-05-20 LAB — BASIC METABOLIC PANEL
BUN: 25 mg/dL — AB (ref 4–21)
Creatinine: 1 mg/dL (ref 0.5–1.1)
Glucose: 101 mg/dL
Potassium: 4.1 mmol/L (ref 3.4–5.3)
SODIUM: 135 mmol/L — AB (ref 137–147)

## 2014-05-20 LAB — HEPATIC FUNCTION PANEL
ALT: 8 U/L (ref 7–35)
AST: 18 U/L (ref 13–35)
Alkaline Phosphatase: 67 U/L (ref 25–125)
BILIRUBIN, TOTAL: 0.5 mg/dL

## 2014-05-20 LAB — TSH: TSH: 3.22 u[IU]/mL (ref 0.41–5.90)

## 2014-05-20 NOTE — Assessment & Plan Note (Signed)
Controlled w/o meds 

## 2014-05-20 NOTE — Assessment & Plan Note (Addendum)
Takes Levothyroxine 75mcg daily. 03/23/14 TSH 2.553. 05/20/14 TSH 3.224   

## 2014-05-20 NOTE — Progress Notes (Signed)
Patient ID: Kim Hodges, female   DOB: Mar 19, 1909, 80 y.o.   MRN: [id number]   Code Status: DNR  No Known Allergies  Chief Complaint  Patient presents with  . Medical Management of Chronic Issues  . Acute Visit    left shoulder blade and right arm pain.     HPI: Patient is a 80 y.o. female seen in the [facility] at [facility] today for evaluation of left shoulder blade pain and other chronic medical conditions.  Problem List Items Addressed This Visit   Chronic kidney disease, stage II (mild)     05/20/14 Bun/creat 25/0.96    Hypothyroidism     Takes Levothyroxine daily. 03/23/14 TSH 2.553. 05/20/14 TSH 3.224        Unspecified essential hypertension     Controlled w/o meds.     Post herpetic neuralgia     hingles - healed.. Takes Gabapentin 200mg  nightly and 100mg  am.      Relevant Medications      gabapentin (NEURONTIN) 100 MG capsule   Anemia of chronic disease     05/20/14 Hgb 11.9     Edema     Not apparent today.     Pain of left scapula - Primary     A few days after her fall in her bathroom. Small faded yellow bruise noted on her right shoulder. Pain is constant, moderate, travels across upper thoracic spine to the medal aspect of the right scapula and the right arm. No aggravating or relieving factors-such as deep breath or ROM. Scheduled Tylenol and and increased Gabapentin from 200mg  nightly to 100mg  am and 200mg  pm in the past 24 hours with little efficacy. Will add prn Norco 5/325mg  1/2 q6h prn for pain control. Scheduled CT chest 05/24/14. Hx of left mil-upper chest mass-re demonstrated CXR 05/17/14. CBC and CMP pending. X-ray L wrist, forearm, elbow, humerus, and shoulder 05/19/14 unremarkable.  Apply MyoFlax for local analgesic purpose. Adding Omeprazole 20mg  daily for GI protection.        Review of Systems: Review of Systems  Constitutional: Positive for malaise/fatigue. Negative for fever, chills, weight loss and diaphoresis.  HENT:  Positive for hearing loss. Negative for congestion, ear discharge, ear pain, nosebleeds, sore throat and tinnitus.        Mild HOH  Eyes: Negative for blurred vision, double vision, pain, discharge and redness.       Glaucoma R+L  Respiratory: Negative for cough, hemoptysis, sputum production, shortness of breath, wheezing and stridor.   Cardiovascular: Negative for chest pain, palpitations, orthopnea, claudication, leg swelling and PND.  Gastrointestinal: Negative for heartburn, nausea, vomiting, abdominal pain, diarrhea, constipation, blood in stool and melena.  Genitourinary: Positive for frequency. Negative for dysuria, urgency, hematuria and flank pain.  Musculoskeletal: Positive for falls and joint pain. Negative for back pain, myalgias and neck pain.       Ambulates with walker. C/o upper left scapular area pain for a 5-6 days following a fall in her bathroom a few days earlier. Constant pain in moderate severity. Woke up a few times last night due to her discomfort. Pain is aching in nature and radiates acrose her upper thoracic spine to the  right upper scapula at the same level and to the left arm.   Skin: Negative for itching and rash.  Neurological: Negative for dizziness, tingling, tremors, sensory change, speech change, focal weakness, seizures, loss of consciousness, weakness and headaches.  Endo/Heme/Allergies: Negative for environmental allergies and polydipsia. Does not  bruise/bleed easily.  Psychiatric/Behavioral: Positive for memory loss. Negative for depression, suicidal ideas, hallucinations and substance abuse. The patient is not nervous/anxious and does not have insomnia.      Past Medical History  Diagnosis Date  . Chronic kidney disease, stage II (mild) 12/25/2012  . Unspecified arthropathy, lower leg 09/11/2011  . Swelling of limb 09/10/2012  . Other and unspecified hyperlipidemia 07/26/2011  . Actinic keratosis 07/26/2011  . Abnormality of gait 07/26/2011  .  Hyperglycemia 07/26/2011  . Tear film insufficiency, unspecified 12/29/2009  . Other malaise and fatigue 12/29/2009  . Unspecified hearing loss 06/30/2009  . Unspecified hypothyroidism 12/15/2008  . Unspecified essential hypertension 12/15/2008  . Unspecified glaucoma 12/15/2008    both eyes  . Weak 08/02/13  . Abnormality of gait 08/02/13  . Hyponatremia 08/02/13  . Herpes zoster 08/02/13  . Post herpetic neuralgia 08/02/13  . Cerebral atrophy 08/02/13  . Cerebrovascular disease 08/02/13  . Atherosclerosis 08/02/13  . Abnormal chest x-ray 08/02/13    08/02/13 nodule in left mid to upper lung  . Murmur, cardiac 08/02/13    aortic ejection  . Fracture lumbar vertebra-closed 08/02/13    L1  . Memory changes 07/03/13   Past Surgical History  Procedure Laterality Date  . Small intestine surgery  1957  . Cardiac catheterization  2004  . Gallbladder surgery  2006  . Cholecystectomy     Social History:   reports that she has never smoked. She has never used smokeless tobacco. She reports that she does not drink alcohol or use illicit drugs.  Family History  Problem Relation Age of Onset  . Cancer Sister   . Cancer Brother     pancreatic  . Cancer Son     lung  . Cancer Brother     pancreatic    Medications: Patient's Medications  New Prescriptions   No medications on file  Previous Medications   ACETAMINOPHEN (TYLENOL ARTHRITIS PAIN) 650 MG CR TABLET    Take 650 mg by mouth. Take one tablet every 6 hours as needed for pain   ASPIRIN 81 MG TABLET    Take 81 mg by mouth daily.   CALCIUM CARBONATE-VITAMIN D (CALCIUM 600 + D PO)    Take 1 tablet by mouth daily.   DORZOLAMIDE-TIMOLOL (COSOPT) 22.3-6.8 MG/ML OPHTHALMIC SOLUTION    Place 1 drop into both eyes 2 (two) times daily.    HYDROCODONE-ACETAMINOPHEN (NORCO/VICODIN) 5-325 MG PER TABLET    Take 0.5 tablets by mouth every 6 (six) hours as needed for moderate pain.   LEVOTHYROXINE (SYNTHROID, LEVOTHROID) 75 MCG TABLET    Take 75  mcg by mouth daily before breakfast.   OMEPRAZOLE (PRILOSEC) 20 MG CAPSULE    Take 20 mg by mouth daily.   SPECIALTY VITAMINS PRODUCTS (ICAPS LUTEIN-ZEAXANTHIN PO)    Take 1 tablet by mouth daily.  Modified Medications   Modified Medication Previous Medication   GABAPENTIN (NEURONTIN) 100 MG CAPSULE gabapentin (NEURONTIN) 100 MG capsule      100 mg every morning. 2 at bed to help neuralgia    2 at bed to help neuralgia  Discontinued Medications   No medications on file     Physical Exam: Physical Exam  Constitutional: She is oriented to person, place, and time. She appears well-developed and well-nourished. No distress.  HENT:  Head: Normocephalic and atraumatic.  Right Ear: External ear normal.  Left Ear: External ear normal.  Nose: Nose normal.  Mouth/Throat: Oropharynx is clear and moist. No oropharyngeal  exudate.  Eyes: Conjunctivae and EOM are normal. Pupils are equal, round, and reactive to light. Right eye exhibits no discharge. Left eye exhibits no discharge. No scleral icterus.  Neck: Normal range of motion. Neck supple. No JVD present. No tracheal deviation present. No thyromegaly present.  Cardiovascular: Normal rate and regular rhythm.   Murmur heard. 3/6  Pulmonary/Chest: Effort normal. No stridor. No respiratory distress. She has no wheezes. She has no rales. She exhibits no tenderness.  Abdominal: Soft. Bowel sounds are normal. She exhibits no distension. There is no tenderness. There is no rebound and no guarding.  Musculoskeletal: Normal range of motion. She exhibits no edema and no tenderness.  Ambulates with walker. C/o upper left scapular area pain for a 5-6 days following a fall in her bathroom a few days earlier. Constant pain in moderate severity. Woke up a few times last night due to her discomfort. Pain is aching in nature and radiates acrose her upper thoracic spine to the  right upper scapula at the same level and to the left arm.  Lymphadenopathy:    She has  no cervical adenopathy.  Neurological: She is alert and oriented to person, place, and time. She displays normal reflexes. No cranial nerve deficit. She exhibits normal muscle tone. Coordination normal.  Skin: Skin is warm and dry. No rash noted. She is not diaphoretic. No erythema. No pallor.  Psychiatric: She has a normal mood and affect. Her behavior is normal. Judgment and thought content normal.    Filed Vitals:   05/20/14 1118  BP: 136/70  Pulse: 60  Temp: 98 F (36.7 C)  TempSrc: Tympanic  Resp: 20      Labs reviewed: Basic Metabolic Panel:  Recent Labs  16/10/96 1630 08/02/13 1644 08/03/13 0532  08/04/13 0520 09/22/13 03/23/14 05/20/14  NA 129* 129* 129*  --  129* 138  --  135*  K 3.4* 4.3 3.2*  --  4.0 3.9  --  4.1  CL 91* 94* 96  --  97  --   --   --   CO2 26  --  25  --  23  --   --   --   GLUCOSE 102* 103* 112*  --  118*  --   --   --   BUN 34* 32* 29*  --  30* 16  --  25*  CREATININE 0.97 1.20* 0.93  --  0.93 0.9  --  1.0  CALCIUM 9.3  --  8.5  --  8.9  --   --   --   TSH  --   --   --   < >  --   --  2.55 3.22  < > = values in this interval not displayed. Liver Function Tests:  Recent Labs  08/02/13 1630 09/22/13 05/20/14  AST 27 14 18   ALT 17 8 8   ALKPHOS 54 55 67  BILITOT 0.5  --   --   PROT 7.2  --   --   ALBUMIN 3.7  --   --    CBC:  Recent Labs  08/02/13 1630  08/03/13 0532 08/04/13 0520 09/22/13 05/20/14  WBC 10.0  --  7.2 8.1 7.0 8.2  NEUTROABS 6.7  --   --   --   --   --   HGB 12.4  < > 11.2* 10.9* 10.8* 11.9*  HCT 36.3  < > 32.7* 32.1* 32* 34*  MCV 87.5  --  87.9 89.2  --   --  PLT 306  --  243 256 250 281  < > = values in this interval not displayed.  Past Procedures:  09/29/13 Echocardiogram: moderate AI, moderate to severe MR, moderate TR, moderate pulmonary hypertension, mild LLH, hyperdynamic LV systolic function, EF 75%.   Assessment/Plan Pain of left scapula A few days after her fall in her bathroom. Small faded  yellow bruise noted on her right shoulder. Pain is constant, moderate, travels across upper thoracic spine to the medal aspect of the right scapula and the right arm. No aggravating or relieving factors-such as deep breath or ROM. Scheduled Tylenol and and increased Gabapentin from 200mg  nightly to 100mg  am and 200mg  pm in the past 24 hours with little efficacy. Will add prn Norco 5/325mg  1/2 q6h prn for pain control. Scheduled CT chest 05/24/14. Hx of left mil-upper chest mass-re demonstrated CXR 05/17/14. CBC and CMP pending. X-ray L wrist, forearm, elbow, humerus, and shoulder 05/19/14 unremarkable.  Apply MyoFlax for local analgesic purpose. Adding Omeprazole 20mg  daily for GI protection.   Hypothyroidism Takes Levothyroxine 75mcg daily. 03/23/14 TSH 2.553. 05/20/14 TSH 3.224      Unspecified essential hypertension Controlled w/o meds.   Post herpetic neuralgia hingles - healed.. Takes Gabapentin 200mg  nightly and 100mg  am.    Chronic kidney disease, stage II (mild) 05/20/14 Bun/creat 25/0.96  Anemia of chronic disease 05/20/14 Hgb 11.9   Edema Not apparent today.     Family/ Staff Communication: observe the patient.   Goals of Care: AL  Labs/tests ordered: CBC and CMP done 05/20/14. X-ray CXR, L shoulder, humerus, elbow, forearm, and wrist.

## 2014-05-20 NOTE — Assessment & Plan Note (Signed)
Not apparent today.   

## 2014-05-20 NOTE — Assessment & Plan Note (Addendum)
A few days after her fall in her bathroom. Small faded yellow bruise noted on her right shoulder. Pain is constant, moderate, travels across upper thoracic spine to the medal aspect of the right scapula and the right arm. No aggravating or relieving factors-such as deep breath or ROM. Scheduled Tylenol and and increased Gabapentin from 200mg  nightly to 100mg  am and 200mg  pm in the past 24 hours with little efficacy. Will add prn Norco 5/325mg  1/2 q6h prn for pain control. Scheduled CT chest 05/24/14. Hx of left mil-upper chest mass-re demonstrated CXR 05/17/14. CBC and CMP pending. X-ray L wrist, forearm, elbow, humerus, and shoulder 05/19/14 unremarkable.  Apply MyoFlax for local analgesic purpose. Adding Omeprazole 20mg  daily for GI protection.

## 2014-05-20 NOTE — Assessment & Plan Note (Signed)
05/20/14 Bun/creat 25/0.96

## 2014-05-20 NOTE — Assessment & Plan Note (Signed)
hingles - healed.. Takes Gabapentin 200mg  nightly and 100mg  am.

## 2014-05-20 NOTE — Assessment & Plan Note (Signed)
05/20/14 Hgb 11.9

## 2014-05-24 ENCOUNTER — Ambulatory Visit
Admission: RE | Admit: 2014-05-24 | Discharge: 2014-05-24 | Disposition: A | Discharge status: Home / Self Care | Payer: Medicare PPO | Source: Ambulatory Visit | Attending: Internal Medicine | Admitting: Internal Medicine

## 2014-05-24 DIAGNOSIS — R918 Other nonspecific abnormal finding of lung field: Secondary | ICD-10-CM

## 2014-05-24 DIAGNOSIS — R9389 Abnormal findings on diagnostic imaging of other specified body structures: Secondary | ICD-10-CM

## 2014-05-26 ENCOUNTER — Encounter: Payer: Self-pay | Admitting: Nurse Practitioner

## 2014-05-27 ENCOUNTER — Non-Acute Institutional Stay: Payer: Medicare PPO | Admitting: Internal Medicine

## 2014-05-27 VITALS — BP 158/62 | HR 60 | Ht 59.0 in | Wt 112.0 lb

## 2014-05-27 DIAGNOSIS — R5381 Other malaise: Secondary | ICD-10-CM

## 2014-05-27 DIAGNOSIS — R911 Solitary pulmonary nodule: Secondary | ICD-10-CM

## 2014-05-27 DIAGNOSIS — D638 Anemia in other chronic diseases classified elsewhere: Secondary | ICD-10-CM

## 2014-05-27 DIAGNOSIS — R634 Abnormal weight loss: Secondary | ICD-10-CM

## 2014-05-27 DIAGNOSIS — I10 Essential (primary) hypertension: Secondary | ICD-10-CM

## 2014-05-27 DIAGNOSIS — R531 Weakness: Secondary | ICD-10-CM

## 2014-05-27 DIAGNOSIS — E039 Hypothyroidism, unspecified: Secondary | ICD-10-CM

## 2014-05-27 DIAGNOSIS — R5383 Other fatigue: Secondary | ICD-10-CM

## 2014-05-27 DIAGNOSIS — R9389 Abnormal findings on diagnostic imaging of other specified body structures: Secondary | ICD-10-CM

## 2014-05-27 DIAGNOSIS — B0229 Other postherpetic nervous system involvement: Secondary | ICD-10-CM

## 2014-05-27 NOTE — Progress Notes (Signed)
Patient ID: Kim Hodges, female   DOB: 11/06/1908, 80 y.o.   MRN: [id number]    Location:  [facility]   Place of Service: Clinic (12)    No Known Allergies  Chief Complaint  Patient presents with  . abnormal chest x-ray    review CT Chest results done 05/24/14,  with patient and family. Here with son Kim Hodges and daughter-in-law Kim Hodges.    HPI:  Loss of weight: may be linked to the abnormal CXR finding that is worrisome for cancer.  Nodule of left lung: CT report worrisome for scar cancer 05/19/14: Enlarging masslike density in the area of prior linear scarring on  2009 chest CT. Findings are concerning for possible scar carcinoma.  This could be further evaluated with PET CT or tissue biopsy if felt  clinically indicated.  Trace left pleural effusion.  Mild cardiomegaly.  Small hiatal hernia.  Borderline low right paratracheal lymph node.   Unspecified essential hypertension: controlled  Anemia of chronic disease: unchanged  Hypothyroidism: compensated  Weak: associated with weight loss    Medications: Patient's Medications  New Prescriptions   No medications on file  Previous Medications   ACETAMINOPHEN (TYLENOL ARTHRITIS PAIN) 650 MG CR TABLET    Take 650 mg by mouth. Take one tablet three times daily, one tablet every 6 hours as needed for pain   ASPIRIN 81 MG TABLET    Take 81 mg by mouth daily.   CALCIUM CARBONATE-VITAMIN D (CALCIUM 600 + D PO)    Take 1 tablet by mouth daily.   DORZOLAMIDE-TIMOLOL (COSOPT) 22.3-6.8 MG/ML OPHTHALMIC SOLUTION    Place 1 drop into both eyes 2 (two) times daily.    GABAPENTIN (NEURONTIN) 100 MG CAPSULE    100 mg every morning. 1 at bed to help neuralgia   HYDROCODONE-ACETAMINOPHEN (NORCO/VICODIN) 5-325 MG PER TABLET    Take 0.5 tablets by mouth every 6 (six) hours as needed for moderate pain.   LEVOTHYROXINE (SYNTHROID, LEVOTHROID) 75 MCG TABLET    Take 75 mcg by mouth daily before breakfast.   OMEPRAZOLE (PRILOSEC) 20  MG CAPSULE    Take 20 mg by mouth daily.   SPECIALTY VITAMINS PRODUCTS (ICAPS LUTEIN-ZEAXANTHIN PO)    Take 1 tablet by mouth daily.  Modified Medications   No medications on file  Discontinued Medications   No medications on file     Review of Systems  Constitutional: Negative for fever, activity change, appetite change, fatigue and unexpected weight change.  HENT: Positive for hearing loss. Negative for ear pain.   Eyes: Positive for visual disturbance.  Respiratory: Negative for cough, choking, shortness of breath and wheezing.   Cardiovascular: Positive for leg swelling. Negative for chest pain and palpitations.  Gastrointestinal: Negative.   Endocrine: Negative.   Musculoskeletal:       Generalized weakness. No falls. Uses walker.  Allergic/Immunologic: Negative.   Neurological: Positive for weakness.  Hematological: Negative.   Psychiatric/Behavioral: Negative.     Filed Vitals:   05/27/14 1409  BP: 158/62  Pulse: 60  Height: 4\' 11"  (1.499 m)  Weight: 112 lb (50.803 kg)  SpO2: 92%   Body mass index is 22.61 kg/(m^2).  Physical Exam  Constitutional: She is oriented to person, place, and time. She appears well-developed and well-nourished. No distress.  HENT:  Head: Normocephalic.  Nose: Nose normal.  Loss of hearing bilaterally.  Eyes: Conjunctivae and EOM are normal. Pupils are equal, round, and reactive to light.  Corrective lenses  Neck: Neck supple.  No JVD present. No tracheal deviation present. No thyromegaly present.  Cardiovascular: Normal rate, regular rhythm and intact distal pulses.  Exam reveals no friction rub.   Murmur (2/6 aortic ejecton murmur) heard. Pulmonary/Chest: Effort normal and breath sounds normal. No respiratory distress. She has no wheezes. She has no rales.  Abdominal: She exhibits no distension and no mass. There is no tenderness.  Musculoskeletal: Normal range of motion. She exhibits edema. She exhibits no tenderness.  Unstable gait.    Lymphadenopathy:    She has no cervical adenopathy.  Neurological: She is alert and oriented to person, place, and time. She has normal reflexes. No cranial nerve deficit. Coordination normal.  07/09/13 MMSE: 21/30; failed clock drawing Hypersensitive area of scarring of the left medial thigh.  Skin: Skin is dry. Rash (post shingles in left medial thigh; resolving) noted. No erythema. No pallor.  Extensive scarring of the left thigh medially from shingles.  Psychiatric: She has a normal mood and affect. Her behavior is normal. Judgment and thought content normal.     Labs reviewed: Nursing Home on 05/20/2014  Component Date Value Ref Range Status  . Hemoglobin 05/20/2014 11.9* 12.0 - 16.0 g/dL Final  . HCT 16/10/960407/30/2015 34* 36 - 46 % Final  . Platelets 05/20/2014 281  150 - 399 K/L Final  . WBC 05/20/2014 8.2   Final  . Glucose 05/20/2014 101   Final  . BUN 05/20/2014 25* 4 - 21 mg/dL Final  . Creatinine 54/09/811907/30/2015 1.0  0.5 - 1.1 mg/dL Final  . Potassium 14/78/295607/30/2015 4.1  3.4 - 5.3 mmol/L Final  . Sodium 05/20/2014 135* 137 - 147 mmol/L Final  . Alkaline Phosphatase 05/20/2014 67  25 - 125 U/L Final  . ALT 05/20/2014 8  7 - 35 U/L Final  . AST 05/20/2014 18  13 - 35 U/L Final  . Bilirubin, Total 05/20/2014 0.5   Final  . TSH 05/20/2014 3.22  0.41 - 5.90 uIU/mL Final  Lab on 03/25/2014  Component Date Value Ref Range Status  . TSH 03/23/2014 2.55  0.41 - 5.90 uIU/mL Final      Assessment/Plan  1. Loss of weight Likely related to abnormal mass in the chest  2. Nodule of left lung Possible cancer. At age [age] yrs, and with multiple co-morbidities, there is little in my mind that should be done. She is not a good candidate for biopsy because of pulmonary and cardiac co-morbidities. PET scan could be done for further evaluation, but then what is to be done with the information. I think the simplest approach for establishing the potential direction of growth, is to get another CXR or  CT scan at the end of this year.  Full discussion of the findings of the recent CT and options for evaluation were discussed with patient, family Kim Hodges(Gerry- son, and Kim LeashDonna- daughter in Social workerlaw) , and her nurse, Roseann.  3. Unspecified essential hypertension Mild elevation of SBP.  4. Anemia of chronic disease Continue to monitor periodically  5. Hypothyroidism compensated  6. Weak Associated with weight loss  7. Abnormal chest x-ray See discussion above  8. Post herpetic neuralgia - gabaentin 100 MG capsule; One 3 times daiily to help neuralgia  Dispense: 90 capsule; Refill: 5 - fentaNYL (DURAGESIC) 25 MCG/HR patch; Apply fresh patch every 3 days to help pain. Remove old patch.  Dispense: 5 patch; Refill: 0

## 2014-06-01 MED ORDER — FENTANYL 25 MCG/HR TD PT72: MEDICATED_PATCH | TRANSDERMAL | Status: DC

## 2014-06-01 MED ORDER — GABAPENTIN 100 MG PO CAPS: ORAL_CAPSULE | ORAL | Status: DC

## 2014-06-03 ENCOUNTER — Non-Acute Institutional Stay: Payer: Medicare PPO | Admitting: Nurse Practitioner

## 2014-06-03 ENCOUNTER — Encounter: Payer: Self-pay | Admitting: Nurse Practitioner

## 2014-06-03 VITALS — BP 154/64 | HR 68 | Temp 97.6°F | Ht 59.0 in | Wt 115.0 lb

## 2014-06-03 DIAGNOSIS — R911 Solitary pulmonary nodule: Secondary | ICD-10-CM

## 2014-06-03 DIAGNOSIS — R609 Edema, unspecified: Secondary | ICD-10-CM

## 2014-06-03 DIAGNOSIS — E039 Hypothyroidism, unspecified: Secondary | ICD-10-CM

## 2014-06-03 DIAGNOSIS — R531 Weakness: Secondary | ICD-10-CM

## 2014-06-03 DIAGNOSIS — R5381 Other malaise: Secondary | ICD-10-CM

## 2014-06-03 DIAGNOSIS — I10 Essential (primary) hypertension: Secondary | ICD-10-CM

## 2014-06-03 DIAGNOSIS — B0229 Other postherpetic nervous system involvement: Secondary | ICD-10-CM

## 2014-06-03 DIAGNOSIS — K219 Gastro-esophageal reflux disease without esophagitis: Secondary | ICD-10-CM

## 2014-06-03 DIAGNOSIS — R5383 Other fatigue: Secondary | ICD-10-CM

## 2014-06-03 HISTORY — DX: Gastro-esophageal reflux disease without esophagitis: K21.9

## 2014-06-03 NOTE — Assessment & Plan Note (Signed)
Controlled w/o meds 

## 2014-06-03 NOTE — Assessment & Plan Note (Signed)
Stable, continue Omeprazole 20mg daily.  

## 2014-06-03 NOTE — Assessment & Plan Note (Addendum)
Pain is controlled. Fentanyl 3112mcg/hr and Gabapentin 100mg  tid since 05/27/14. Continue Tylenol 650mg  tid.

## 2014-06-03 NOTE — Assessment & Plan Note (Signed)
Takes Levothyroxine 75mcg daily. 03/23/14 TSH 2.553. 05/20/14 TSH 3.224   

## 2014-06-10 NOTE — Assessment & Plan Note (Signed)
Left mid lung per CXR 08/02/13. CT evaluation recommended. [age] yo 04/2914 CXR let mid upper lung mass density-not new. C/o lef scapular area pain. Mild cardiomegaly w/o evident CHF 05/24/14 CT chest w/o contrast: enlarging masslike density in the area of prior linear scarring on 2009 chest CT. Findings are concerning for possible scar carcinoma. This could be futher evaluated PET CT or tissue biopsy if felt clinically indicated. Trace left pleural effusion. Mild cardiomegaly. Small hiatal hernia. Borderline low right paratracheal lymph node Upper back pain is much relieved since Fentanyl patch.

## 2014-06-10 NOTE — Assessment & Plan Note (Signed)
Trace edema BLE  

## 2014-06-10 NOTE — Progress Notes (Signed)
Patient ID: Kim Hodges, female   DOB: 11/03/1908, 80 y.o.   MRN: [id number]   Code Status: DNR  No Known Allergies  Chief Complaint  Patient presents with  . Medical Management of Chronic Issues    Comprehensive Exam: blood pressure, thyroid, hyperglycemia, CKD. With son Kim HansenGerald    HPI: Patient is a [age] y.o. female [facility] for evaluation of chronic medical conditions.  Problem List Items Addressed This Visit   Edema     Trace edema BLE    GERD (gastroesophageal reflux disease)     Stable, continue Omeprazole 20mg  daily.     Hypothyroidism (Chronic)     Takes Levothyroxine 75mcg daily. 03/23/14 TSH 2.553. 05/20/14 TSH 3.224     Nodule of left lung (Chronic)     Left mid lung per CXR 08/02/13. CT evaluation recommended. [age] yo 04/2914 CXR let mid upper lung mass density-not new. C/o lef scapular area pain. Mild cardiomegaly w/o evident CHF 05/24/14 CT chest w/o contrast: enlarging masslike density in the area of prior linear scarring on 2009 chest CT. Findings are concerning for possible scar carcinoma. This could be futher evaluated PET CT or tissue biopsy if felt clinically indicated. Trace left pleural effusion. Mild cardiomegaly. Small hiatal hernia. Borderline low right paratracheal lymph node Upper back pain is much relieved since Fentanyl patch.      Post herpetic neuralgia (Chronic)     Pain is controlled. Fentanyl 6512mcg/hr and Gabapentin 100mg  tid since 05/27/14. Continue Tylenol 650mg  tid.    Relevant Medications      gabapentin (NEURONTIN) 100 MG capsule   Unspecified essential hypertension (Chronic)     Controlled w/o meds.      Weak - Primary (Chronic)      Review of Systems: Review of Systems  Constitutional: Positive for malaise/fatigue. Negative for fever, chills, weight loss and diaphoresis.  HENT: Positive for hearing loss. Negative for congestion, ear discharge, ear pain, nosebleeds, sore throat and tinnitus.    Mild HOH  Eyes: Negative for blurred vision, double vision, pain, discharge and redness.       Glaucoma R+L  Respiratory: Negative for cough, hemoptysis, sputum production, shortness of breath, wheezing and stridor.   Cardiovascular: Negative for chest pain, palpitations, orthopnea, claudication, leg swelling and PND.  Gastrointestinal: Negative for heartburn, nausea, vomiting, abdominal pain, diarrhea, constipation, blood in stool and melena.  Genitourinary: Positive for frequency. Negative for dysuria, urgency, hematuria and flank pain.  Musculoskeletal: Positive for falls and joint pain. Negative for back pain, myalgias and neck pain.       Ambulates with walker. C/o upper left scapular area pain for a 5-6 days following a fall in her bathroom a few days earlier. Constant pain in moderate severity. Woke up a few times last night due to her discomfort. Pain is aching in nature and radiates acrose her upper thoracic spine to the  right upper scapula at the same level and to the left arm.   Skin: Negative for itching and rash.  Neurological: Negative for dizziness, tingling, tremors, sensory change, speech change, focal weakness, seizures, loss of consciousness, weakness and headaches.  Endo/Heme/Allergies: Negative for environmental allergies and polydipsia. Does not bruise/bleed easily.  Psychiatric/Behavioral: Positive for memory loss. Negative for depression, suicidal ideas, hallucinations and substance abuse. The patient is not nervous/anxious and does not have insomnia.      Past Medical History  Diagnosis Date  . Chronic kidney disease, stage II (mild) 12/25/2012  .  Unspecified arthropathy, lower leg 09/11/2011  . Swelling of limb 09/10/2012  . Other and unspecified hyperlipidemia 07/26/2011  . Actinic keratosis 07/26/2011  . Abnormality of gait 07/26/2011  . Hyperglycemia 07/26/2011  . Tear film insufficiency, unspecified 12/29/2009  . Other malaise and fatigue 12/29/2009  . Unspecified  hearing loss 06/30/2009  . Unspecified hypothyroidism 12/15/2008  . Unspecified essential hypertension 12/15/2008  . Unspecified glaucoma 12/15/2008    both eyes  . Weak 08/02/13  . Abnormality of gait 08/02/13  . Hyponatremia 08/02/13  . Herpes zoster 08/02/13  . Post herpetic neuralgia 08/02/13  . Cerebral atrophy 08/02/13  . Cerebrovascular disease 08/02/13  . Atherosclerosis 08/02/13  . Abnormal chest x-ray 08/02/13    08/02/13 nodule in left mid to upper lung  . Murmur, cardiac 08/02/13    aortic ejection  . Fracture lumbar vertebra-closed 08/02/13    L1  . Memory changes 07/03/13   Past Surgical History  Procedure Laterality Date  . Small intestine surgery  1957  . Cardiac catheterization  2004  . Gallbladder surgery  2006  . Cholecystectomy     Social History:   reports that she has never smoked. She has never used smokeless tobacco. She reports that she does not drink alcohol or use illicit drugs.  Family History  Problem Relation Age of Onset  . Cancer Sister   . Cancer Brother     pancreatic  . Cancer Son     lung  . Cancer Brother     pancreatic    Medications: Patient's Medications  New Prescriptions   No medications on file  Previous Medications   ACETAMINOPHEN (TYLENOL ARTHRITIS PAIN) 650 MG CR TABLET    Take 650 mg by mouth. Take one tablet three times daily, one tablet every 6 hours as needed for pain   ASPIRIN 81 MG TABLET    Take 81 mg by mouth daily.   CALCIUM CARBONATE-VITAMIN D (CALCIUM 600 + D PO)    Take 1 tablet by mouth daily.   DORZOLAMIDE-TIMOLOL (COSOPT) 22.3-6.8 MG/ML OPHTHALMIC SOLUTION    Place 1 drop into both eyes 2 (two) times daily.    FENTANYL (DURAGESIC) 25 MCG/HR PATCH    Apply fresh patch every 3 days to help pain. Remove old patch.   HYDROCODONE-ACETAMINOPHEN (NORCO/VICODIN) 5-325 MG PER TABLET    Take 0.5 tablets by mouth every 6 (six) hours as needed for moderate pain.   LEVOTHYROXINE (SYNTHROID, LEVOTHROID) 75 MCG TABLET     Take 75 mcg by mouth daily before breakfast.   OMEPRAZOLE (PRILOSEC) 20 MG CAPSULE    Take 20 mg by mouth daily.   SPECIALTY VITAMINS PRODUCTS (ICAPS LUTEIN-ZEAXANTHIN PO)    Take 1 tablet by mouth daily.  Modified Medications   Modified Medication Previous Medication   GABAPENTIN (NEURONTIN) 100 MG CAPSULE gabapentin (NEURONTIN) 100 MG capsule      100 mg. One tablet in morning and 2 at bedtime to help neuralgia    One 3 times daiily to help neuralgia  Discontinued Medications   No medications on file     Physical Exam: Physical Exam  Constitutional: She is oriented to person, place, and time. She appears well-developed and well-nourished. No distress.  HENT:  Head: Normocephalic and atraumatic.  Right Ear: External ear normal.  Left Ear: External ear normal.  Nose: Nose normal.  Mouth/Throat: Oropharynx is clear and moist. No oropharyngeal exudate.  Eyes: Conjunctivae and EOM are normal. Pupils are equal, round, and reactive to light. Right eye  exhibits no discharge. Left eye exhibits no discharge. No scleral icterus.  Neck: Normal range of motion. Neck supple. No JVD present. No tracheal deviation present. No thyromegaly present.  Cardiovascular: Normal rate and regular rhythm.   Murmur heard. 3/6  Pulmonary/Chest: Effort normal. No stridor. No respiratory distress. She has no wheezes. She has no rales. She exhibits no tenderness.  Abdominal: Soft. Bowel sounds are normal. She exhibits no distension. There is no tenderness. There is no rebound and no guarding.  Genitourinary: Guaiac negative stool.  Musculoskeletal: Normal range of motion. She exhibits no edema and no tenderness.  Ambulates with walker. C/o upper left scapular area pain for a 5-6 days following a fall in her bathroom a few days earlier. Constant pain in moderate severity. Woke up a few times last night due to her discomfort. Pain is aching in nature and radiates acrose her upper thoracic spine to the  right upper  scapula at the same level and to the left arm.  Lymphadenopathy:    She has no cervical adenopathy.  Neurological: She is alert and oriented to person, place, and time. She displays normal reflexes. No cranial nerve deficit. She exhibits normal muscle tone. Coordination normal.  Skin: Skin is warm and dry. No rash noted. She is not diaphoretic. No erythema. No pallor.  Psychiatric: She has a normal mood and affect. Her behavior is normal. Judgment and thought content normal.    Filed Vitals:   06/03/14 1555  BP: 154/64  Pulse: 68  Temp: 97.6 F (36.4 C)  TempSrc: Oral  Height: 4\' 11"  (1.499 m)  Weight: 115 lb (52.164 kg)      Labs reviewed: Basic Metabolic Panel:  Recent Labs  40/98/11 1630 08/02/13 1644 08/03/13 0532  08/04/13 0520 09/22/13 03/23/14 05/20/14  NA 129* 129* 129*  --  129* 138  --  135*  K 3.4* 4.3 3.2*  --  4.0 3.9  --  4.1  CL 91* 94* 96  --  97  --   --   --   CO2 26  --  25  --  23  --   --   --   GLUCOSE 102* 103* 112*  --  118*  --   --   --   BUN 34* 32* 29*  --  30* 16  --  25*  CREATININE 0.97 1.20* 0.93  --  0.93 0.9  --  1.0  CALCIUM 9.3  --  8.5  --  8.9  --   --   --   TSH  --   --   --   < >  --   --  2.55 3.22  < > = values in this interval not displayed. Liver Function Tests:  Recent Labs  08/02/13 1630 09/22/13 05/20/14  AST 27 14 18   ALT 17 8 8   ALKPHOS 54 55 67  BILITOT 0.5  --   --   PROT 7.2  --   --   ALBUMIN 3.7  --   --    CBC:  Recent Labs  08/02/13 1630  08/03/13 0532 08/04/13 0520 09/22/13 05/20/14  WBC 10.0  --  7.2 8.1 7.0 8.2  NEUTROABS 6.7  --   --   --   --   --   HGB 12.4  < > 11.2* 10.9* 10.8* 11.9*  HCT 36.3  < > 32.7* 32.1* 32* 34*  MCV 87.5  --  87.9 89.2  --   --  PLT 306  --  243 256 250 281  < > = values in this interval not displayed.  Past Procedures:  09/29/13 Echocardiogram: moderate AI, moderate to severe MR, moderate TR, moderate pulmonary hypertension, mild LLH, hyperdynamic LV systolic  function, EF 75%.    05/24/14 CT chest w/o contrast:  IMPRESSION:  Enlarging masslike density in the area of prior linear scarring on  2009 chest CT. Findings are concerning for possible scar carcinoma.  This could be further evaluated with PET CT or tissue biopsy if felt  clinically indicated.  Trace left pleural effusion.  Mild cardiomegaly.  Small hiatal hernia.  Borderline low right paratracheal lymph node.      Assessment/Plan Post herpetic neuralgia Pain is controlled. Fentanyl 56mcg/hr and Gabapentin 100mg  tid since 05/27/14. Continue Tylenol 650mg  tid.  Hypothyroidism Takes Levothyroxine daily. 03/23/14 TSH 2.553. 05/20/14 TSH 3.224   Unspecified essential hypertension Controlled w/o meds.    GERD (gastroesophageal reflux disease) Stable, continue Omeprazole 20mg  daily.   Edema Trace edema BLE  Nodule of left lung Left mid lung per CXR 08/02/13. CT evaluation recommended. 80 yo 04/2914 CXR let mid upper lung mass density-not new. C/o lef scapular area pain. Mild cardiomegaly w/o evident CHF 05/24/14 CT chest w/o contrast: enlarging masslike density in the area of prior linear scarring on 2009 chest CT. Findings are concerning for possible scar carcinoma. This could be futher evaluated PET CT or tissue biopsy if felt clinically indicated. Trace left pleural effusion. Mild cardiomegaly. Small hiatal hernia. Borderline low right paratracheal lymph node Upper back pain is much relieved since Fentanyl patch.      Family/ Staff Communication: observe the patient.   Goals of Care: AL  Labs/tests ordered: none

## 2014-06-30 ENCOUNTER — Inpatient Hospital Stay (HOSPITAL_COMMUNITY)
Admission: EM | Admit: 2014-06-30 | Discharge: 2014-07-06 | DRG: 480 | Disposition: A | Discharge status: Skilled Nursing Facility | Payer: Medicare PPO | Attending: Internal Medicine | Admitting: Internal Medicine

## 2014-06-30 ENCOUNTER — Encounter (HOSPITAL_COMMUNITY): Payer: Self-pay | Admitting: Anesthesiology

## 2014-06-30 ENCOUNTER — Inpatient Hospital Stay (HOSPITAL_COMMUNITY): Payer: Medicare PPO

## 2014-06-30 ENCOUNTER — Encounter (HOSPITAL_COMMUNITY)
Admission: EM | Disposition: A | Discharge status: Skilled Nursing Facility | Payer: Self-pay | Source: Home / Self Care | Attending: Internal Medicine

## 2014-06-30 ENCOUNTER — Encounter (HOSPITAL_COMMUNITY): Payer: Self-pay | Admitting: Emergency Medicine

## 2014-06-30 ENCOUNTER — Emergency Department (HOSPITAL_COMMUNITY): Payer: Medicare PPO

## 2014-06-30 DIAGNOSIS — IMO0002 Reserved for concepts with insufficient information to code with codable children: Secondary | ICD-10-CM

## 2014-06-30 DIAGNOSIS — E785 Hyperlipidemia, unspecified: Secondary | ICD-10-CM | POA: Diagnosis present

## 2014-06-30 DIAGNOSIS — S12600A Unspecified displaced fracture of seventh cervical vertebra, initial encounter for closed fracture: Secondary | ICD-10-CM

## 2014-06-30 DIAGNOSIS — R222 Localized swelling, mass and lump, trunk: Secondary | ICD-10-CM | POA: Diagnosis present

## 2014-06-30 DIAGNOSIS — H409 Unspecified glaucoma: Secondary | ICD-10-CM | POA: Diagnosis present

## 2014-06-30 DIAGNOSIS — H9190 Unspecified hearing loss, unspecified ear: Secondary | ICD-10-CM | POA: Diagnosis present

## 2014-06-30 DIAGNOSIS — I2789 Other specified pulmonary heart diseases: Secondary | ICD-10-CM | POA: Diagnosis present

## 2014-06-30 DIAGNOSIS — E039 Hypothyroidism, unspecified: Secondary | ICD-10-CM | POA: Diagnosis present

## 2014-06-30 DIAGNOSIS — I129 Hypertensive chronic kidney disease with stage 1 through stage 4 chronic kidney disease, or unspecified chronic kidney disease: Secondary | ICD-10-CM | POA: Diagnosis present

## 2014-06-30 DIAGNOSIS — Z9181 History of falling: Secondary | ICD-10-CM

## 2014-06-30 DIAGNOSIS — Y9210 Unspecified residential institution as the place of occurrence of the external cause: Secondary | ICD-10-CM | POA: Diagnosis present

## 2014-06-30 DIAGNOSIS — S72143A Displaced intertrochanteric fracture of unspecified femur, initial encounter for closed fracture: Principal | ICD-10-CM | POA: Diagnosis present

## 2014-06-30 DIAGNOSIS — I359 Nonrheumatic aortic valve disorder, unspecified: Secondary | ICD-10-CM | POA: Diagnosis present

## 2014-06-30 DIAGNOSIS — N182 Chronic kidney disease, stage 2 (mild): Secondary | ICD-10-CM | POA: Diagnosis present

## 2014-06-30 DIAGNOSIS — S22010A Wedge compression fracture of first thoracic vertebra, initial encounter for closed fracture: Secondary | ICD-10-CM

## 2014-06-30 DIAGNOSIS — K219 Gastro-esophageal reflux disease without esophagitis: Secondary | ICD-10-CM

## 2014-06-30 DIAGNOSIS — W010XXA Fall on same level from slipping, tripping and stumbling without subsequent striking against object, initial encounter: Secondary | ICD-10-CM | POA: Diagnosis present

## 2014-06-30 DIAGNOSIS — D62 Acute posthemorrhagic anemia: Secondary | ICD-10-CM | POA: Diagnosis not present

## 2014-06-30 DIAGNOSIS — R269 Unspecified abnormalities of gait and mobility: Secondary | ICD-10-CM | POA: Diagnosis present

## 2014-06-30 DIAGNOSIS — B0229 Other postherpetic nervous system involvement: Secondary | ICD-10-CM | POA: Diagnosis present

## 2014-06-30 DIAGNOSIS — S22019A Unspecified fracture of first thoracic vertebra, initial encounter for closed fracture: Secondary | ICD-10-CM

## 2014-06-30 DIAGNOSIS — D638 Anemia in other chronic diseases classified elsewhere: Secondary | ICD-10-CM

## 2014-06-30 DIAGNOSIS — S72002A Fracture of unspecified part of neck of left femur, initial encounter for closed fracture: Secondary | ICD-10-CM

## 2014-06-30 DIAGNOSIS — Z66 Do not resuscitate: Secondary | ICD-10-CM | POA: Diagnosis present

## 2014-06-30 DIAGNOSIS — J690 Pneumonitis due to inhalation of food and vomit: Secondary | ICD-10-CM | POA: Diagnosis not present

## 2014-06-30 DIAGNOSIS — R911 Solitary pulmonary nodule: Secondary | ICD-10-CM | POA: Diagnosis present

## 2014-06-30 DIAGNOSIS — R918 Other nonspecific abnormal finding of lung field: Secondary | ICD-10-CM

## 2014-06-30 DIAGNOSIS — I7090 Unspecified atherosclerosis: Secondary | ICD-10-CM | POA: Diagnosis present

## 2014-06-30 DIAGNOSIS — S22009A Unspecified fracture of unspecified thoracic vertebra, initial encounter for closed fracture: Secondary | ICD-10-CM | POA: Diagnosis present

## 2014-06-30 DIAGNOSIS — D72829 Elevated white blood cell count, unspecified: Secondary | ICD-10-CM | POA: Diagnosis not present

## 2014-06-30 DIAGNOSIS — S72142A Displaced intertrochanteric fracture of left femur, initial encounter for closed fracture: Secondary | ICD-10-CM

## 2014-06-30 DIAGNOSIS — I272 Pulmonary hypertension, unspecified: Secondary | ICD-10-CM

## 2014-06-30 DIAGNOSIS — M79609 Pain in unspecified limb: Secondary | ICD-10-CM | POA: Diagnosis present

## 2014-06-30 DIAGNOSIS — I10 Essential (primary) hypertension: Secondary | ICD-10-CM

## 2014-06-30 DIAGNOSIS — J189 Pneumonia, unspecified organism: Secondary | ICD-10-CM

## 2014-06-30 DIAGNOSIS — R011 Cardiac murmur, unspecified: Secondary | ICD-10-CM

## 2014-06-30 HISTORY — DX: Displaced intertrochanteric fracture of left femur, initial encounter for closed fracture: S72.142A

## 2014-06-30 HISTORY — DX: Unspecified displaced fracture of seventh cervical vertebra, initial encounter for closed fracture: S12.600A

## 2014-06-30 HISTORY — DX: Unspecified fracture of first thoracic vertebra, initial encounter for closed fracture: S22.019A

## 2014-06-30 HISTORY — DX: Other nonspecific abnormal finding of lung field: R91.8

## 2014-06-30 HISTORY — DX: History of falling: Z91.81

## 2014-06-30 LAB — BASIC METABOLIC PANEL
ANION GAP: 10 (ref 5–15)
BUN: 27 mg/dL — ABNORMAL HIGH (ref 6–23)
CHLORIDE: 95 meq/L — AB (ref 96–112)
CO2: 26 meq/L (ref 19–32)
Calcium: 9.2 mg/dL (ref 8.4–10.5)
Creatinine, Ser: 1.02 mg/dL (ref 0.50–1.10)
GFR calc Af Amer: 49 mL/min — ABNORMAL LOW (ref >90)
GFR calc non Af Amer: 43 mL/min — ABNORMAL LOW (ref >90)
GLUCOSE: 118 mg/dL — AB (ref 70–99)
Potassium: 4.7 mEq/L (ref 3.7–5.3)
SODIUM: 131 meq/L — AB (ref 137–147)

## 2014-06-30 LAB — CBC WITH DIFFERENTIAL/PLATELET
BASOS ABS: 0 10*3/uL (ref 0.0–0.1)
Basophils Relative: 0 % (ref 0–1)
Eosinophils Absolute: 0.2 10*3/uL (ref 0.0–0.7)
Eosinophils Relative: 3 % (ref 0–5)
HCT: 32.7 % — ABNORMAL LOW (ref 36.0–46.0)
Hemoglobin: 10.6 g/dL — ABNORMAL LOW (ref 12.0–15.0)
LYMPHS ABS: 1.4 10*3/uL (ref 0.7–4.0)
LYMPHS PCT: 17 % (ref 12–46)
MCH: 29.5 pg (ref 26.0–34.0)
MCHC: 32.4 g/dL (ref 30.0–36.0)
MCV: 91.1 fL (ref 78.0–100.0)
Monocytes Absolute: 0.9 10*3/uL (ref 0.1–1.0)
Monocytes Relative: 11 % (ref 3–12)
NEUTROS ABS: 5.8 10*3/uL (ref 1.7–7.7)
Neutrophils Relative %: 69 % (ref 43–77)
Platelets: 241 10*3/uL (ref 150–400)
RBC: 3.59 MIL/uL — AB (ref 3.87–5.11)
RDW: 14.6 % (ref 11.5–15.5)
WBC: 8.4 10*3/uL (ref 4.0–10.5)

## 2014-06-30 LAB — URINALYSIS, ROUTINE W REFLEX MICROSCOPIC
Bilirubin Urine: NEGATIVE
Glucose, UA: NEGATIVE mg/dL
Hgb urine dipstick: NEGATIVE
KETONES UR: NEGATIVE mg/dL
Leukocytes, UA: NEGATIVE
NITRITE: NEGATIVE
Protein, ur: NEGATIVE mg/dL
Specific Gravity, Urine: 1.011 (ref 1.005–1.030)
UROBILINOGEN UA: 0.2 mg/dL (ref 0.0–1.0)
pH: 7.5 (ref 5.0–8.0)

## 2014-06-30 LAB — TYPE AND SCREEN
ABO/RH(D): O POS
Antibody Screen: NEGATIVE

## 2014-06-30 LAB — SAMPLE TO BLOOD BANK

## 2014-06-30 LAB — ABO/RH: ABO/RH(D): O POS

## 2014-06-30 LAB — TROPONIN I

## 2014-06-30 SURGERY — FIXATION, FRACTURE, INTERTROCHANTERIC, WITH INTRAMEDULLARY ROD
Anesthesia: General | Laterality: Left

## 2014-06-30 MED ORDER — MORPHINE SULFATE 2 MG/ML IJ SOLN
0.5000 mg | INTRAMUSCULAR | Status: DC | PRN
  Administered 2014-07-02: 0.5 mg via INTRAVENOUS
  Filled 2014-06-30: qty 1

## 2014-06-30 MED ORDER — CEFAZOLIN SODIUM 1-5 GM-% IV SOLN
1.0000 g | Freq: Once | INTRAVENOUS | Status: AC
  Administered 2014-06-30: 1 g via INTRAVENOUS
  Filled 2014-06-30: qty 50

## 2014-06-30 MED ORDER — GABAPENTIN 100 MG PO CAPS
100.0000 mg | ORAL_CAPSULE | Freq: Three times a day (TID) | ORAL | Status: DC
  Administered 2014-07-01 – 2014-07-06 (×15): 100 mg via ORAL
  Filled 2014-06-30 (×21): qty 1

## 2014-06-30 MED ORDER — INFLUENZA VAC SPLIT QUAD 0.5 ML IM SUSY
0.5000 mL | PREFILLED_SYRINGE | INTRAMUSCULAR | Status: DC
  Filled 2014-06-30 (×2): qty 0.5

## 2014-06-30 MED ORDER — PROPOFOL 10 MG/ML IV BOLUS
INTRAVENOUS | Status: AC
Start: 2014-06-30 — End: 2014-06-30
  Filled 2014-06-30: qty 20

## 2014-06-30 MED ORDER — SENNA 8.6 MG PO TABS
1.0000 | ORAL_TABLET | Freq: Two times a day (BID) | ORAL | Status: DC
  Administered 2014-07-01 – 2014-07-05 (×5): 8.6 mg via ORAL
  Filled 2014-06-30 (×2): qty 1

## 2014-06-30 MED ORDER — HEPARIN SODIUM (PORCINE) 5000 UNIT/ML IJ SOLN
5000.0000 [IU] | Freq: Three times a day (TID) | INTRAMUSCULAR | Status: DC
  Administered 2014-07-01 (×2): 5000 [IU] via SUBCUTANEOUS
  Filled 2014-06-30 (×8): qty 1

## 2014-06-30 MED ORDER — FERROUS SULFATE 325 (65 FE) MG PO TABS
325.0000 mg | ORAL_TABLET | Freq: Every day | ORAL | Status: DC
  Administered 2014-07-01 – 2014-07-06 (×5): 325 mg via ORAL
  Filled 2014-06-30 (×9): qty 1

## 2014-06-30 MED ORDER — PANTOPRAZOLE SODIUM 40 MG PO TBEC
40.0000 mg | DELAYED_RELEASE_TABLET | Freq: Every day | ORAL | Status: DC
  Administered 2014-07-01 – 2014-07-06 (×5): 40 mg via ORAL
  Filled 2014-06-30 (×6): qty 1

## 2014-06-30 MED ORDER — FENTANYL 12 MCG/HR TD PT72
12.5000 ug | MEDICATED_PATCH | TRANSDERMAL | Status: DC
  Administered 2014-07-03: 12.5 ug via TRANSDERMAL
  Filled 2014-06-30: qty 1

## 2014-06-30 MED ORDER — LEVOTHYROXINE SODIUM 75 MCG PO TABS
75.0000 ug | ORAL_TABLET | Freq: Every day | ORAL | Status: DC
  Administered 2014-07-01 – 2014-07-06 (×6): 75 ug via ORAL
  Filled 2014-06-30 (×9): qty 1

## 2014-06-30 MED ORDER — ASPIRIN 81 MG PO CHEW
81.0000 mg | CHEWABLE_TABLET | Freq: Every day | ORAL | Status: DC
  Administered 2014-07-01: 81 mg via ORAL
  Filled 2014-06-30 (×2): qty 1

## 2014-06-30 MED ORDER — POLYETHYLENE GLYCOL 3350 17 G PO PACK: 17.0000 g | PACK | Freq: Every day | ORAL | Status: DC | PRN

## 2014-06-30 MED ORDER — SODIUM CHLORIDE 0.9 % IV SOLN
INTRAVENOUS | Status: DC
  Administered 2014-06-30 – 2014-07-02 (×4): via INTRAVENOUS

## 2014-06-30 MED ORDER — FENTANYL CITRATE 0.05 MG/ML IJ SOLN
INTRAMUSCULAR | Status: AC
  Filled 2014-06-30: qty 2

## 2014-06-30 MED ORDER — HYDROCODONE-ACETAMINOPHEN 5-325 MG PO TABS
1.0000 | ORAL_TABLET | Freq: Four times a day (QID) | ORAL | Status: DC | PRN
  Administered 2014-07-02: 2 via ORAL
  Administered 2014-07-03: 1 via ORAL
  Filled 2014-06-30: qty 2
  Filled 2014-06-30: qty 1

## 2014-06-30 MED ORDER — MORPHINE SULFATE 2 MG/ML IJ SOLN
2.0000 mg | INTRAMUSCULAR | Status: DC | PRN
  Administered 2014-06-30: 2 mg via INTRAVENOUS
  Filled 2014-06-30: qty 1

## 2014-06-30 NOTE — Anesthesia Preprocedure Evaluation (Deleted)

## 2014-06-30 NOTE — H&P (Addendum)
[facility] History and Physical  Patient: Kim Hodges  ZOX:[id number]  DOB: 1909-02-27  DOS: the patient was seen and examined on 06/30/2014 PCP: [name], MD  Chief Complaint: fall  HPI: Kim Hodges is a 80 y.o. female with Past medical history of postherpetic neurology. The pt presented with mechanical fall. She mentions she has been walking with a walker and lost her balance and fell backwards. She denies hitting her head of neck and fell on her hip. She denies felling dizzy or lightheaded prior to fall or any fever, chills, headache, cough, chest pain, palpitation, shortness of breath, nausea, vomiting, abdominal pain, diarrhea, constipation, active bleeding, burning urination, dizziness, pedal edema,  focal neurological deficit.  There is a h/o fall prior to this in July at which time she has some neck pain and shoulder pain. She continues to have the neck pain at present but the shoulder pain has resolved.  The patient is coming from ALF. And at her baseline independent for most of her ADL.  Review of Systems: as mentioned in the history of present illness.  A Comprehensive review of the other systems is negative.  Past Medical History  Diagnosis Date  . Chronic kidney disease, stage II (mild) 12/25/2012  . Unspecified arthropathy, lower leg 09/11/2011  . Swelling of limb 09/10/2012  . Other and unspecified hyperlipidemia 07/26/2011  . Actinic keratosis 07/26/2011  . Abnormality of gait 07/26/2011  . Hyperglycemia 07/26/2011  . Tear film insufficiency, unspecified 12/29/2009  . Other malaise and fatigue 12/29/2009  . Unspecified hearing loss 06/30/2009  . Unspecified hypothyroidism 12/15/2008  . Unspecified essential hypertension 12/15/2008  . Unspecified glaucoma 12/15/2008    both eyes  . Weak 08/02/13  . Abnormality of gait 08/02/13  . Hyponatremia 08/02/13  . Herpes zoster 08/02/13  . Post herpetic neuralgia 08/02/13  . Cerebral atrophy 08/02/13  .  Cerebrovascular disease 08/02/13  . Atherosclerosis 08/02/13  . Abnormal chest x-ray 08/02/13    08/02/13 nodule in left mid to upper lung  . Murmur, cardiac 08/02/13    aortic ejection  . Fracture lumbar vertebra-closed 08/02/13    L1  . Memory changes 07/03/13   Past Surgical History  Procedure Laterality Date  . Small intestine surgery  1957  . Cardiac catheterization  2004  . Gallbladder surgery  2006  . Cholecystectomy     Social History:  reports that she has never smoked. She has never used smokeless tobacco. She reports that she does not drink alcohol or use illicit drugs.  No Known Allergies  Family History  Problem Relation Age of Onset  . Cancer Sister   . Cancer Brother     pancreatic  . Cancer Son     lung  . Cancer Brother     pancreatic    Prior to Admission medications   Medication Sig Start Date End Date Taking? Authorizing Provider  acetaminophen (TYLENOL ARTHRITIS PAIN) 650 MG CR tablet Take 650 mg by mouth 3 (three) times daily as needed for pain. May also take one tablet every 6 hours as needed for pain   Yes Historical Provider, MD  aspirin 81 MG chewable tablet Chew 81 mg by mouth daily.   Yes Historical Provider, MD  Calcium Carb-Cholecalciferol (CALCIUM 500 +D) 500-400 MG-UNIT TABS Take 1 tablet by mouth daily.   Yes Historical Provider, MD  dorzolamide-timolol (COSOPT) 22.3-6.8 MG/ML ophthalmic solution Place 1 drop into both eyes 2 (two) times daily.  05/31/13  Yes Historical Provider,  MD  fentaNYL (DURAGESIC - DOSED MCG/HR) 12 MCG/HR Place 12.5 mcg onto the skin every 3 (three) days.   Yes Historical Provider, MD  gabapentin (NEURONTIN) 100 MG capsule Take 100 mg by mouth 3 (three) times daily.  06/01/14  Yes [name], MD  HYDROcodone-acetaminophen (NORCO/VICODIN) 5-325 MG per tablet Take 0.5 tablets by mouth every 6 (six) hours as needed for moderate pain.   Yes Historical Provider, MD  levothyroxine (SYNTHROID, LEVOTHROID) 75 MCG tablet Take  75 mcg by mouth daily before breakfast.   Yes Historical Provider, MD  omeprazole (PRILOSEC) 20 MG capsule Take 20 mg by mouth daily.   Yes Historical Provider, MD  Specialty Vitamins Products (ICAPS LUTEIN-ZEAXANTHIN PO) Take 1 tablet by mouth daily.   Yes Historical Provider, MD    Physical Exam: Filed Vitals:   06/30/14 1730 06/30/14 1808 06/30/14 1830 06/30/14 2016  BP: 199/65 201/72 178/81 162/62  Pulse: 65 81 65 74  Temp:      TempSrc:      Resp:  SpO2: 98% 97% 99% 99%    General: Alert, Awake and Oriented to Time, Place and Person. Appear in mild distress Eyes: PERRL ENT: Oral Mucosa clear moist. Neck: no JVD Cardiovascular: S1 and S2 Present, no Murmur, Peripheral Pulses Present Respiratory: Bilateral Air entry equal and Decreased, Clear to Auscultation, noCrackles, no wheezes Abdomen: Bowel Sound Present, Soft and Non tender Skin: no Rash Extremities: no Pedal edema, no calf tenderness Left leg rotated and shorted Neurologic: Grossly no focal neuro deficit.  Labs on Admission:  CBC:  Recent Labs Lab 06/30/14 1807  WBC 8.4  NEUTROABS 5.8  HGB 10.6*  HCT 32.7*  MCV 91.1  PLT 241    CMP     Component Value Date/Time   NA 131* 06/30/2014 1807   NA 135* 05/20/2014   K 4.7 06/30/2014 1807   CL 95* 06/30/2014 1807   CO2 26 06/30/2014 1807   GLUCOSE 118* 06/30/2014 1807   BUN 27* 06/30/2014 1807   BUN 25* 05/20/2014   CREATININE 1.02 06/30/2014 1807   CREATININE 1.0 05/20/2014   CALCIUM 9.2 06/30/2014 1807   PROT 7.2 08/02/2013 1630   ALBUMIN 3.7 08/02/2013 1630   AST 18 05/20/2014   ALT 8 05/20/2014   ALKPHOS 67 05/20/2014   BILITOT 0.5 08/02/2013 1630   GFRNONAA 43* 06/30/2014 1807   GFRAA 49* 06/30/2014 1807    No results found for this basename: LIPASE, AMYLASE,  in the last 168 hours No results found for this basename: AMMONIA,  in the last 168 hours   Recent Labs Lab 06/30/14 1807  TROPONINI <0.30   BNP (last 3 results) No results found for this  basename: PROBNP,  in the last 8760 hours  Radiological Exams on Admission: Dg Chest 1 View  06/30/2014   CLINICAL DATA:  Pain post trauma  EXAM: CHEST - 1 VIEW  COMPARISON:  Chest CT May 24, 2014  FINDINGS: Mass in the left upper lobe is again noted measuring approximately 2.3 x 1.8 cm. Elsewhere, lungs are clear. Heart is upper normal in size with pulmonary vascularity within normal limits. No adenopathy. No pneumothorax. No fractures apparent.  IMPRESSION: Right upper lobe mass again noted, suspicious for neoplasm. Elsewhere lungs clear. No pneumothorax apparent.   Electronically Signed   By: Bretta Bang M.D.   On: 06/30/2014 19:02   Dg Hip Complete Left  06/30/2014   CLINICAL DATA:  Fall.  Left leg deformity and  pain.  EXAM: LEFT HIP - COMPLETE 2+ VIEW  COMPARISON:  08/02/2013.  FINDINGS: Frontal pelvis with AP and frog-leg lateral views of the left hip show a left intertrochanteric proximal femur fracture with varus angulation. No evidence for pubic ramus fracture. SI joints and symphysis pubis are unremarkable.  IMPRESSION: Intertrochanteric left femur fracture with varus angulation.   Electronically Signed   By: Kennith Center M.D.   On: 06/30/2014 19:03   Dg Ankle Complete Right  06/30/2014   CLINICAL DATA:  Bruising post trauma  EXAM: RIGHT ANKLE - COMPLETE 3+ VIEW  COMPARISON:  None.  FINDINGS: Frontal, oblique, and lateral views were obtained. There is a small avulsion arising from the lateral malleolus. There is a second small avulsion arising from the dorsal distal talus. There is no appreciable joint effusion. There is mild generalized soft tissue swelling. The ankle mortise appears intact. There is a spur arising from the inferior calcaneus.  IMPRESSION: Small lateral malleolar avulsion. There is an avulsion also arising from the dorsal distal talus. Ankle mortise appears intact.   Electronically Signed   By: Bretta Bang M.D.   On: 06/30/2014 19:04   Dg Foot Complete  Right  06/30/2014   CLINICAL DATA:  Status post fall.  Bruising  EXAM: RIGHT FOOT COMPLETE - 3+ VIEW  COMPARISON:  None.  FINDINGS: The bones are osteopenic. There is mild degenerative changes at the first MTP joint. There is no evidence of arthropathy or other focal bone abnormality. Small plantar heel spur noted. Soft tissues are unremarkable. There is mild diffuse soft tissue swelling noted.  IMPRESSION: 1. Soft tissue swelling. 2. Mild chronic degenerative change.   Electronically Signed   By: Signa Kell M.D.   On: 06/30/2014 19:05    EKG: Independently reviewed. normal sinus rhythm, nonspecific ST and T waves changes. Assessment/Plan Active Problems:   Closed left hip fracture   1. Preoperative evaluation for intertrochanteric hip fracture of left side, closed. 1) Cardiac risk: Based on RCRI She does not have any high risk criteria,  With this the patient is a moderate risk for adverse Cardiac outcome from surgery due to her advance age. Recommend further work up with CT cervical spine prior to surgery due to fall and neck pain since it has not been imaged during her last work up.. the risks and benefits were discussed and acceptable to patient and family. Continue hold aspirin preop, can resume post op  2) Pulmonary risk: She has a pulmonary mass that is suspicious for malignancy but they have decided not to pursue further work up at present. She is not hypoxic or tachypnea and thus At present risk of surgery outweighs the benefit. Recommend optimization of lund function with use of inhalers and incentive spirometry. Good pulmunary toilet.  3) General risk: Avoid major fluctuation in blood pressure intra-op and post operatively. Minimal sedation and Narcotics due to advance age. Will request Surgeon to please Order Lovenox/DVT prophylaxis of his/her choice when OK from Surgeon's standpoint post op.  2.post herpetic neuralgia Continue home pain management with fentanyl patch and  gabapenti  3.hypothyroidism Continue synthroid   4.gerd Continue PPI  Consults: orthopedics  DVT Prophylaxis: subcutaneous Heparin Nutrition: npo  Code Status: DNR DNI  Family Communication: family was present at bedside, opportunity was given to ask question and all questions were answered satisfactorily at the time of interview. Disposition: Admitted to inpatient in med-surge unit.  Author: Lynden Oxford, MD [facility] Pager: [phone] 06/30/2014, 9:03 PM  If 7PM-7AM, please contact night-coverage [email or web address] Password [email or web address]  Addendum As per recommendation from anesthesia, we would like to review patient's echocardiogram due to prior echocardiogram showing abnormalities. She had a cardiac catheterization in 2012 with normal coronaries, and an echocardiogram in 2005 which did not show any significant abnormalities. At present I am unable to locate the echocardiogram discussed with anesthesiologist performed in 2012. We will obtain an echocardiogram in the morning and based on that decide further consultation. As per my discussion with orthopedics surgery will be postponed probably until Friday. We change the diet from n.p.o. to cardiac. Discussed with the family.  Buren Havey   **Disclaimer: This note may have been dictated with voice recognition software. Similar sounding words can inadvertently be transcribed and this note may contain transcription errors which may not have been corrected upon publication of note.**

## 2014-06-30 NOTE — Consult Note (Addendum)
Reason for Consult:Left intertrochanteric femur fracture Referring Physician: Dr. Audie Clear Kim Hodges is an 80 y.o. female.  HPI: [name] is a 80 yo female who had a mechanical fall at her residence ([facility]) this evening. She was reaching for her walker and fell backward onto her buttocks with immediate pain in her left hip and inability to get up. She did not have any prodromal symptoms leading to the fall. She did not hit her head or have a loss of consciousness. Her only complaint is left hip pain radiating to her left knee. She denies any numbness or tingling in her left leg/foot. 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  Past Medical History  Diagnosis Date  . Chronic kidney disease, stage II (mild) 12/25/2012  . Unspecified arthropathy, lower leg 09/11/2011  . Swelling of limb 09/10/2012  . Other and unspecified hyperlipidemia 07/26/2011  . Actinic keratosis 07/26/2011  . Abnormality of gait 07/26/2011  . Hyperglycemia 07/26/2011  . Tear film insufficiency, unspecified 12/29/2009  . Other malaise and fatigue 12/29/2009  . Unspecified hearing loss 06/30/2009  . Unspecified hypothyroidism 12/15/2008  . Unspecified essential hypertension 12/15/2008  . Unspecified glaucoma 12/15/2008    both eyes  . Weak 08/02/13  . Abnormality of gait 08/02/13  . Hyponatremia 08/02/13  . Herpes zoster 08/02/13  . Post herpetic neuralgia 08/02/13  . Cerebral atrophy 08/02/13  . Cerebrovascular disease 08/02/13  . Atherosclerosis 08/02/13  . Abnormal chest x-ray 08/02/13    08/02/13 nodule in left mid to upper lung  . Murmur, cardiac 08/02/13    aortic ejection  . Fracture lumbar vertebra-closed 08/02/13    L1  . Memory changes 07/03/13    Past Surgical History  Procedure Laterality Date  . Small intestine surgery  1957  . Cardiac catheterization  2004  . Gallbladder surgery  2006  . Cholecystectomy      Family History  Problem Relation Age of Onset  . Cancer  Sister   . Cancer Brother     pancreatic  . Cancer Son     lung  . Cancer Brother     pancreatic    Social History:  reports that she has never smoked. She has never used smokeless tobacco. She reports that she does not drink alcohol or use illicit drugs.  Allergies: No Known Allergies  Medications: I have reviewed the patient's current medications.  Results for orders placed during the hospital encounter of 06/30/14 (from the past 48 hour(s))  CBC WITH DIFFERENTIAL     Status: Abnormal   Collection Time    06/30/14  6:07 PM      Result Value Ref Range   WBC 8.4  4.0 - 10.5 K/uL   RBC 3.59 (*) 3.87 - 5.11 MIL/uL   Hemoglobin 10.6 (*) 12.0 - 15.0 g/dL   HCT 32.7 (*) 36.0 - 46.0 %   MCV 91.1  78.0 - 100.0 fL   MCH 29.5  26.0 - 34.0 pg   MCHC 32.4  30.0 - 36.0 g/dL   RDW 14.6  11.5 - 15.5 %   Platelets 241  150 - 400 K/uL   Neutrophils Relative % 69  43 - 77 %   Neutro Abs 5.8  1.7 - 7.7 K/uL   Lymphocytes Relative 17  12 - 46 %   Lymphs Abs 1.4  0.7 - 4.0 K/uL   Monocytes Relative 11  3 - 12 %   Monocytes Absolute 0.9  0.1 - 1.0 K/uL   Eosinophils Relative 3  0 - 5 %   Eosinophils Absolute 0.2  0.0 - 0.7 K/uL   Basophils Relative 0  0 - 1 %   Basophils Absolute 0.0  0.0 - 0.1 K/uL  BASIC METABOLIC PANEL     Status: Abnormal   Collection Time    06/30/14  6:07 PM      Result Value Ref Range   Sodium 131 (*) 137 - 147 mEq/L   Potassium 4.7  3.7 - 5.3 mEq/L   Comment: SLIGHT HEMOLYSIS     HEMOLYSIS AT THIS LEVEL MAY AFFECT RESULT   Chloride 95 (*) 96 - 112 mEq/L   CO2 26  19 - 32 mEq/L   Glucose, Bld 118 (*) 70 - 99 mg/dL   BUN 27 (*) 6 - 23 mg/dL   Creatinine, Ser 1.02  0.50 - 1.10 mg/dL   Calcium 9.2  8.4 - 10.5 mg/dL   GFR calc non Af Amer 43 (*) >90 mL/min   GFR calc Af Amer 49 (*) >90 mL/min   Comment: (NOTE)     The eGFR has been calculated using the CKD EPI equation.     This calculation has not been validated in all clinical situations.     eGFR's  persistently <90 mL/min signify possible Chronic Kidney     Disease.   Anion gap 10  5 - 15  TROPONIN I     Status: None   Collection Time    06/30/14  6:07 PM      Result Value Ref Range   Troponin I <0.30  <0.30 ng/mL   Comment:            Due to the release kinetics of cTnI,     a negative result within the first hours     of the onset of symptoms does not rule out     myocardial infarction with certainty.     If myocardial infarction is still suspected,     repeat the test at appropriate intervals.  SAMPLE TO BLOOD BANK     Status: None   Collection Time    06/30/14  6:08 PM      Result Value Ref Range   Blood Bank Specimen SAMPLE AVAILABLE FOR TESTING     Sample Expiration 07/03/2014      Dg Chest 1 View  06/30/2014   CLINICAL DATA:  Pain post trauma  EXAM: CHEST - 1 VIEW  COMPARISON:  Chest CT May 24, 2014  FINDINGS: Mass in the left upper lobe is again noted measuring approximately 2.3 x 1.8 cm. Elsewhere, lungs are clear. Heart is upper normal in size with pulmonary vascularity within normal limits. No adenopathy. No pneumothorax. No fractures apparent.  IMPRESSION: Right upper lobe mass again noted, suspicious for neoplasm. Elsewhere lungs clear. No pneumothorax apparent.   Electronically Signed   By: Lowella Grip M.D.   On: 06/30/2014 19:02   Dg Hip Complete Left  06/30/2014   CLINICAL DATA:  Fall.  Left leg deformity and pain.  EXAM: LEFT HIP - COMPLETE 2+ VIEW  COMPARISON:  08/02/2013.  FINDINGS: Frontal pelvis with AP and frog-leg lateral views of the left hip show a left intertrochanteric proximal femur fracture with varus angulation. No evidence for pubic ramus fracture. SI joints and symphysis pubis are unremarkable.  IMPRESSION: Intertrochanteric left femur fracture with varus angulation.   Electronically Signed   By: Misty Stanley M.D.   On: 06/30/2014  19:03   Dg Ankle Complete Right  06/30/2014   CLINICAL DATA:  Bruising post trauma  EXAM: RIGHT ANKLE - COMPLETE  3+ VIEW  COMPARISON:  None.  FINDINGS: Frontal, oblique, and lateral views were obtained. There is a small avulsion arising from the lateral malleolus. There is a second small avulsion arising from the dorsal distal talus. There is no appreciable joint effusion. There is mild generalized soft tissue swelling. The ankle mortise appears intact. There is a spur arising from the inferior calcaneus.  IMPRESSION: Small lateral malleolar avulsion. There is an avulsion also arising from the dorsal distal talus. Ankle mortise appears intact.   Electronically Signed   By: Lowella Grip M.D.   On: 06/30/2014 19:04   Dg Foot Complete Right  06/30/2014   CLINICAL DATA:  Status post fall.  Bruising  EXAM: RIGHT FOOT COMPLETE - 3+ VIEW  COMPARISON:  None.  FINDINGS: The bones are osteopenic. There is mild degenerative changes at the first MTP joint. There is no evidence of arthropathy or other focal bone abnormality. Small plantar heel spur noted. Soft tissues are unremarkable. There is mild diffuse soft tissue swelling noted.  IMPRESSION: 1. Soft tissue swelling. 2. Mild chronic degenerative change.   Electronically Signed   By: Kerby Moors M.D.   On: 06/30/2014 19:05    ROS Blood pressure 162/62, pulse 74, temperature 98.2 F (36.8 C), temperature source Oral, resp. rate 14, SpO2 99.00%. Physical Exam Physical Examination: General appearance - alert, well appearing, and in no distress Mental status - alert, oriented to person, place, and time Chest - clear to auscultation, no wheezes, rales or rhonchi, symmetric air entry Heart - normal rate, regular rhythm, normal S1, S2, no murmurs, rubs, clicks or gallops Abdomen - soft, nontender, nondistended, no masses or organomegaly Neurological - alert, oriented, normal speech, no focal findings or movement disorder noted LLE shortened and externally rotated. Sensation intact; trace palpable pulses; can wiggle toes and foot   Assessment/Plan:Left  intertrochanteric femur fracture- Plan intramedullary nailing of this fracture. Discussed in detail including procedure, risks and potential complications and patient and family elect to proceed. I have told the patient that her medical risks will be higher than usual at age 38, but with her remarkable health and mental status, the risks will not be prohibitive. Plan on surgery tonight.    Gearlean Alf 06/30/2014, 8:27 PM     IN reviewing the chart, Dr. Glennon Mac discovered a reference to an echocardiogram from 12/14 which showed pulmonary hypertension and multiple valvular abnormalities. The family was unaware of any echo or any heart problems. If she truly has these abnormalities her risk of anesthesia will be much higher. Dr. Glennon Mac and I spoke with the family and given the uncertainty of her cardiac status and possible significant increased risk, they decided to hold off on surgery until we get a better grasp on her cardiac status with a repeat echo. I have notified Dr. Posey Pronto and he will get appropriate testing tomorrow. We will hold on surgery until her status is better defined.

## 2014-06-30 NOTE — ED Notes (Signed)
Bed: ZO10 Expected date:  Expected time:  Means of arrival:  Comments: EMS 89-y-o hip pain/fall

## 2014-06-30 NOTE — ED Notes (Signed)
Hospitalist at bedside 

## 2014-06-30 NOTE — ED Provider Notes (Signed)
CSN: [id number]     Arrival date & time 06/30/14  1700 History   First MD Initiated Contact with Patient 06/30/14 1718     Chief Complaint  Patient presents with  . Fall  . Hip Pain      HPI Pt was seen at 1735. Per EMS, NH report, pt's family and pt, c/o sudden onset and resolution of one episode of slip and fall that occurred at her NH PTA. Pt reached backwards for something in the bathroom, slipped and fell, landing on her buttocks and left hip. Pt was unable to weight bear on her left hip due to pain. Pt also c/o "bruise to my right foot" for the past several days. Pt cannot recall any injury. Pt ambulates with a walker at baseline. Denies fevers, no syncope/near syncope, no head injury/LOC, no AMS, no neck or back pain, no abd pain, no N/V/D, no CP/SOB, no focal motor weakness, no tinging/numbness in extremities.    Past Medical History  Diagnosis Date  . Chronic kidney disease, stage II (mild) 12/25/2012  . Unspecified arthropathy, lower leg 09/11/2011  . Swelling of limb 09/10/2012  . Other and unspecified hyperlipidemia 07/26/2011  . Actinic keratosis 07/26/2011  . Abnormality of gait 07/26/2011  . Hyperglycemia 07/26/2011  . Tear film insufficiency, unspecified 12/29/2009  . Other malaise and fatigue 12/29/2009  . Unspecified hearing loss 06/30/2009  . Unspecified hypothyroidism 12/15/2008  . Unspecified essential hypertension 12/15/2008  . Unspecified glaucoma 12/15/2008    both eyes  . Weak 08/02/13  . Abnormality of gait 08/02/13  . Hyponatremia 08/02/13  . Herpes zoster 08/02/13  . Post herpetic neuralgia 08/02/13  . Cerebral atrophy 08/02/13  . Cerebrovascular disease 08/02/13  . Atherosclerosis 08/02/13  . Abnormal chest x-ray 08/02/13    08/02/13 nodule in left mid to upper lung  . Murmur, cardiac 08/02/13    aortic ejection  . Fracture lumbar vertebra-closed 08/02/13    L1  . Memory changes 07/03/13   Past Surgical History  Procedure Laterality Date  . Small  intestine surgery  1957  . Cardiac catheterization  2004  . Gallbladder surgery  2006  . Cholecystectomy     Family History  Problem Relation Age of Onset  . Cancer Sister   . Cancer Brother     pancreatic  . Cancer Son     lung  . Cancer Brother     pancreatic   History  Substance Use Topics  . Smoking status: Never Smoker   . Smokeless tobacco: Never Used  . Alcohol Use: No    Review of Systems ROS: Statement: All systems negative except as marked or noted in the HPI; Constitutional: Negative for fever and chills. ; ; Eyes: Negative for eye pain, redness and discharge. ; ; ENMT: Negative for ear pain, hoarseness, nasal congestion, sinus pressure and sore throat. ; ; Cardiovascular: Negative for chest pain, palpitations, diaphoresis, dyspnea and peripheral edema. ; ; Respiratory: Negative for cough, wheezing and stridor. ; ; Gastrointestinal: Negative for nausea, vomiting, diarrhea, abdominal pain, blood in stool, hematemesis, jaundice and rectal bleeding. . ; ; Genitourinary: Negative for dysuria, flank pain and hematuria. ; ; Musculoskeletal: +left hip pain. Negative for back pain and neck pain. Negative for swelling.; ; Skin: +bruising right foot. Negative for pruritus, rash, abrasions, blisters, and skin lesion.; ; Neuro: Negative for headache, lightheadedness and neck stiffness. Negative for weakness, altered level of consciousness , altered mental status, extremity weakness, paresthesias, involuntary movement, seizure and syncope.  Allergies  Review of patient's allergies indicates no known allergies.  Home Medications   Prior to Admission medications   Medication Sig Start Date End Date Taking? Authorizing Provider  acetaminophen (TYLENOL ARTHRITIS PAIN) 650 MG CR tablet Take 650 mg by mouth 3 (three) times daily as needed for pain. May also take one tablet every 6 hours as needed for pain   Yes Historical Provider, MD  aspirin 81 MG chewable tablet Chew 81 mg by mouth  daily.   Yes Historical Provider, MD  Calcium Carb-Cholecalciferol (CALCIUM 500 +D) 500-400 MG-UNIT TABS Take 1 tablet by mouth daily.   Yes Historical Provider, MD  dorzolamide-timolol (COSOPT) 22.3-6.8 MG/ML ophthalmic solution Place 1 drop into both eyes 2 (two) times daily.  05/31/13  Yes Historical Provider, MD  fentaNYL (DURAGESIC - DOSED MCG/HR) 12 MCG/HR Place 12.5 mcg onto the skin every 3 (three) days.   Yes Historical Provider, MD  gabapentin (NEURONTIN) 100 MG capsule Take 100 mg by mouth 3 (three) times daily.  06/01/14  Yes [name], MD  HYDROcodone-acetaminophen (NORCO/VICODIN) 5-325 MG per tablet Take 0.5 tablets by mouth every 6 (six) hours as needed for moderate pain.   Yes Historical Provider, MD  levothyroxine (SYNTHROID, LEVOTHROID) 75 MCG tablet Take 75 mcg by mouth daily before breakfast.   Yes Historical Provider, MD  omeprazole (PRILOSEC) 20 MG capsule Take 20 mg by mouth daily.   Yes Historical Provider, MD  Specialty Vitamins Products (ICAPS LUTEIN-ZEAXANTHIN PO) Take 1 tablet by mouth daily.   Yes Historical Provider, MD   BP 201/72  Pulse 81  Temp(Src) 98.2 F (36.8 C) (Oral)  Resp 14  SpO2 97% Physical Exam 1740: Physical examination:  Nursing notes reviewed; Vital signs and O2 SAT reviewed;  Constitutional: Well developed, Well nourished, Well hydrated, In no acute distress; Head:  Normocephalic, atraumatic; Eyes: EOMI, PERRL, No scleral icterus; ENMT: Mouth and pharynx normal, Mucous membranes moist; Neck: Supple, Full range of motion, No lymphadenopathy; Cardiovascular: Regular rate and rhythm, No gallop; Respiratory: Breath sounds clear & equal bilaterally, No rales, rhonchi, wheezes.  Speaking full sentences with ease, Normal respiratory effort/excursion; Chest: Nontender, Movement normal; Abdomen: Soft, Nontender, Nondistended, Normal bowel sounds; Genitourinary: No CVA tenderness; Spine:  No midline CS, TS, LS tenderness.;; Extremities: Pulses normal, Pelvis  stable. +TTP left hip with deformity, no ecchymosis, no erythema, no open wounds. NT left knee/ankle/foot. NMS intact distal LLE. +fading ecchymosis to right dorsal and lateral foot, NT to palp, no open wounds, no erythema. +tr pedal edema bilat without calf asymmetry.; Neuro: AA&Ox3, +HOH, otherwise major CN grossly intact.  Speech clear. +left hip decreased ROM, otherwise no gross focal motor deficits in extremities.; Skin: Color normal, Warm, Dry.   ED Course  Procedures     EKG Interpretation   Date/Time:  Wednesday June 30 2014 18:05:21 EDT Ventricular Rate:  72 PR Interval:  160 QRS Duration: 83 QT Interval:  394 QTC Calculation: 431 R Axis:   37 Text Interpretation:  Sinus rhythm Baseline wander When compared with ECG  of 08/02/2013 No significant change was found Confirmed by [facility]  MD,  Nicholos Johns [phone]) on 06/30/2014 6:41:31 PM      MDM  MDM Reviewed: previous chart, nursing note and vitals Reviewed previous: labs and ECG Interpretation: labs, ECG and x-ray   Results for orders placed during the hospital encounter of 06/30/14  CBC WITH DIFFERENTIAL      Result Value Ref Range   WBC 8.4  4.0 - 10.5 K/uL  RBC 3.59 (*) 3.87 - 5.11 MIL/uL   Hemoglobin 10.6 (*) 12.0 - 15.0 g/dL   HCT 16.1 (*) 09.6 - 04.5 %   MCV 91.1  78.0 - 100.0 fL   MCH 29.5  26.0 - 34.0 pg   MCHC 32.4  30.0 - 36.0 g/dL   RDW 40.9  81.1 - 91.4 %   Platelets 241  150 - 400 K/uL   Neutrophils Relative % 69  43 - 77 %   Neutro Abs 5.8  1.7 - 7.7 K/uL   Lymphocytes Relative 17  12 - 46 %   Lymphs Abs 1.4  0.7 - 4.0 K/uL   Monocytes Relative 11  3 - 12 %   Monocytes Absolute 0.9  0.1 - 1.0 K/uL   Eosinophils Relative 3  0 - 5 %   Eosinophils Absolute 0.2  0.0 - 0.7 K/uL   Basophils Relative 0  0 - 1 %   Basophils Absolute 0.0  0.0 - 0.1 K/uL  BASIC METABOLIC PANEL      Result Value Ref Range   Sodium 131 (*) 137 - 147 mEq/L   Potassium 4.7  3.7 - 5.3 mEq/L   Chloride 95 (*) 96 - 112  mEq/L   CO2 26  19 - 32 mEq/L   Glucose, Bld 118 (*) 70 - 99 mg/dL   BUN 27 (*) 6 - 23 mg/dL   Creatinine, Ser 7.82  0.50 - 1.10 mg/dL   Calcium 9.2  8.4 - 95.6 mg/dL   GFR calc non Af Amer 43 (*) >90 mL/min   GFR calc Af Amer 49 (*) >90 mL/min   Anion gap 10  5 - 15  TROPONIN I      Result Value Ref Range   Troponin I <0.30  <0.30 ng/mL  SAMPLE TO BLOOD BANK      Result Value Ref Range   Blood Bank Specimen SAMPLE AVAILABLE FOR TESTING     Sample Expiration 07/03/2014     Dg Chest 1 View 06/30/2014   CLINICAL DATA:  Pain post trauma  EXAM: CHEST - 1 VIEW  COMPARISON:  Chest CT May 24, 2014  FINDINGS: Mass in the left upper lobe is again noted measuring approximately 2.3 x 1.8 cm. Elsewhere, lungs are clear. Heart is upper normal in size with pulmonary vascularity within normal limits. No adenopathy. No pneumothorax. No fractures apparent.  IMPRESSION: Right upper lobe mass again noted, suspicious for neoplasm. Elsewhere lungs clear. No pneumothorax apparent.   Electronically Signed   By: Bretta Bang M.D.   On: 06/30/2014 19:02   Dg Hip Complete Left 06/30/2014   CLINICAL DATA:  Fall.  Left leg deformity and pain.  EXAM: LEFT HIP - COMPLETE 2+ VIEW  COMPARISON:  08/02/2013.  FINDINGS: Frontal pelvis with AP and frog-leg lateral views of the left hip show a left intertrochanteric proximal femur fracture with varus angulation. No evidence for pubic ramus fracture. SI joints and symphysis pubis are unremarkable.  IMPRESSION: Intertrochanteric left femur fracture with varus angulation.   Electronically Signed   By: Kennith Center M.D.   On: 06/30/2014 19:03   Dg Ankle Complete Right 06/30/2014   CLINICAL DATA:  Bruising post trauma  EXAM: RIGHT ANKLE - COMPLETE 3+ VIEW  COMPARISON:  None.  FINDINGS: Frontal, oblique, and lateral views were obtained. There is a small avulsion arising from the lateral malleolus. There is a second small avulsion arising from the dorsal distal talus. There is no  appreciable joint  effusion. There is mild generalized soft tissue swelling. The ankle mortise appears intact. There is a spur arising from the inferior calcaneus.  IMPRESSION: Small lateral malleolar avulsion. There is an avulsion also arising from the dorsal distal talus. Ankle mortise appears intact.   Electronically Signed   By: Bretta Bang M.D.   On: 06/30/2014 19:04   Dg Foot Complete Right 06/30/2014   CLINICAL DATA:  Status post fall.  Bruising  EXAM: RIGHT FOOT COMPLETE - 3+ VIEW  COMPARISON:  None.  FINDINGS: The bones are osteopenic. There is mild degenerative changes at the first MTP joint. There is no evidence of arthropathy or other focal bone abnormality. Small plantar heel spur noted. Soft tissues are unremarkable. There is mild diffuse soft tissue swelling noted.  IMPRESSION: 1. Soft tissue swelling. 2. Mild chronic degenerative change.   Electronically Signed   By: Signa Kell M.D.   On: 06/30/2014 19:05    1930:  Will keep pt NPO. Will remove fentanyl patch and dose IV morphine for pain. Dx and testing d/w pt and family.  Questions answered.  Verb understanding, agreeable to admit. T/C to Ortho Dr. Lequita Halt, case discussed, including:  HPI, pertinent PM/SHx, VS/PE, dx testing, ED course and treatment:  Agreeable to consult, requests to admit to medicine service. T/C to [facility] Dr. Allena Katz, case discussed, including:  HPI, pertinent PM/SHx, VS/PE, dx testing, ED course and treatment:  Agreeable to admit, requests to write temporary orders, obtain medical bed to team 8.     Samuel Jester, DO 07/02/14 1456

## 2014-06-30 NOTE — ED Notes (Signed)
Fentanyl patch removed.

## 2014-06-30 NOTE — ED Notes (Signed)
Per EMS- patient is a resident of [facility] at [facility]. Patient had slick socks on, reached backwards for something in the bathroom and slipped, landing on her left hip. Patient has obvious deformity, shortening and rotation noted to the left hip. Patient refused pain medicine en route to the ED. Patient has a Fentanyl patch on presently.

## 2014-07-01 ENCOUNTER — Encounter (HOSPITAL_COMMUNITY): Payer: Self-pay | Admitting: *Deleted

## 2014-07-01 DIAGNOSIS — I319 Disease of pericardium, unspecified: Secondary | ICD-10-CM

## 2014-07-01 DIAGNOSIS — R011 Cardiac murmur, unspecified: Secondary | ICD-10-CM

## 2014-07-01 DIAGNOSIS — S72009A Fracture of unspecified part of neck of unspecified femur, initial encounter for closed fracture: Secondary | ICD-10-CM

## 2014-07-01 DIAGNOSIS — S12600A Unspecified displaced fracture of seventh cervical vertebra, initial encounter for closed fracture: Secondary | ICD-10-CM

## 2014-07-01 DIAGNOSIS — I10 Essential (primary) hypertension: Secondary | ICD-10-CM

## 2014-07-01 DIAGNOSIS — R918 Other nonspecific abnormal finding of lung field: Secondary | ICD-10-CM

## 2014-07-01 DIAGNOSIS — R222 Localized swelling, mass and lump, trunk: Secondary | ICD-10-CM

## 2014-07-01 HISTORY — DX: Other nonspecific abnormal finding of lung field: R91.8

## 2014-07-01 LAB — CBC WITH DIFFERENTIAL/PLATELET
BASOS ABS: 0 10*3/uL (ref 0.0–0.1)
Basophils Relative: 0 % (ref 0–1)
EOS ABS: 0.1 10*3/uL (ref 0.0–0.7)
EOS PCT: 1 % (ref 0–5)
HEMATOCRIT: 29.3 % — AB (ref 36.0–46.0)
Hemoglobin: 9.8 g/dL — ABNORMAL LOW (ref 12.0–15.0)
Lymphocytes Relative: 17 % (ref 12–46)
Lymphs Abs: 1.6 10*3/uL (ref 0.7–4.0)
MCH: 30 pg (ref 26.0–34.0)
MCHC: 33.4 g/dL (ref 30.0–36.0)
MCV: 89.6 fL (ref 78.0–100.0)
MONO ABS: 1 10*3/uL (ref 0.1–1.0)
Monocytes Relative: 10 % (ref 3–12)
Neutro Abs: 6.9 10*3/uL (ref 1.7–7.7)
Neutrophils Relative %: 72 % (ref 43–77)
Platelets: 203 10*3/uL (ref 150–400)
RBC: 3.27 MIL/uL — ABNORMAL LOW (ref 3.87–5.11)
RDW: 14.3 % (ref 11.5–15.5)
WBC: 9.7 10*3/uL (ref 4.0–10.5)

## 2014-07-01 LAB — COMPREHENSIVE METABOLIC PANEL
ALT: 7 U/L (ref 0–35)
AST: 17 U/L (ref 0–37)
Albumin: 3.2 g/dL — ABNORMAL LOW (ref 3.5–5.2)
Alkaline Phosphatase: 63 U/L (ref 39–117)
Anion gap: 12 (ref 5–15)
BUN: 23 mg/dL (ref 6–23)
CALCIUM: 8.8 mg/dL (ref 8.4–10.5)
CO2: 24 mEq/L (ref 19–32)
Chloride: 96 mEq/L (ref 96–112)
Creatinine, Ser: 0.91 mg/dL (ref 0.50–1.10)
GFR calc Af Amer: 57 mL/min — ABNORMAL LOW (ref >90)
GFR calc non Af Amer: 49 mL/min — ABNORMAL LOW (ref >90)
Glucose, Bld: 112 mg/dL — ABNORMAL HIGH (ref 70–99)
Potassium: 4.3 mEq/L (ref 3.7–5.3)
Sodium: 132 mEq/L — ABNORMAL LOW (ref 137–147)
Total Bilirubin: 0.5 mg/dL (ref 0.3–1.2)
Total Protein: 6.5 g/dL (ref 6.0–8.3)

## 2014-07-01 LAB — PROTIME-INR
INR: 1.18 (ref 0.00–1.49)
PROTHROMBIN TIME: 15 s (ref 11.6–15.2)

## 2014-07-01 LAB — SURGICAL PCR SCREEN
MRSA, PCR: NEGATIVE
Staphylococcus aureus: NEGATIVE

## 2014-07-01 MED ORDER — ENSURE COMPLETE PO LIQD
237.0000 mL | Freq: Two times a day (BID) | ORAL | Status: DC
  Administered 2014-07-01 – 2014-07-06 (×8): 237 mL via ORAL

## 2014-07-01 MED ORDER — AMLODIPINE BESYLATE 2.5 MG PO TABS
2.5000 mg | ORAL_TABLET | Freq: Every day | ORAL | Status: DC
  Administered 2014-07-01 – 2014-07-03 (×3): 2.5 mg via ORAL
  Filled 2014-07-01 (×5): qty 1

## 2014-07-01 MED ORDER — CARVEDILOL 3.125 MG PO TABS
3.1250 mg | ORAL_TABLET | Freq: Two times a day (BID) | ORAL | Status: DC
  Administered 2014-07-01 – 2014-07-06 (×11): 3.125 mg via ORAL
  Filled 2014-07-01 (×16): qty 1

## 2014-07-01 NOTE — Progress Notes (Signed)
  Echocardiogram 2D Echocardiogram has been performed.  Kim Hodges M 07/01/2014, 8:53 AM

## 2014-07-01 NOTE — Progress Notes (Signed)
OT Cancellation Note  Patient Details Name: Kim Hodges MRN: [id number] DOB: 06-25-1909   Cancelled Treatment:    Reason Eval/Treat Not Completed: Other (comment).  Noted sx likely tomorrow.  Please reorder after sx.  Thank you.  Taytum Wheller 07/01/2014, 4:03 PM Marica Otter, OTR/L [phone] 07/01/2014

## 2014-07-01 NOTE — Progress Notes (Signed)
Subjective: Hospital day - 1 Patient reports pain as mild.   Patient seen in rounds for Dr. Lequita Halt.  Family in room at bedside.  Just completed the ECHO and results are pending at this time. Patient is well, and has had no acute complaints or problems Plan is a resident at [facility] and will likely need to go to the skilled unit there following the hospital stay. Patient and family want to proceed with surgery at this time but understand that workup needs to be completed. Unlikely that the surgery will occur today so will allow diet today and the make NPO starting tomorrow.  Objective: Vital signs in last 24 hours: Temp:  [98.2 F (36.8 C)-99.9 F (37.7 C)] 99.9 F (37.7 C) (09/10 0500) Pulse Rate:  [65-89] 82 (09/10 0500) Resp:  [12-29] 20 (09/10 0500) BP: (162-215)/(47-81) 170/47 mmHg (09/10 0500) SpO2:  [95 %-99 %] 97 % (09/10 0500) Weight:  [51.4 kg (113 lb 5.1 oz)] 51.4 kg (113 lb 5.1 oz) (09/09 2230)  Intake/Output from previous day:  Intake/Output Summary (Last 24 hours) at 07/01/14 0922 Last data filed at 07/01/14 0600  Gross per 24 hour  Intake 562.92 ml  Output    525 ml  Net  37.92 ml    Labs:  Recent Labs  06/30/14 1807 07/01/14 0632  HGB 10.6* 9.8*    Recent Labs  06/30/14 1807 07/01/14 0632  WBC 8.4 9.7  RBC 3.59* 3.27*  HCT 32.7* 29.3*  PLT 241 203    Recent Labs  06/30/14 1807 07/01/14 0632  NA 131* 132*  K 4.7 4.3  CL 95* 96  CO2 26 24  BUN 27* 23  CREATININE 1.02 0.91  GLUCOSE 118* 112*  CALCIUM 9.2 8.8    Recent Labs  07/01/14 0632  INR 1.18    EXAM General - Patient is Alert and Appropriate Extremity - Neurovascular intact Sensation intact distally Motor Function - intact, moving foot and toes well on exam.   Past Medical History  Diagnosis Date  . Chronic kidney disease, stage II (mild) 12/25/2012  . Unspecified arthropathy, lower leg 09/11/2011  . Swelling of limb 09/10/2012  . Other and unspecified  hyperlipidemia 07/26/2011  . Actinic keratosis 07/26/2011  . Abnormality of gait 07/26/2011  . Hyperglycemia 07/26/2011  . Tear film insufficiency, unspecified 12/29/2009  . Other malaise and fatigue 12/29/2009  . Unspecified hearing loss 06/30/2009  . Unspecified hypothyroidism 12/15/2008  . Unspecified essential hypertension 12/15/2008  . Unspecified glaucoma 12/15/2008    both eyes  . Weak 08/02/13  . Abnormality of gait 08/02/13  . Hyponatremia 08/02/13  . Herpes zoster 08/02/13  . Post herpetic neuralgia 08/02/13  . Cerebral atrophy 08/02/13  . Cerebrovascular disease 08/02/13  . Atherosclerosis 08/02/13  . Abnormal chest x-ray 08/02/13    08/02/13 nodule in left mid to upper lung  . Murmur, cardiac 08/02/13    aortic ejection  . Fracture lumbar vertebra-closed 08/02/13    L1  . Memory changes 07/03/13    Assessment/Plan: Hospital day - 1 Principal Problem:   Closed left hip fracture Active Problems:   Hypothyroidism   Unspecified essential hypertension   Nodule of left lung   Pulmonary hypertension   GERD (gastroesophageal reflux disease)  Estimated body mass index is 21.42 kg/(m^2) as calculated from the following:   Height as of this encounter:  (1.549 m).   Weight as of this encounter: 51.4 kg (113 lb 5.1 oz). Order diet Bedrest for now Possible  plan for surgery tomorrow depending upon workup.  Avel Peace, PA-C Orthopaedic Surgery 07/01/2014, 9:22 AM

## 2014-07-01 NOTE — Progress Notes (Signed)
OT Cancellation Note  Patient Details Name: Kim Hodges MRN: [id number] DOB: 11-22-1908   Cancelled Treatment:    Reason Eval/Treat Not Completed: Other (comment).  Pt has not had sx.  Will see after surgery.  Shelley Pooley 07/01/2014, 7:45 AM Marica Otter, OTR/L [phone] 07/01/2014

## 2014-07-01 NOTE — Plan of Care (Signed)
Problem: Consults Goal: Hip/Femur Fracture Patient Education See Patient Education Module for education specifics.  Cardiology consulted, orthopedic consulted for C7 fracture, Hip fracture  Goal: Skin Care Protocol Initiated - if Braden Score 18 or less If consults are not indicated, leave blank or document N/A  Outcome: Completed/Met Date Met:  07/01/14 Floated heels   Problem: Phase I Progression Outcomes Goal: Pre op labs/procedures/consults per MD order Outcome: Completed/Met Date Met:  07/01/14 2d echo done 07/01/14 at bedside

## 2014-07-01 NOTE — Progress Notes (Signed)
PT Cancellation Note  Patient Details Name: Kim Hodges MRN: [id number] DOB: 13-Nov-1908   Cancelled Treatment:     PT deferred.  Pt with surgery for L hip fx pending.  Please re-order post op as needed.     Ausar Georgiou 07/01/2014, 1:17 PM

## 2014-07-01 NOTE — Progress Notes (Signed)
[facility] PROGRESS NOTE  Kim Hodges ZOX:[id number] DOB: [birth year] DOA: 06/30/2014 PCP: [name], MD  Assessment/Plan: #1 left intertrochanteric femur fracture Secondary to mechanical fall. Patient has been seen by orthopedics and due to some concerns for possible abnormalities on prior echo repeat 2-D echo has been ordered. Patient denies any chest pain or shortness of breath. Patient denies any cardiac history. Patient had cardiac catheterization in 2012 with normal coronaries.patient had a 2-D echo in September of 2005 with EF of 55-65% with no wall motion abnormalities. Mild aortic valve thickness, mild to moderate mitral annular calcification consistent with mild mitral stenosis left atrium was mildly dilated. Repeat 2-D echo is pending. Orthopedics is followingand will decide on timing of surgery based on 2-D echo results and pending neurosurgical evaluation for T1 compression fracture and C7 lamina fracture. Per orthopedics.  #2 T1 compression fracture of C7 lamina fracture A CT of the C-spine.. Patient denies any numbness and tingling in the upper extremities and on the complain of some mild neck discomfort.  Patient is moving her neck. Due to probable surgical intervention needed at patient's left femur fracture orthopedics recommended neurosurgical evaluation for further input. Will place patient in a soft collar. Will consult with neurosurgery for further evaluation and management.  #3 history of lung mass Patient with a prior history of a pulmonary mass suspicious for malignancy however family decided not to pursue any further workup at this time. Patient is not hypoxic. Patient is not tachypneic.continue pulmonary toilet.  #4 postherpetic neuralgia Continue home pain regimen of fentanyl patch and gabapentin.  #5 hypothyroidism Continue home dose Synthroid.  #6 hypertension Will place on low-dose Coreg.  #7 gastroesophageal reflux disease PPI.  #8  prophylaxis Protonix for GI prophylaxis. Heparin for DVT prophylaxis.  Code Status: DO NOT RESUSCITATE Family Communication: updated patient and family at bedside. Disposition Plan: remain inpatient. Probable SNF when ready for discharge.   Consultants:  Orthopedics: Dr Lequita Halt 009/06/2014  Procedures:  CT C-spine 06/30/2014  X-ray of the left hip 06/30/2014  X-ray of the right foot 06/30/2014  Chest x-ray 06/30/2001  X-ray of the right ankle 06/30/2014  Antibiotics:  None  HPI/Subjective: Patient c/o soreness in left thigh. Patient denies any numbness or tingling in BUE. Mild neck discomfort.  Objective: Filed Vitals:   07/01/14 0800  BP:   Pulse:   Temp:   Resp: 18    Intake/Output Summary (Last 24 hours) at 07/01/14 1017 Last data filed at 07/01/14 0900  Gross per 24 hour  Intake 562.92 ml  Output    525 ml  Net  37.92 ml   Filed Weights   06/30/14 2230  Weight: 51.4 kg (113 lb 5.1 oz)    Exam:   General:  NAD  Cardiovascular: RRR  Respiratory: CTAB  Abdomen: Soft/NT/ND/+BS  Musculoskeletal: No c/c/e. LLE shortened and externally rotated.  Data Reviewed: Basic Metabolic Panel:  Recent Labs Lab 06/30/14 1807 07/01/14 0632  NA 131* 132*  K 4.7 4.3  CL 95* 96  CO2 26 24  GLUCOSE 118* 112*  BUN 27* 23  CREATININE 1.02 0.91  CALCIUM 9.2 8.8   Liver Function Tests:  Recent Labs Lab 07/01/14 0632  AST 17  ALT 7  ALKPHOS 63  BILITOT 0.5  PROT 6.5  ALBUMIN 3.2*   No results found for this basename: LIPASE, AMYLASE,  in the last 168 hours No results found for this basename: AMMONIA,  in the last 168 hours CBC:  Recent Labs Lab  06/30/14 1807 07/01/14 0632  WBC 8.4 9.7  NEUTROABS 5.8 6.9  HGB 10.6* 9.8*  HCT 32.7* 29.3*  MCV 91.1 89.6  PLT 241 203   Cardiac Enzymes:  Recent Labs Lab 06/30/14 1807  TROPONINI <0.30   BNP (last 3 results) No results found for this basename: PROBNP,  in the last 8760  hours CBG: No results found for this basename: GLUCAP,  in the last 168 hours  Recent Results (from the past 240 hour(s))  SURGICAL PCR SCREEN     Status: None   Collection Time    06/30/14 10:56 PM      Result Value Ref Range Status   MRSA, PCR NEGATIVE  NEGATIVE Final   Staphylococcus aureus NEGATIVE  NEGATIVE Final   Comment:            The Xpert SA Assay (FDA     approved for NASAL specimens     in patients over 32 years of age),     is one component of     a comprehensive surveillance     program.  Test performance has     been validated by [employer] for patients greater     than or equal to 33 year old.     It is not intended     to diagnose infection nor to     guide or monitor treatment.     Studies: Dg Chest 1 View  06/30/2014   CLINICAL DATA:  Pain post trauma  EXAM: CHEST - 1 VIEW  COMPARISON:  Chest CT May 24, 2014  FINDINGS: Mass in the left upper lobe is again noted measuring approximately 2.3 x 1.8 cm. Elsewhere, lungs are clear. Heart is upper normal in size with pulmonary vascularity within normal limits. No adenopathy. No pneumothorax. No fractures apparent.  IMPRESSION: Right upper lobe mass again noted, suspicious for neoplasm. Elsewhere lungs clear. No pneumothorax apparent.   Electronically Signed   By: Bretta Bang M.D.   On: 06/30/2014 19:02   Dg Hip Complete Left  06/30/2014   CLINICAL DATA:  Fall.  Left leg deformity and pain.  EXAM: LEFT HIP - COMPLETE 2+ VIEW  COMPARISON:  08/02/2013.  FINDINGS: Frontal pelvis with AP and frog-leg lateral views of the left hip show a left intertrochanteric proximal femur fracture with varus angulation. No evidence for pubic ramus fracture. SI joints and symphysis pubis are unremarkable.  IMPRESSION: Intertrochanteric left femur fracture with varus angulation.   Electronically Signed   By: Kennith Center M.D.   On: 06/30/2014 19:03   Dg Ankle Complete Right  06/30/2014   CLINICAL DATA:  Bruising post trauma  EXAM:  RIGHT ANKLE - COMPLETE 3+ VIEW  COMPARISON:  None.  FINDINGS: Frontal, oblique, and lateral views were obtained. There is a small avulsion arising from the lateral malleolus. There is a second small avulsion arising from the dorsal distal talus. There is no appreciable joint effusion. There is mild generalized soft tissue swelling. The ankle mortise appears intact. There is a spur arising from the inferior calcaneus.  IMPRESSION: Small lateral malleolar avulsion. There is an avulsion also arising from the dorsal distal talus. Ankle mortise appears intact.   Electronically Signed   By: Bretta Bang M.D.   On: 06/30/2014 19:04   Ct Cervical Spine Wo Contrast  06/30/2014   CLINICAL DATA:  Larey Seat.  Neck pain.  EXAM: CT CERVICAL SPINE WITHOUT CONTRAST  TECHNIQUE: Multidetector CT imaging of the cervical  spine was performed without intravenous contrast. Multiplanar CT image reconstructions were also generated.  COMPARISON:  None.  FINDINGS: Advanced degenerative cervical spondylosis with multilevel disc disease and facet disease. The overall alignment is maintained. The spinal canal is fairly generous. There is a compression type fracture of the T1 vertebral body. There is also a laminar fracture on the left at C7.  No other definite fractures are identified. The C1-2 articulations are maintained. The dens is intact.  IMPRESSION: T1 compression fracture and left C7 laminar fracture.  Advanced degenerative cervical spondylosis with multilevel disc disease and facet disease.   Electronically Signed   By: Loralie Champagne M.D.   On: 06/30/2014 21:44   Dg Foot Complete Right  06/30/2014   CLINICAL DATA:  Status post fall.  Bruising  EXAM: RIGHT FOOT COMPLETE - 3+ VIEW  COMPARISON:  None.  FINDINGS: The bones are osteopenic. There is mild degenerative changes at the first MTP joint. There is no evidence of arthropathy or other focal bone abnormality. Small plantar heel spur noted. Soft tissues are unremarkable. There is  mild diffuse soft tissue swelling noted.  IMPRESSION: 1. Soft tissue swelling. 2. Mild chronic degenerative change.   Electronically Signed   By: Signa Kell M.D.   On: 06/30/2014 19:05    Scheduled Meds: . aspirin  81 mg Oral Daily  . carvedilol  3.125 mg Oral BID WC  . fentaNYL  12.5 mcg Transdermal Q72H  . ferrous sulfate  325 mg Oral Q breakfast  . gabapentin  100 mg Oral TID  . heparin  5,000 Units Subcutaneous 3 times per day  . Influenza vac split quadrivalent PF  0.5 mL Intramuscular Tomorrow-1000  . levothyroxine  75 mcg Oral QAC breakfast  . pantoprazole  40 mg Oral Daily  . senna  1 tablet Oral BID   Continuous Infusions: . sodium chloride 50 mL/hr at 06/30/14 2346    Principal Problem:   Closed left hip fracture Active Problems:   Hypothyroidism   Unspecified essential hypertension   Nodule of left lung   Pulmonary hypertension   GERD (gastroesophageal reflux disease)   Traumatic compression fracture of T1 thoracic vertebra   C7 cervical fracture   Lung mass; LUL    Time spent: 35 mins    [facility] MD [facility] Pager [phone]. If 7PM-7AM, please contact night-coverage at [email or web address], password [facility] 07/01/2014, 10:17 AM  LOS: 1 day

## 2014-07-01 NOTE — Progress Notes (Signed)
INITIAL NUTRITION ASSESSMENT  DOCUMENTATION CODES Per approved criteria  -Not Applicable   INTERVENTION: - Ensure Complete BID - Encouraged increased meal intake - RD to continue to monitor   NUTRITION DIAGNOSIS: Inadequate oral intake related to chronic poor appetite as evidenced by pt report.   Goal: Pt to consume >90% of meals/supplements  Monitor:  Weights, labs, intake  Reason for Assessment: Malnutrition screening tool, low braden  80 y.o. female  Admitting Dx: Closed left hip fracture  ASSESSMENT: Pt with medical history of postherpetic neurology. The pt presented with mechanical fall. She mentions she has been walking with a walker and lost her balance and fell backwards. She denies hitting her head of neck and fell on her hip. Found to have closed left hip fracture.   - Met with pt, grand-daughter and her husband - They report pt "eats like a bird" on a regular/thin liquid diet - Not on any nutritional supplements PTA - They deny pt with any problems chewing or swallowing - Pt said she usually eats some eggs and bacon for breakfast and then vegetables for lunch and dinner - No significant changes in weight - Observed pt with mild fat loss in upper arms   Height: Ht Readings from Last 1 Encounters:  06/30/14 _0  (1.549 m)    Weight: Wt Readings from Last 1 Encounters:  06/30/14 113 lb 5.1 oz (51.4 kg)    Ideal Body Weight: 105 lbs  % Ideal Body Weight: 108%  Wt Readings from Last 10 Encounters:  06/30/14 113 lb 5.1 oz (51.4 kg)  06/30/14 113 lb 5.1 oz (51.4 kg)  06/03/14 115 lb (52.164 kg)  05/27/14 112 lb (50.803 kg)  03/18/14 118 lb (53.524 kg)  11/26/13 122 lb (55.339 kg)  08/06/13 119 lb (53.978 kg)  08/02/13 119 lb 14.4 oz (54.386 kg)  07/20/13 125 lb 9.6 oz (56.972 kg)  07/13/13 125 lb (56.7 kg)    Usual Body Weight: 113 lbs   % Usual Body Weight: 100%  BMI:  Body mass index is 21.42 kg/(m^2).  Estimated Nutritional Needs: Kcal:  1300-1500 Protein: 65-85g Fluid: 1.3-1.5L/day   Skin: LLE edema  Diet Order: General  EDUCATION NEEDS: -No education needs identified at this time   Intake/Output Summary (Last 24 hours) at 07/01/14 1149 Last data filed at 07/01/14 1113  Gross per 24 hour  Intake 562.92 ml  Output    975 ml  Net -412.08 ml    Last BM: 9/9   Labs:   Recent Labs Lab 06/30/14 1807 07/01/14 0632  NA 131* 132*  K 4.7 4.3  CL 95* 96  CO2 26 24  BUN 27* 23  CREATININE 1.02 0.91  CALCIUM 9.2 8.8  GLUCOSE 118* 112*    CBG (last 3)  No results found for this basename: GLUCAP,  in the last 72 hours  Scheduled Meds: . aspirin  81 mg Oral Daily  . carvedilol  3.125 mg Oral BID WC  . fentaNYL  12.5 mcg Transdermal Q72H  . ferrous sulfate  325 mg Oral Q breakfast  . gabapentin  100 mg Oral TID  . heparin  5,000 Units Subcutaneous 3 times per day  . Influenza vac split quadrivalent PF  0.5 mL Intramuscular Tomorrow-1000  . levothyroxine  75 mcg Oral QAC breakfast  . pantoprazole  40 mg Oral Daily  . senna  1 tablet Oral BID    Continuous Infusions: . sodium chloride 50 mL/hr at 06/30/14 2346    Past Medical  History  Diagnosis Date  . Chronic kidney disease, stage II (mild) 12/25/2012  . Unspecified arthropathy, lower leg 09/11/2011  . Swelling of limb 09/10/2012  . Other and unspecified hyperlipidemia 07/26/2011  . Actinic keratosis 07/26/2011  . Abnormality of gait 07/26/2011  . Hyperglycemia 07/26/2011  . Tear film insufficiency, unspecified 12/29/2009  . Other malaise and fatigue 12/29/2009  . Unspecified hearing loss 06/30/2009  . Unspecified hypothyroidism 12/15/2008  . Unspecified essential hypertension 12/15/2008  . Unspecified glaucoma 12/15/2008    both eyes  . Weak 08/02/13  . Abnormality of gait 08/02/13  . Hyponatremia 08/02/13  . Herpes zoster 08/02/13  . Post herpetic neuralgia 08/02/13  . Cerebral atrophy 08/02/13  . Cerebrovascular disease 08/02/13  .  Atherosclerosis 08/02/13  . Abnormal chest x-ray 08/02/13    08/02/13 nodule in left mid to upper lung  . Murmur, cardiac 08/02/13    aortic ejection  . Fracture lumbar vertebra-closed 08/02/13    L1  . Memory changes 07/03/13  . Lung mass 07/01/2014    Past Surgical History  Procedure Laterality Date  . Small intestine surgery  1957  . Cardiac catheterization  2004  . Gallbladder surgery  2006  . Cholecystectomy      Carlis Stable MS, RD, LDN [phone] Pager [phone] Weekend/After Hours Pager

## 2014-07-02 ENCOUNTER — Inpatient Hospital Stay (HOSPITAL_COMMUNITY): Payer: Medicare PPO

## 2014-07-02 ENCOUNTER — Inpatient Hospital Stay (HOSPITAL_COMMUNITY): Payer: Medicare PPO | Admitting: Anesthesiology

## 2014-07-02 ENCOUNTER — Encounter (HOSPITAL_COMMUNITY): Payer: Medicare PPO | Admitting: Anesthesiology

## 2014-07-02 ENCOUNTER — Encounter (HOSPITAL_COMMUNITY)
Admission: EM | Disposition: A | Discharge status: Skilled Nursing Facility | Payer: Self-pay | Source: Home / Self Care | Attending: Internal Medicine

## 2014-07-02 DIAGNOSIS — S72143A Displaced intertrochanteric fracture of unspecified femur, initial encounter for closed fracture: Principal | ICD-10-CM | POA: Diagnosis present

## 2014-07-02 HISTORY — PX: FEMUR IM NAIL: SHX1597

## 2014-07-02 LAB — BASIC METABOLIC PANEL
ANION GAP: 13 (ref 5–15)
BUN: 23 mg/dL (ref 6–23)
CO2: 24 mEq/L (ref 19–32)
CREATININE: 0.84 mg/dL (ref 0.50–1.10)
Calcium: 8.6 mg/dL (ref 8.4–10.5)
Chloride: 98 mEq/L (ref 96–112)
GFR calc non Af Amer: 54 mL/min — ABNORMAL LOW (ref >90)
GFR, EST AFRICAN AMERICAN: 63 mL/min — AB (ref >90)
Glucose, Bld: 102 mg/dL — ABNORMAL HIGH (ref 70–99)
POTASSIUM: 3.7 meq/L (ref 3.7–5.3)
Sodium: 135 mEq/L — ABNORMAL LOW (ref 137–147)

## 2014-07-02 LAB — CBC
HCT: 27.1 % — ABNORMAL LOW (ref 36.0–46.0)
Hemoglobin: 9.1 g/dL — ABNORMAL LOW (ref 12.0–15.0)
MCH: 30 pg (ref 26.0–34.0)
MCHC: 33.6 g/dL (ref 30.0–36.0)
MCV: 89.4 fL (ref 78.0–100.0)
PLATELETS: 203 10*3/uL (ref 150–400)
RBC: 3.03 MIL/uL — ABNORMAL LOW (ref 3.87–5.11)
RDW: 14.6 % (ref 11.5–15.5)
WBC: 10.3 10*3/uL (ref 4.0–10.5)

## 2014-07-02 LAB — IRON AND TIBC
IRON: 18 ug/dL — AB (ref 42–135)
Saturation Ratios: 8 % — ABNORMAL LOW (ref 20–55)
TIBC: 217 ug/dL — AB (ref 250–470)
UIBC: 199 ug/dL (ref 125–400)

## 2014-07-02 LAB — PROTIME-INR
INR: 1.2 (ref 0.00–1.49)
PROTHROMBIN TIME: 15.2 s (ref 11.6–15.2)

## 2014-07-02 LAB — VITAMIN B12: Vitamin B-12: 365 pg/mL (ref 211–911)

## 2014-07-02 LAB — FERRITIN: Ferritin: 150 ng/mL (ref 10–291)

## 2014-07-02 LAB — FOLATE: Folate: 11.8 ng/mL

## 2014-07-02 LAB — TSH: TSH: 1.99 u[IU]/mL (ref 0.350–4.500)

## 2014-07-02 SURGERY — INSERTION, INTRAMEDULLARY ROD, FEMUR
Anesthesia: Spinal | Site: Hip | Laterality: Left

## 2014-07-02 MED ORDER — ACETAMINOPHEN 325 MG PO TABS
650.0000 mg | ORAL_TABLET | Freq: Four times a day (QID) | ORAL | Status: DC | PRN
  Administered 2014-07-06 (×2): 650 mg via ORAL
  Filled 2014-07-02 (×2): qty 2

## 2014-07-02 MED ORDER — ONDANSETRON HCL 4 MG/2ML IJ SOLN: 4.0000 mg | Freq: Four times a day (QID) | INTRAMUSCULAR | Status: DC | PRN

## 2014-07-02 MED ORDER — PROMETHAZINE HCL 25 MG/ML IJ SOLN: 6.2500 mg | INTRAMUSCULAR | Status: DC | PRN

## 2014-07-02 MED ORDER — DOCUSATE SODIUM 100 MG PO CAPS
100.0000 mg | ORAL_CAPSULE | Freq: Two times a day (BID) | ORAL | Status: DC
  Administered 2014-07-02 – 2014-07-06 (×6): 100 mg via ORAL
  Filled 2014-07-02 (×2): qty 1

## 2014-07-02 MED ORDER — METOCLOPRAMIDE HCL 5 MG/ML IJ SOLN: 5.0000 mg | Freq: Three times a day (TID) | INTRAMUSCULAR | Status: DC | PRN

## 2014-07-02 MED ORDER — FLEET ENEMA 7-19 GM/118ML RE ENEM: 1.0000 | ENEMA | Freq: Once | RECTAL | Status: AC | PRN

## 2014-07-02 MED ORDER — LACTATED RINGERS IV SOLN: INTRAVENOUS | Status: DC

## 2014-07-02 MED ORDER — FENTANYL CITRATE 0.05 MG/ML IJ SOLN: 25.0000 ug | INTRAMUSCULAR | Status: DC | PRN

## 2014-07-02 MED ORDER — ONDANSETRON HCL 4 MG PO TABS: 4.0000 mg | ORAL_TABLET | Freq: Four times a day (QID) | ORAL | Status: DC | PRN

## 2014-07-02 MED ORDER — MENTHOL 3 MG MT LOZG: 1.0000 | LOZENGE | OROMUCOSAL | Status: DC | PRN

## 2014-07-02 MED ORDER — PROPOFOL 10 MG/ML IV BOLUS
INTRAVENOUS | Status: AC
  Filled 2014-07-02: qty 20

## 2014-07-02 MED ORDER — TRAMADOL HCL 50 MG PO TABS: 50.0000 mg | ORAL_TABLET | Freq: Four times a day (QID) | ORAL | Status: DC | PRN

## 2014-07-02 MED ORDER — 0.9 % SODIUM CHLORIDE (POUR BTL) OPTIME
TOPICAL | Status: DC | PRN
  Administered 2014-07-02: 1000 mL

## 2014-07-02 MED ORDER — MIDAZOLAM HCL 5 MG/5ML IJ SOLN
INTRAMUSCULAR | Status: DC | PRN
  Administered 2014-07-02 (×2): .25 mg via INTRAVENOUS

## 2014-07-02 MED ORDER — METOCLOPRAMIDE HCL 5 MG PO TABS
5.0000 mg | ORAL_TABLET | Freq: Three times a day (TID) | ORAL | Status: DC | PRN
  Filled 2014-07-02: qty 2

## 2014-07-02 MED ORDER — KETAMINE HCL 50 MG/ML IJ SOLN
INTRAMUSCULAR | Status: DC | PRN
  Administered 2014-07-02: 30 mg via INTRAMUSCULAR

## 2014-07-02 MED ORDER — CEFAZOLIN SODIUM 1-5 GM-% IV SOLN
1.0000 g | Freq: Four times a day (QID) | INTRAVENOUS | Status: AC
  Administered 2014-07-02 – 2014-07-03 (×2): 1 g via INTRAVENOUS
  Filled 2014-07-02 (×2): qty 50

## 2014-07-02 MED ORDER — PROPOFOL INFUSION 10 MG/ML OPTIME
INTRAVENOUS | Status: DC | PRN
  Administered 2014-07-02: 75 ug/kg/min via INTRAVENOUS

## 2014-07-02 MED ORDER — PHENOL 1.4 % MT LIQD: 1.0000 | OROMUCOSAL | Status: DC | PRN

## 2014-07-02 MED ORDER — KETAMINE HCL 10 MG/ML IJ SOLN
INTRAMUSCULAR | Status: AC
  Filled 2014-07-02: qty 1

## 2014-07-02 MED ORDER — BUPIVACAINE HCL (PF) 0.75 % IJ SOLN
INTRAMUSCULAR | Status: DC | PRN
  Administered 2014-07-02: 1 mL

## 2014-07-02 MED ORDER — GLYCOPYRROLATE 0.2 MG/ML IJ SOLN
INTRAMUSCULAR | Status: AC
  Filled 2014-07-02: qty 3

## 2014-07-02 MED ORDER — MIDAZOLAM HCL 2 MG/2ML IJ SOLN
INTRAMUSCULAR | Status: AC
  Filled 2014-07-02: qty 2

## 2014-07-02 MED ORDER — ACETAMINOPHEN 650 MG RE SUPP: 650.0000 mg | Freq: Four times a day (QID) | RECTAL | Status: DC | PRN

## 2014-07-02 MED ORDER — PROPOFOL 10 MG/ML IV BOLUS
INTRAVENOUS | Status: DC | PRN
  Administered 2014-07-02: 15 mg via INTRAVENOUS

## 2014-07-02 MED ORDER — BISACODYL 10 MG RE SUPP: 10.0000 mg | Freq: Every day | RECTAL | Status: DC | PRN

## 2014-07-02 MED ORDER — FENTANYL CITRATE 0.05 MG/ML IJ SOLN
INTRAMUSCULAR | Status: AC
  Filled 2014-07-02: qty 2

## 2014-07-02 MED ORDER — FENTANYL CITRATE 0.05 MG/ML IJ SOLN
INTRAMUSCULAR | Status: DC | PRN
  Administered 2014-07-02: 25 ug via INTRAVENOUS

## 2014-07-02 MED ORDER — POLYETHYLENE GLYCOL 3350 17 G PO PACK: 17.0000 g | PACK | Freq: Every day | ORAL | Status: DC | PRN

## 2014-07-02 MED ORDER — TRAMADOL HCL 50 MG PO TABS
50.0000 mg | ORAL_TABLET | Freq: Two times a day (BID) | ORAL | Status: DC | PRN
  Administered 2014-07-05: 50 mg via ORAL
  Filled 2014-07-02: qty 1

## 2014-07-02 MED ORDER — LACTATED RINGERS IV SOLN
INTRAVENOUS | Status: DC | PRN
  Administered 2014-07-02 (×2): via INTRAVENOUS

## 2014-07-02 MED ORDER — CEFAZOLIN SODIUM 1-5 GM-% IV SOLN
1.0000 g | Freq: Once | INTRAVENOUS | Status: AC
  Administered 2014-07-02: 1 g via INTRAVENOUS
  Filled 2014-07-02: qty 50

## 2014-07-02 MED ORDER — ENOXAPARIN SODIUM 30 MG/0.3ML ~~LOC~~ SOLN
30.0000 mg | SUBCUTANEOUS | Status: DC
  Administered 2014-07-03 – 2014-07-06 (×4): 30 mg via SUBCUTANEOUS
  Filled 2014-07-02 (×6): qty 0.3

## 2014-07-02 SURGICAL SUPPLY — 37 items
BAG ZIPLOCK 12X15 (MISCELLANEOUS) ×3 IMPLANT
BIT DRILL CANN LG 4.3MM (BIT) ×1 IMPLANT
BNDG COHESIVE 6X5 TAN STRL LF (GAUZE/BANDAGES/DRESSINGS) ×3 IMPLANT
CLOSURE WOUND 1/2 X4 (GAUZE/BANDAGES/DRESSINGS) ×2
DRAPE STERI IOBAN 125X83 (DRAPES) ×3 IMPLANT
DRAPE TABLE BACK 44X90 PK DISP (DRAPES) ×3 IMPLANT
DRILL BIT CANN LG 4.3MM (BIT) ×3
DRSG MEPILEX BORDER 4X8 (GAUZE/BANDAGES/DRESSINGS) ×6 IMPLANT
DRSG PAD ABDOMINAL 8X10 ST (GAUZE/BANDAGES/DRESSINGS) IMPLANT
DURAPREP 26ML APPLICATOR (WOUND CARE) ×3 IMPLANT
ELECT REM PT RETURN 9FT ADLT (ELECTROSURGICAL) ×3
ELECTRODE REM PT RTRN 9FT ADLT (ELECTROSURGICAL) ×1 IMPLANT
GAUZE SPONGE 4X4 12PLY STRL (GAUZE/BANDAGES/DRESSINGS) IMPLANT
GLOVE BIO SURGEON STRL SZ7.5 (GLOVE) ×3 IMPLANT
GLOVE BIO SURGEON STRL SZ8 (GLOVE) ×6 IMPLANT
GLOVE BIOGEL PI IND STRL 8 (GLOVE) ×1 IMPLANT
GLOVE BIOGEL PI INDICATOR 8 (GLOVE) ×2
GOWN STRL REUS W/TWL LRG LVL3 (GOWN DISPOSABLE) ×3 IMPLANT
GOWN STRL REUS W/TWL XL LVL3 (GOWN DISPOSABLE) ×3 IMPLANT
GUIDEPIN 3.2X17.5 THRD DISP (PIN) ×3 IMPLANT
GUIDEWIRE BALL NOSE 80CM (WIRE) ×3 IMPLANT
HIP FRA NAIL LAG SCREW 10.5X90 (Orthopedic Implant) ×3 IMPLANT
KIT BASIN OR (CUSTOM PROCEDURE TRAY) ×3 IMPLANT
MANIFOLD NEPTUNE II (INSTRUMENTS) ×3 IMPLANT
NAIL HIP FRACT 130D 11X180 (Screw) ×3 IMPLANT
NS IRRIG 1000ML POUR BTL (IV SOLUTION) IMPLANT
PACK GENERAL/GYN (CUSTOM PROCEDURE TRAY) ×3 IMPLANT
PADDING CAST COTTON 6X4 STRL (CAST SUPPLIES) IMPLANT
POSITIONER SURGICAL ARM (MISCELLANEOUS) ×3 IMPLANT
SCREW BONE CORTICAL 5.0X3 (Screw) ×3 IMPLANT
SCREW LAG HIP FRA NAIL 10.5X90 (Orthopedic Implant) ×1 IMPLANT
STRIP CLOSURE SKIN 1/2X4 (GAUZE/BANDAGES/DRESSINGS) ×4 IMPLANT
SUT MNCRL AB 4-0 PS2 18 (SUTURE) ×3 IMPLANT
SUT VIC AB 0 CT1 27 (SUTURE) ×2
SUT VIC AB 0 CT1 27XBRD ANTBC (SUTURE) ×1 IMPLANT
TOWEL OR 17X26 10 PK STRL BLUE (TOWEL DISPOSABLE) ×6 IMPLANT
TRAY FOLEY CATH 14FRSI W/METER (CATHETERS) IMPLANT

## 2014-07-02 NOTE — Progress Notes (Signed)
Clinical Social Work Department CLINICAL SOCIAL WORK PLACEMENT NOTE 07/02/2014  Patient:  Kim Hodges, Kim Hodges  Account Number:  [id number] Admit date:  06/30/2014  Clinical Social Worker:  Cori Razor, LCSW  Date/time:  07/02/2014 12:54 PM  Clinical Social Work is seeking post-discharge placement for this patient at the following level of care:   SKILLED NURSING   (*CSW will update this form in Epic as items are completed)     Patient/family provided with [facility] Department of Clinical Social Work's list of facilities offering this level of care within the geographic area requested by the patient (or if unable, by the patient's family).  07/02/2014  Patient/family informed of their freedom to choose among providers that offer the needed level of care, that participate in Medicare, Medicaid or managed care program needed by the patient, have an available bed and are willing to accept the patient.    Patient/family informed of [facility]' ownership interest in [facility], as well as of the fact that they are under no obligation to receive care at this facility.  PASARR submitted to EDS on 07/02/2014 PASARR number received on 07/02/2014  FL2 transmitted to all facilities in geographic area requested by pt/family on  07/02/2014 FL2 transmitted to all facilities within larger geographic area on   Patient informed that his/her managed care company has contracts with or will negotiate with  certain facilities, including the following:     Patient/family informed of bed offers received:   Patient chooses bed at  Physician recommends and patient chooses bed at    Patient to be transferred to  on   Patient to be transferred to facility by  Patient and family notified of transfer on  Name of family member notified:    The following physician request were entered in Epic:   Additional Comments:  Cori Razor LCSW [phone]

## 2014-07-02 NOTE — Interval H&P Note (Signed)
History and Physical Interval Note:  07/02/2014 12:32 PM  Kim Hodges  has presented today for surgery, with the diagnosis of fractured left hip  The various methods of treatment have been discussed with the patient and family. After consideration of risks, benefits and other options for treatment, the patient has consented to  Procedure(s): INTRAMEDULLARY (IM) NAIL FEMORAL (Left) as a surgical intervention .  The patient's history has been reviewed, patient examined, no change in status, stable for surgery.  I have reviewed the patient's chart and labs.  Questions were answered to the patient's satisfaction.     Loanne Drilling

## 2014-07-02 NOTE — Progress Notes (Signed)
CARE MANAGEMENT NOTE 07/02/2014  Patient:  LATEEFA, CROSBY   Account Number:  [id number]  Date Initiated:  07/02/2014  Documentation initiated by:  Nahia Nissan  Subjective/Objective Assessment:   79 y.o. female sustained left closed hip fracture/patient admitted late in day./cardiac clearance required for orif due to age and general condition     Action/Plan:   from assisted living [facility]/may require str in snf   Anticipated DC Date:  07/05/2014   Anticipated DC Plan:  SKILLED NURSING FACILITY  In-house referral  Clinical Social Worker      DC Planning Services  CM consult      [facility] Choice  NA   Choice offered to / List presented to:  NA   DME arranged  NA      DME agency  NA     HH arranged  NA      HH agency  NA   Status of service:  In process, will continue to follow Medicare Important Message given?   (If response is "NO", the following Medicare IM given date fields will be blank) Date Medicare IM given:   Medicare IM given by:   Date Additional Medicare IM given:   Additional Medicare IM given by:    Discharge Disposition:    Per UR Regulation:  Reviewed for med. necessity/level of care/duration of stay  If discussed at Long Length of Stay Meetings, dates discussed:    Comments:  [id number]/Toniqua Melamed,RN,BSN,CCM

## 2014-07-02 NOTE — Anesthesia Preprocedure Evaluation (Addendum)
Anesthesia Evaluation  Patient identified by MRN, date of birth, ID band Patient awake    Reviewed: Allergy & Precautions, H&P , NPO status , Patient's Chart, lab work & pertinent test results  Airway Mallampati: II TM Distance: >3 FB Neck ROM: Limited    Dental no notable dental hx.    Pulmonary neg pulmonary ROS,  breath sounds clear to auscultation  Pulmonary exam normal       Cardiovascular hypertension, + Valvular Problems/Murmurs AI and MR Rhythm:Regular Rate:Normal + Systolic murmurs Impressions:  - Normal LV function; grade 2 diastolic dysfunction with elevated   left ventricular filling pressures; moderate LAE; calcified   aortic valve with moderate AI and mild AS; moderate MR; severely   elevated pulmonary pressures.    Neuro/Psych negative neurological ROS  negative psych ROS   GI/Hepatic negative GI ROS, Neg liver ROS,   Endo/Other  negative endocrine ROSHypothyroidism   Renal/GU Renal InsufficiencyRenal disease  negative genitourinary   Musculoskeletal negative musculoskeletal ROS (+)   Abdominal   Peds negative pediatric ROS (+)  Hematology  (+) anemia ,   Anesthesia Other Findings   Reproductive/Obstetrics negative OB ROS (+) Pregnancy                          Anesthesia Physical Anesthesia Plan  ASA: IV  Anesthesia Plan: Spinal   Post-op Pain Management:    Induction: Intravenous  Airway Management Planned: Simple Face Mask  Additional Equipment:   Intra-op Plan:   Post-operative Plan: Extubation in OR  Informed Consent: I have reviewed the patients History and Physical, chart, labs and discussed the procedure including the risks, benefits and alternatives for the proposed anesthesia with the patient or authorized representative who has indicated his/her understanding and acceptance.   Dental advisory given  Plan Discussed with: CRNA and  Surgeon  Anesthesia Plan Comments:         Anesthesia Quick Evaluation

## 2014-07-02 NOTE — Anesthesia Procedure Notes (Signed)
Spinal  Patient location during procedure: OR Staffing Performed by: anesthesiologist  Preanesthetic Checklist Completed: patient identified, site marked, surgical consent, pre-op evaluation, timeout performed, IV checked, risks and benefits discussed and monitors and equipment checked Spinal Block Patient position: sitting Prep: Betadine Patient monitoring: heart rate, continuous pulse ox and blood pressure Injection technique: single-shot Needle Needle type: Spinocan and Tuohy  Needle length: 9 cm Additional Notes Performed continuous spinal with 20G tuohy and standard epidural catheter. Good CSF flow, catheter secured with tegaderm and paper tape. Will be removed upon arrival to PACU     Expiration date of kit checked and confirmed. Patient tolerated procedure well, without complications.

## 2014-07-02 NOTE — Transfer of Care (Signed)
Immediate Anesthesia Transfer of Care Note  Patient: Kim Hodges  Procedure(s) Performed: Procedure(s): INTRAMEDULLARY (IM) NAIL FEMORAL (Left)  Patient Location: PACU  Anesthesia Type:Regional and Spinal  Level of Consciousness: awake, sedated and patient cooperative  Airway & Oxygen Therapy: Patient Spontanous Breathing and Patient connected to face mask oxygen  Post-op Assessment: Report given to PACU RN and Post -op Vital signs reviewed and stable  Post vital signs: Reviewed and stable  Complications: No apparent anesthesia complications

## 2014-07-02 NOTE — Progress Notes (Signed)
Clinical Social Work Department BRIEF PSYCHOSOCIAL ASSESSMENT 07/02/2014  Patient:  DAKOTA, STANGL     Account Number:  [id number]     Admit date:  06/30/2014  Clinical Social Worker:  Candie Chroman  Date/Time:  07/02/2014 01:04 PM  Referred by:  Physician  Date Referred:  07/02/2014 Referred for  SNF Placement   Other Referral:   Interview type:  Other - See comment Other interview type:   chart reviewe / ALF contact    PSYCHOSOCIAL DATA Living Status:  FACILITY Admitted from facility:  [facility] Level of care:  Assisted Living Primary support name:  Earvin Hansen Primary support relationship to patient:  CHILD, ADULT Degree of support available:   supportive    CURRENT CONCERNS Current Concerns  Post-Acute Placement   Other Concerns:    SOCIAL WORK ASSESSMENT / PLAN Pt is a 80 yr old female admitted from [facility]F with a hip and compression fx. Pt is scheduled for surgery today. CSW will see pt following surgery to confirm d/c plan to return to [facility]. SNF unit at facility will be needed at d/c. [facility] is aware but unsure if they will have an opening. [facility] may be able to assist , if necessary. CSW will continue to follow to assist with d/c planning to SNF.   Assessment/plan status:  Psychosocial Support/Ongoing Assessment of Needs Other assessment/ plan:   Information/referral to community resources:   None needed at this time    PATIENT'S/FAMILY'S RESPONSE TO PLAN OF CARE: Pt presently in surgery. CSW plans to meet with pt/family following surgery.  [facility] have a [facility]F bed available at d/c. D/C Planning is ongoing.    Cori Razor LCSW [phone]

## 2014-07-02 NOTE — Consult Note (Signed)
GREEN, Lenon Curt, MD Chief Complaint: CTSP for cervical fx  History: Ms. Naeem is a 80 yo female who had a mechanical fall at her residence ([facility]) this evening. She was reaching for her walker and fell backward onto her buttocks with immediate pain in her left hip and inability to get up. She did not have any prodromal symptoms leading to the fall. She did not hit her head or have a loss of consciousness. Her only complaint is left hip pain radiating to her left knee. She denies any numbness or tingling in her left leg/foot. 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 Chronic kidney disease, stage II (mild) 12/25/2012  . Unspecified arthropathy, lower leg 09/11/2011  . Swelling of limb 09/10/2012  . Other and unspecified hyperlipidemia 07/26/2011  . Actinic keratosis 07/26/2011  . Abnormality of gait 07/26/2011  . Hyperglycemia 07/26/2011  . Tear film insufficiency, unspecified 12/29/2009  . Other malaise and fatigue 12/29/2009  . Unspecified hearing loss 06/30/2009  . Unspecified hypothyroidism 12/15/2008  . Unspecified essential hypertension 12/15/2008  . Unspecified glaucoma 12/15/2008    both eyes  . Weak 08/02/13  . Abnormality of gait 08/02/13  . Hyponatremia 08/02/13  . Herpes zoster 08/02/13  . Post herpetic neuralgia 08/02/13  . Cerebral atrophy 08/02/13  . Cerebrovascular disease 08/02/13  . Atherosclerosis 08/02/13  . Abnormal chest x-ray 08/02/13    08/02/13 nodule in left mid to upper lung  . Murmur, cardiac 08/02/13    aortic ejection  . Fracture lumbar vertebra-closed 08/02/13    L1  . Memory changes 07/03/13  . Lung mass 07/01/2014    No Known Allergies  No current facility-administered medications on file prior to encounter.   Current Outpatient Prescriptions on File Prior to Encounter  Medication Sig Dispense Refill  . acetaminophen (TYLENOL ARTHRITIS PAIN) 650 MG CR tablet Take 650 mg by mouth 3  (three) times daily as needed for pain. May also take one tablet every 6 hours as needed for pain      . dorzolamide-timolol (COSOPT) 22.3-6.8 MG/ML ophthalmic solution Place 1 drop into both eyes 2 (two) times daily.       Marland Kitchen gabapentin (NEURONTIN) 100 MG capsule Take 100 mg by mouth 3 (three) times daily.       Marland Kitchen HYDROcodone-acetaminophen (NORCO/VICODIN) 5-325 MG per tablet Take 0.5 tablets by mouth every 6 (six) hours as needed for moderate pain.      Marland Kitchen levothyroxine (SYNTHROID, LEVOTHROID) 75 MCG tablet Take 75 mcg by mouth daily before breakfast.      . omeprazole (PRILOSEC) 20 MG capsule Take 20 mg by mouth daily.      Marland Kitchen Specialty Vitamins Products (ICAPS LUTEIN-ZEAXANTHIN PO) Take 1 tablet by mouth daily.        Physical Exam: Filed Vitals:   07/02/14 1715  BP: 192/44  Pulse: 72  Temp:   Resp: 13   A+) x3 No sob/cp ABD soft/NT Neuro intact  Compartments soft/nt No significant neck pain with palpation No step off.  No pain with gentle ROM  Image: Dg Chest 1 View  06/30/2014   CLINICAL DATA:  Pain post trauma  EXAM: CHEST - 1 VIEW  COMPARISON:  Chest CT May 24, 2014  FINDINGS: Mass in the left upper lobe is again noted measuring approximately 2.3 x 1.8 cm. Elsewhere, lungs are clear. Heart is upper normal in size with pulmonary vascularity within normal limits.  No adenopathy. No pneumothorax. No fractures apparent.  IMPRESSION: Right upper lobe mass again noted, suspicious for neoplasm. Elsewhere lungs clear. No pneumothorax apparent.   Electronically Signed   By: Bretta Bang M.D.   On: 06/30/2014 19:02   Dg Hip Complete Left  06/30/2014   CLINICAL DATA:  Fall.  Left leg deformity and pain.  EXAM: LEFT HIP - COMPLETE 2+ VIEW  COMPARISON:  08/02/2013.  FINDINGS: Frontal pelvis with AP and frog-leg lateral views of the left hip show a left intertrochanteric proximal femur fracture with varus angulation. No evidence for pubic ramus fracture. SI joints and symphysis pubis are  unremarkable.  IMPRESSION: Intertrochanteric left femur fracture with varus angulation.   Electronically Signed   By: Kennith Center M.D.   On: 06/30/2014 19:03   Dg Hip Operative Left  07/02/2014   CLINICAL DATA:  Intra medullary nail placement for proximal femur fracture  EXAM: OPERATIVE LEFT HIP -two views  COMPARISON:  Preoperative evaluation pelvis and left hip June 30, 2014  FINDINGS: Frontal and lateral views were obtained. There is screw and nail fixation through an intertrochanteric femur fracture on the left. Alignment is near anatomic. The tip of the screws in the proximal femoral head. No dislocation.  IMPRESSION: Alignment near anatomic at the fracture site.  No dislocation.   Electronically Signed   By: Bretta Bang M.D.   On: 07/02/2014 13:52   Dg Ankle Complete Right  06/30/2014   CLINICAL DATA:  Bruising post trauma  EXAM: RIGHT ANKLE - COMPLETE 3+ VIEW  COMPARISON:  None.  FINDINGS: Frontal, oblique, and lateral views were obtained. There is a small avulsion arising from the lateral malleolus. There is a second small avulsion arising from the dorsal distal talus. There is no appreciable joint effusion. There is mild generalized soft tissue swelling. The ankle mortise appears intact. There is a spur arising from the inferior calcaneus.  IMPRESSION: Small lateral malleolar avulsion. There is an avulsion also arising from the dorsal distal talus. Ankle mortise appears intact.   Electronically Signed   By: Bretta Bang M.D.   On: 06/30/2014 19:04   Ct Cervical Spine Wo Contrast  06/30/2014   CLINICAL DATA:  Larey Seat.  Neck pain.  EXAM: CT CERVICAL SPINE WITHOUT CONTRAST  TECHNIQUE: Multidetector CT imaging of the cervical spine was performed without intravenous contrast. Multiplanar CT image reconstructions were also generated.  COMPARISON:  None.  FINDINGS: Advanced degenerative cervical spondylosis with multilevel disc disease and facet disease. The overall alignment is maintained.  The spinal canal is fairly generous. There is a compression type fracture of the T1 vertebral body. There is also a laminar fracture on the left at C7.  No other definite fractures are identified. The C1-2 articulations are maintained. The dens is intact.  IMPRESSION: T1 compression fracture and left C7 laminar fracture.  Advanced degenerative cervical spondylosis with multilevel disc disease and facet disease.   Electronically Signed   By: Loralie Champagne M.D.   On: 06/30/2014 21:44   Dg Pelvis Portable  07/02/2014   CLINICAL DATA:  Postop ORIF of left proximal femur.  EXAM: PORTABLE PELVIS 1-2 VIEWS  COMPARISON:  Intraoperative fluoroscopic views 07/02/2014 pelvis and left hip radiographs 06/30/2014  FINDINGS: There are postoperative changes of intramedullary nail placement for a left intertrochanteric femur fracture. Fracture fragments are in near-anatomic alignment. No hardware complication is identified. No new fractures seen. Expected locules of gas are seen in the soft tissues about the proximal left femur.  IMPRESSION:  Status post ORIF for left proximal femur fracture. No unexpected or acute findings.   Electronically Signed   By: Britta Mccreedy M.D.   On: 07/02/2014 14:58   Dg Foot Complete Right  06/30/2014   CLINICAL DATA:  Status post fall.  Bruising  EXAM: RIGHT FOOT COMPLETE - 3+ VIEW  COMPARISON:  None.  FINDINGS: The bones are osteopenic. There is mild degenerative changes at the first MTP joint. There is no evidence of arthropathy or other focal bone abnormality. Small plantar heel spur noted. Soft tissues are unremarkable. There is mild diffuse soft tissue swelling noted.  IMPRESSION: 1. Soft tissue swelling. 2. Mild chronic degenerative change.   Electronically Signed   By: Signa Kell M.D.   On: 06/30/2014 19:05    A/P:  Patient with C6 lateral mass fracture, C7 lamina fx, and T1 compression fx. No evidence of neurologic deficits Question time of injury (3 weeks ago secondary to  fall) Patient currently in cervical collar Although there is potential for instability given lateral mass fracture currently she is comfortable and neurologically intact Would not recommend cervical fusion in a patient who in 40 yrs of age.  Furthermore, patient not interested in surgical management Would treat conservatively in collar Will see her in 4 weeks for repeat x-rays If pain or neuro deficits presents then will consider halo or ORIF

## 2014-07-02 NOTE — Progress Notes (Addendum)
Subjective: Hospital day - 2 Patient reports pain as mild.   Patient seen in rounds with Dr. Lequita Halt. Family in room at bedside.  Did not have ECHO results at the time of morning rounds but late found them on the chart.  Tentative plan is to proceed with surgery after lunch today.  Discussed with family the increased risks.  Informed the family they will need to discuss the ECHO results with the medical team.  The patient was placed into an Aspen Collar for the C7 laminar fracture that appears to be subacute, occurring likely about 3 weeks ago based on the history obtained from the family.  The family wisshed to proceed with the surgery.  Will reorder the consent and also make sure there is a Type and Cross for blood. Patient is well, and has had no acute complaints or problems   Objective: Vital signs in last 24 hours: Temp:  [98.8 F (37.1 C)-99.4 F (37.4 C)] 99 F (37.2 C) (09/11 0542) Pulse Rate:  [69-80] 69 (09/11 0542) Resp:  [15-18] 15 (09/11 0542) BP: (156-181)/(40-71) 156/40 mmHg (09/11 0542) SpO2:  [95 %-96 %] 96 % (09/11 0542)  Intake/Output from previous day:  Intake/Output Summary (Last 24 hours) at 07/02/14 0806 Last data filed at 07/02/14 0543  Gross per 24 hour  Intake    800 ml  Output   1353 ml  Net   -553 ml    Intake/Output this shift:    Labs:  Recent Labs  06/30/14 1807 07/01/14 0632 07/02/14 0435  HGB 10.6* 9.8* 9.1*    Recent Labs  07/01/14 0632 07/02/14 0435  WBC 9.7 10.3  RBC 3.27* 3.03*  HCT 29.3* 27.1*  PLT 203 203    Recent Labs  07/01/14 0632 07/02/14 0435  NA 132* 135*  K 4.3 3.7  CL 96 98  CO2 24 24  BUN 23 23  CREATININE 0.91 0.84  GLUCOSE 112* 102*  CALCIUM 8.8 8.6    Recent Labs  07/01/14 0632 07/02/14 0435  INR 1.18 1.20    EXAM General - Patient is Alert Extremity - Neurovascular intact Sensation intact distally Motor Function - intact, moving foot and toes well on exam.   Past Medical History    Diagnosis Date  . Chronic kidney disease, stage II (mild) 12/25/2012  . Unspecified arthropathy, lower leg 09/11/2011  . Swelling of limb 09/10/2012  . Other and unspecified hyperlipidemia 07/26/2011  . Actinic keratosis 07/26/2011  . Abnormality of gait 07/26/2011  . Hyperglycemia 07/26/2011  . Tear film insufficiency, unspecified 12/29/2009  . Other malaise and fatigue 12/29/2009  . Unspecified hearing loss 06/30/2009  . Unspecified hypothyroidism 12/15/2008  . Unspecified essential hypertension 12/15/2008  . Unspecified glaucoma 12/15/2008    both eyes  . Weak 08/02/13  . Abnormality of gait 08/02/13  . Hyponatremia 08/02/13  . Herpes zoster 08/02/13  . Post herpetic neuralgia 08/02/13  . Cerebral atrophy 08/02/13  . Cerebrovascular disease 08/02/13  . Atherosclerosis 08/02/13  . Abnormal chest x-ray 08/02/13    08/02/13 nodule in left mid to upper lung  . Murmur, cardiac 08/02/13    aortic ejection  . Fracture lumbar vertebra-closed 08/02/13    L1  . Memory changes 07/03/13  . Lung mass 07/01/2014    Assessment/Plan: Hospital day - 2 Principal Problem:   Closed left hip fracture Active Problems:   Chronic kidney disease, stage II (mild)   Hypothyroidism   Unspecified essential hypertension   Nodule of left lung  Pulmonary hypertension   Anemia of chronic disease   GERD (gastroesophageal reflux disease)   Traumatic compression fracture of T1 thoracic vertebra   C7 cervical fracture   Lung mass; LUL  Estimated body mass index is 21.42 kg/(m^2) as calculated from the following:   Height as of this encounter:  (1.549 m).   Weight as of this encounter: 51.4 kg (113 lb 5.1 oz). Bedrest Consent Type and Cross NPO this AM in anticipation of surgery. Discontinued heparin and aspirin this morning.  Avel Peace, PA-C Orthopaedic Surgery 07/02/2014, 8:06 AM

## 2014-07-02 NOTE — Anesthesia Postprocedure Evaluation (Signed)
  Anesthesia Post-op Note  Patient: Kim Hodges  Procedure(s) Performed: Procedure(s) (LRB): INTRAMEDULLARY (IM) NAIL FEMORAL (Left)  Patient Location: PACU  Anesthesia Type: Spinal  Level of Consciousness: awake and alert   Airway and Oxygen Therapy: Patient Spontanous Breathing  Post-op Pain: mild  Post-op Assessment: Post-op Vital signs reviewed, Patient's Cardiovascular Status Stable, Respiratory Function Stable, Patent Airway and No signs of Nausea or vomiting  Last Vitals:  Filed Vitals:   07/02/14 1500  BP:   Pulse: 73  Temp: 36.5 C  Resp:     Post-op Vital Signs: stable   Complications: No apparent anesthesia complications

## 2014-07-02 NOTE — Op Note (Signed)
  OPERATIVE REPORT  PREOPERATIVE DIAGNOSIS: Left intertrochanteric femur fracture.  POSTOP DIAGNOSIS: Left intertrochanteric femur fracture.  PROCEDURE: Intramedullary nailing, Left intertrochanteric femur  fracture.  SURGEON: Ollen Gross, M.D.   ASSISTANT: Alexzandrew L. Perkins, P.A.C.   ANESTHESIA:Spinal  Estimated BLOOD LOSS: minimal  DRAINS: None.   COMPLICATIONS:   None  CONDITION: -PACU - hemodynamically stable.    CLINICAL NOTE: Kim Hodges is an 80 y.o. female, who had a fall 2  days ago sustaining a displaced  Left intertrochanteric femur fracture. She presents now for operative fixation    PROCEDURE IN DETAIL: After successful administration of  Spinal,  the patient was placed on the fracture table with Left lower extremity in a well-padded traction boot,  Right lower extremity in a well-padded leg holder. Under fluoroscopic guidance, the fracture was reduced. The traction was locked in this position. Thigh was prepped  and draped in the usual sterile fashion. The guide pin for the Biomet  Affixus was then passed percutaneously to the tip of the greater  trochanter, was entered into the femoral canal. It was passed into the  canal. The small incision was made and the starter reamer passed over  the guide pin. This was then removed. The nail which was an 11 mm  diameter short trochanteric nail with 130 degrees angle was attached to  the external guide and then passed into the femoral canal, impacted to  the appropriate depth in the canal, then we used the external guide to  place the lag screw. Through the external guide, a guide pin was  passed. Small incision made, and the guide pin was in the center of the  femoral head on the AP and slightly center to posterior on the lateral.  Length was 90 mm. Triple reamer was passed over the guide pin. 90 mm  lag screw was placed. It was then locked down with a locking screw.  Through the external guide, the distal  interlock was placed through the  static hole and this was 32 mm in length with excellent bicortical  purchase. The external guide was then removed. Hardware was in good  position and fracture was well reduced. Wound was copiously irrigated with saline  solution, and  closed deep with interrupted 1 Vicryl, subcu  interrupted 2-0 Vicryl, subcuticular running 4-0 Monocryl. Incision was  cleaned and dried and sterile dressings applied. She was awakened and  transported to recovery in stable condition.   Kim Rankin Hebert Dooling, MD    07/02/2014, 1:44 PM

## 2014-07-02 NOTE — Progress Notes (Signed)
[facility] PROGRESS NOTE  Kim Hodges ZOX:[id number] DOB: 03/22/1909 DOA: 06/30/2014 PCP: [name], MD  Assessment/Plan: #1 left intertrochanteric femur fracture Secondary to mechanical fall. Patient has been seen by orthopedics and due to some concerns for possible abnormalities on prior echo repeat 2-D echo has been ordered. Patient denies any chest pain or shortness of breath. Patient denies any cardiac history. Patient had cardiac catheterization in 2012 with normal coronaries.patient had a 2-D echo in September of 2005 with EF of 55-65% with no wall motion abnormalities. Mild aortic valve thickness, mild to moderate mitral annular calcification consistent with mild mitral stenosis left atrium was mildly dilated. Repeat 2-D echo with EF of 60-65%, mild aortic valve stenosis, severe pulmonary hypertension, grade 2 diastolic dysfunction. Patient is currently asymptomatic. Continue Coreg and low-dose Norvasc. Patient is moderate to high risk based on age. 2-D echo results is not prohibitive for surgery. Orthopedics is following and patient scheduled for surgery this afternoon.  #2 T1 compression fracture of C7 lamina fracture A CT of the C-spine.. Patient denies any numbness and tingling in the upper extremities and on the complain of some mild neck discomfort.  Patient is moving her neck. Due to probable surgical intervention needed at patient's left femur fracture orthopedics recommended neurosurgical evaluation for further input. Patient with aspen collar. Ortho spine consult pending.   #3 history of lung mass Patient with a prior history of a pulmonary mass suspicious for malignancy however family decided not to pursue any further workup at this time. Patient is not hypoxic. Patient is not tachypneic. Continue pulmonary toilet.  #4 severe pulmonary hypertension/moderate aortic stenosis Per 2-D echo. Patient currently asymptomatic. Continue beta blocker and low-dose Norvasc.  Outpatient followup.  #5 postherpetic neuralgia Continue home pain regimen of fentanyl patch and gabapentin.  #6 hypothyroidism Continue home dose Synthroid.  #7 hypertension Continue low-dose Coreg and norvasc.  #8 gastroesophageal reflux disease PPI.  #9 anemia Likely secondary to anemia of chronic disease. H&H stable. No overt bleeding. Follow.  #10 prophylaxis Protonix for GI prophylaxis. Heparin for DVT prophylaxis.  Code Status: DO NOT RESUSCITATE Family Communication: updated patient and family at bedside. Disposition Plan: remain inpatient. Probable SNF when ready for discharge.   Consultants:  Orthopedics: Dr Lequita Halt 009/06/2014  Procedures:  CT C-spine 06/30/2014  X-ray of the left hip 06/30/2014  X-ray of the right foot 06/30/2014  Chest x-ray 06/30/2001  X-ray of the right ankle 06/30/2014  2 d echo 07/01/14  Antibiotics:  None  HPI/Subjective: Patient c/o soreness in left thigh. Patient denies any numbness or tingling in BUE. No complaints.  Objective: Filed Vitals:   07/02/14 0800  BP:   Pulse:   Temp:   Resp: 16    Intake/Output Summary (Last 24 hours) at 07/02/14 0906 Last data filed at 07/02/14 0543  Gross per 24 hour  Intake    800 ml  Output   1353 ml  Net   -553 ml   Filed Weights   06/30/14 2230  Weight: 51.4 kg (113 lb 5.1 oz)    Exam:   General:  NAD. Aspen collar on.  Cardiovascular: RRR  Respiratory: CTAB  Abdomen: Soft/NT/ND/+BS  Musculoskeletal: No c/c/e. LLE shortened and externally rotated.  Data Reviewed: Basic Metabolic Panel:  Recent Labs Lab 06/30/14 1807 07/01/14 0632 07/02/14 0435  NA 131* 132* 135*  K 4.7 4.3 3.7  CL 95* 96 98  CO2 GLUCOSE 118* 112* 102*  BUN 27* 23 23  CREATININE 1.02 0.91 0.84  CALCIUM 9.2 8.8 8.6   Liver Function Tests:  Recent Labs Lab 07/01/14 0632  AST 17  ALT 7  ALKPHOS 63  BILITOT 0.5  PROT 6.5  ALBUMIN 3.2*   No results found for this  basename: LIPASE, AMYLASE,  in the last 168 hours No results found for this basename: AMMONIA,  in the last 168 hours CBC:  Recent Labs Lab 06/30/14 1807 07/01/14 0632 07/02/14 0435  WBC 8.4 9.7 10.3  NEUTROABS 5.8 6.9  --   HGB 10.6* 9.8* 9.1*  HCT 32.7* 29.3* 27.1*  MCV 91.1 89.6 89.4  PLT 241 203 203   Cardiac Enzymes:  Recent Labs Lab 06/30/14 1807  TROPONINI <0.30   BNP (last 3 results) No results found for this basename: PROBNP,  in the last 8760 hours CBG: No results found for this basename: GLUCAP,  in the last 168 hours  Recent Results (from the past 240 hour(s))  SURGICAL PCR SCREEN     Status: None   Collection Time    06/30/14 10:56 PM      Result Value Ref Range Status   MRSA, PCR NEGATIVE  NEGATIVE Final   Staphylococcus aureus NEGATIVE  NEGATIVE Final   Comment:            The Xpert SA Assay (FDA     approved for NASAL specimens     in patients over 95 years of age),     is one component of     a comprehensive surveillance     program.  Test performance has     been validated by [employer] for patients greater     than or equal to 80 year old.     It is not intended     to diagnose infection nor to     guide or monitor treatment.     Studies: Dg Chest 1 View  06/30/2014   CLINICAL DATA:  Pain post trauma  EXAM: CHEST - 1 VIEW  COMPARISON:  Chest CT May 24, 2014  FINDINGS: Mass in the left upper lobe is again noted measuring approximately 2.3 x 1.8 cm. Elsewhere, lungs are clear. Heart is upper normal in size with pulmonary vascularity within normal limits. No adenopathy. No pneumothorax. No fractures apparent.  IMPRESSION: Right upper lobe mass again noted, suspicious for neoplasm. Elsewhere lungs clear. No pneumothorax apparent.   Electronically Signed   By: Bretta Bang M.D.   On: 06/30/2014 19:02   Dg Hip Complete Left  06/30/2014   CLINICAL DATA:  Fall.  Left leg deformity and pain.  EXAM: LEFT HIP - COMPLETE 2+ VIEW  COMPARISON:   08/02/2013.  FINDINGS: Frontal pelvis with AP and frog-leg lateral views of the left hip show a left intertrochanteric proximal femur fracture with varus angulation. No evidence for pubic ramus fracture. SI joints and symphysis pubis are unremarkable.  IMPRESSION: Intertrochanteric left femur fracture with varus angulation.   Electronically Signed   By: Kennith Center M.D.   On: 06/30/2014 19:03   Dg Ankle Complete Right  06/30/2014   CLINICAL DATA:  Bruising post trauma  EXAM: RIGHT ANKLE - COMPLETE 3+ VIEW  COMPARISON:  None.  FINDINGS: Frontal, oblique, and lateral views were obtained. There is a small avulsion arising from the lateral malleolus. There is a second small avulsion arising from the dorsal distal talus. There is no appreciable joint effusion. There is mild generalized soft tissue swelling. The ankle  mortise appears intact. There is a spur arising from the inferior calcaneus.  IMPRESSION: Small lateral malleolar avulsion. There is an avulsion also arising from the dorsal distal talus. Ankle mortise appears intact.   Electronically Signed   By: Bretta Bang M.D.   On: 06/30/2014 19:04   Ct Cervical Spine Wo Contrast  06/30/2014   CLINICAL DATA:  Larey Seat.  Neck pain.  EXAM: CT CERVICAL SPINE WITHOUT CONTRAST  TECHNIQUE: Multidetector CT imaging of the cervical spine was performed without intravenous contrast. Multiplanar CT image reconstructions were also generated.  COMPARISON:  None.  FINDINGS: Advanced degenerative cervical spondylosis with multilevel disc disease and facet disease. The overall alignment is maintained. The spinal canal is fairly generous. There is a compression type fracture of the T1 vertebral body. There is also a laminar fracture on the left at C7.  No other definite fractures are identified. The C1-2 articulations are maintained. The dens is intact.  IMPRESSION: T1 compression fracture and left C7 laminar fracture.  Advanced degenerative cervical spondylosis with multilevel  disc disease and facet disease.   Electronically Signed   By: Loralie Champagne M.D.   On: 06/30/2014 21:44   Dg Foot Complete Right  06/30/2014   CLINICAL DATA:  Status post fall.  Bruising  EXAM: RIGHT FOOT COMPLETE - 3+ VIEW  COMPARISON:  None.  FINDINGS: The bones are osteopenic. There is mild degenerative changes at the first MTP joint. There is no evidence of arthropathy or other focal bone abnormality. Small plantar heel spur noted. Soft tissues are unremarkable. There is mild diffuse soft tissue swelling noted.  IMPRESSION: 1. Soft tissue swelling. 2. Mild chronic degenerative change.   Electronically Signed   By: Signa Kell M.D.   On: 06/30/2014 19:05    Scheduled Meds: . amLODipine  2.5 mg Oral Daily  . carvedilol  3.125 mg Oral BID WC  .  ceFAZolin (ANCEF) IV  1 g Intravenous Once  . feeding supplement (ENSURE COMPLETE)  237 mL Oral BID BM  . fentaNYL  12.5 mcg Transdermal Q72H  . ferrous sulfate  325 mg Oral Q breakfast  . gabapentin  100 mg Oral TID  . Influenza vac split quadrivalent PF  0.5 mL Intramuscular Tomorrow-1000  . levothyroxine  75 mcg Oral QAC breakfast  . pantoprazole  40 mg Oral Daily  . senna  1 tablet Oral BID   Continuous Infusions: . sodium chloride 50 mL/hr at 07/02/14 0800    Principal Problem:   Closed left hip fracture Active Problems:   Chronic kidney disease, stage II (mild)   Hypothyroidism   Unspecified essential hypertension   Nodule of left lung   Pulmonary hypertension   Anemia of chronic disease   GERD (gastroesophageal reflux disease)   Traumatic compression fracture of T1 thoracic vertebra   C7 cervical fracture   Lung mass; LUL    Time spent: 35 mins    [facility] MD [facility] Pager [phone]. If 7PM-7AM, please contact night-coverage at [email or web address], password [facility] 07/02/2014, 9:06 AM  LOS: 2 days

## 2014-07-02 NOTE — Progress Notes (Signed)
eLink Physician-Brief Progress Note Patient Name: Kim Hodges DOB: 1909/05/25 MRN: [id number]   Date of Service  07/02/2014  HPI/Events of Note  L femoral Fx s/p ORIF Minimal oxygen requirements Hemodynamically stable DNR  eICU Interventions  No interventions required     Intervention Category Evaluation Type: New Patient Evaluation  Kim Hodges 07/02/2014, 6:26 PM

## 2014-07-03 DIAGNOSIS — D62 Acute posthemorrhagic anemia: Secondary | ICD-10-CM | POA: Clinically undetermined

## 2014-07-03 DIAGNOSIS — D72829 Elevated white blood cell count, unspecified: Secondary | ICD-10-CM | POA: Diagnosis not present

## 2014-07-03 LAB — CBC
HEMATOCRIT: 24.7 % — AB (ref 36.0–46.0)
Hemoglobin: 8.3 g/dL — ABNORMAL LOW (ref 12.0–15.0)
MCH: 30.4 pg (ref 26.0–34.0)
MCHC: 33.6 g/dL (ref 30.0–36.0)
MCV: 90.5 fL (ref 78.0–100.0)
PLATELETS: 197 10*3/uL (ref 150–400)
RBC: 2.73 MIL/uL — ABNORMAL LOW (ref 3.87–5.11)
RDW: 14.6 % (ref 11.5–15.5)
WBC: 11.9 10*3/uL — AB (ref 4.0–10.5)

## 2014-07-03 LAB — BASIC METABOLIC PANEL
Anion gap: 13 (ref 5–15)
BUN: 19 mg/dL (ref 6–23)
CALCIUM: 8.2 mg/dL — AB (ref 8.4–10.5)
CHLORIDE: 99 meq/L (ref 96–112)
CO2: 22 mEq/L (ref 19–32)
CREATININE: 0.75 mg/dL (ref 0.50–1.10)
GFR calc non Af Amer: 66 mL/min — ABNORMAL LOW (ref >90)
GFR, EST AFRICAN AMERICAN: 76 mL/min — AB (ref >90)
Glucose, Bld: 121 mg/dL — ABNORMAL HIGH (ref 70–99)
Potassium: 3.8 mEq/L (ref 3.7–5.3)
Sodium: 134 mEq/L — ABNORMAL LOW (ref 137–147)

## 2014-07-03 NOTE — Progress Notes (Signed)
OT Cancellation Note  Patient Details Name: Kim Hodges MRN: [id number] DOB: [birth year]   Cancelled Treatment:    Reason Eval/Treat Not Completed: OT screened, no acute needs identified, will sign off (All OT needs can be met in next venue of care -- SNF)  Anjelina Dung A 07/03/2014, 11:57 AM

## 2014-07-03 NOTE — Progress Notes (Signed)
     Subjective: 1 Day Post-Op Procedure(s) (LRB): INTRAMEDULLARY (IM) NAIL FEMORAL (Left)   Patient does not express much pain. Very pleasant, but some responses to questions were odd.  She continually states that she feels that she is getting better.   Objective:   VITALS:   Filed Vitals:   07/03/14 1441  BP: 150/42  Pulse: 90  Temp: 99.7 F (37.6 C)  Resp: 14    Dorsiflexion/Plantar flexion intact Incision: dressing C/D/I No cellulitis present Compartment soft  LABS  Recent Labs  07/01/14 0632 07/02/14 0435 07/03/14 0345  HGB 9.8* 9.1* 8.3*  HCT 29.3* 27.1* 24.7*  WBC 9.7 10.3 11.9*  PLT 203 203 197     Recent Labs  07/01/14 0632 07/02/14 0435 07/03/14 0345  NA 132* 135* 134*  K 4.3 3.7 3.8  BUN CREATININE 0.91 0.84 0.75  GLUCOSE 112* 102* 121*     Assessment/Plan: 1 Day Post-Op Procedure(s) (LRB): INTRAMEDULLARY (IM) NAIL FEMORAL (Left) Up with therapy Discharge to SNF eventually, when ready Maintain dressing until tomorrow, then change out with gauze and tape.     Anastasio Auerbach Rooney Swails   PAC  07/03/2014, 3:53 PM

## 2014-07-03 NOTE — Progress Notes (Signed)
CSW met with pt / daughter in-law to assist with d/c planning. Family has been informed by [facility] that they will have a SNF bed available for pt when stable for d/c. CSW will continue to follow to assist with d/c planning back to [facility].   Werner Lean LCSW [phone]

## 2014-07-03 NOTE — Evaluation (Signed)
Physical Therapy Evaluation Patient Details Name: Kim Hodges MRN: [id number] DOB: [birth year] Today's Date: 07/03/2014   History of Present Illness  80 yo female adm 06/30/14 after mechanical fall resulting in left hip fx; Pt also  had fall ~3wks ago and C-spine xray this adm revealed C7 fx; Pt underwent IM nail on 07/02/14 per Dr. Lequita Halt.  PMHx: L1 fx, HTN, CKD  Clinical Impression  Pt will benefit from PT to address deficits below; Plan is for SNF at [facility]; Pt very pleasant and cooperative today with PT but limited by pain; Will follow          Follow Up Recommendations SNF    Equipment Recommendations  None recommended by PT    Recommendations for Other Services       Precautions / Restrictions Precautions Precautions: Cervical;Fall Required Braces or Orthoses: Cervical Brace Cervical Brace: Hard collar Restrictions LLE Weight Bearing: Weight bearing as tolerated      Mobility  Bed Mobility Overal bed mobility: Needs Assistance Bed Mobility: Supine to Sit     Supine to sit: +2 for physical assistance;Max assist     General bed mobility comments: +2 for lateral scooting (bed pad used), assist with trunk and LEs  Transfers Overall transfer level: Needs assistance Equipment used: None Transfers: Stand Pivot Transfers   Stand pivot transfers: +2 physical assistance;Total assist       General transfer comment: cues for technique, pt unable to self assist today d/t pain  Ambulation/Gait                Stairs            Wheelchair Mobility    Modified Rankin (Stroke Patients Only)       Balance Overall balance assessment: Needs assistance;History of Falls Sitting-balance support: Feet supported;Single extremity supported Sitting balance-Leahy Scale: Poor   Postural control: Right lateral lean   Standing balance-Leahy Scale: Zero                               Pertinent Vitals/Pain Pain Assessment: 0-10 Pain  Score: 5  Pain Intervention(s): Limited activity within patient's tolerance    Home Living Family/patient expects to be discharged to:: Skilled nursing facility                 Additional Comments: pt is from ALF at [facility]    Prior Function Level of Independence: Independent with assistive device(s)               Hand Dominance        Extremity/Trunk Assessment   Upper Extremity Assessment: Generalized weakness           Lower Extremity Assessment: LLE deficits/detail         Communication   Communication: No difficulties  Cognition Arousal/Alertness: Awake/alert Behavior During Therapy: WFL for tasks assessed/performed Overall Cognitive Status: Within Functional Limits for tasks assessed       Memory: Decreased short-term memory              General Comments      Exercises General Exercises - Lower Extremity Ankle Circles/Pumps: AROM;Both;10 reps      Assessment/Plan    PT Assessment Patient needs continued PT services  PT Diagnosis Difficulty walking;Generalized weakness   PT Problem List Decreased activity tolerance;Decreased balance;Decreased mobility;Decreased range of motion;Decreased strength;Decreased knowledge of use of DME  PT Treatment Interventions DME instruction;Gait training;Functional mobility training;Therapeutic activities;Therapeutic  exercise;Patient/family education   PT Goals (Current goals can be found in the Care Plan section) Acute Rehab PT Goals Patient Stated Goal: to walk, get stronger PT Goal Formulation: With patient Time For Goal Achievement: 07/10/14 Potential to Achieve Goals: Good    Frequency Min 3X/week   Barriers to discharge        Co-evaluation               End of Session Equipment Utilized During Treatment: Gait belt Activity Tolerance: Patient limited by fatigue;Patient limited by pain Patient left: in chair;with call bell/phone within reach;with family/visitor present Nurse  Communication: Mobility status         Time: 1478-2956 PT Time Calculation (min): 27 min   Charges:   PT Evaluation $Initial PT Evaluation Tier I: 1 Procedure PT Treatments $Therapeutic Activity: 23-37 mins   PT G Codes:          Kim Hodges 07/28/2014, 2:07 PM

## 2014-07-03 NOTE — Progress Notes (Signed)
[facility] PROGRESS NOTE  Kim Hodges FAO:[id number] DOB: 24-Dec-1908 DOA: 06/30/2014 PCP: [name], MD  Assessment/Plan: #1 left intertrochanteric femur fracture Secondary to mechanical fall. Patient has been seen by orthopedics and due to some concerns for possible abnormalities on prior echo repeat 2-D echo has been ordered. Patient denies any chest pain or shortness of breath. Patient denies any cardiac history. Patient had cardiac catheterization in 2012 with normal coronaries.patient had a 2-D echo in September of 2005 with EF of 55-65% with no wall motion abnormalities. Mild aortic valve thickness, mild to moderate mitral annular calcification consistent with mild mitral stenosis left atrium was mildly dilated. Repeat 2-D echo with EF of 60-65%, mild aortic valve stenosis, severe pulmonary hypertension, grade 2 diastolic dysfunction. Patient is currently asymptomatic. Continue Coreg and low-dose Norvasc. Patient is moderate to high risk based on age. 2-D echo results is not prohibitive for surgery. Patient s/p IM nail left intertrochanteric femur 07/02/14. Per orthopedics.  #2 T1 compression fracture of C7 lamina fracture A CT of the C-spine.. Patient denies any numbness and tingling in the upper extremities and on the complain of some mild neck discomfort.  Patient is moving her neck. Due to probable surgical intervention needed at patient's left femur fracture orthopedics recommended neurosurgical evaluation for further input. Patient with aspen collar. Ortho spine has seen patient and recommending to continue Aspen collar with outpatient follow up in 4 weeks.   #3 history of lung mass Patient with a prior history of a pulmonary mass suspicious for malignancy however family decided not to pursue any further workup at this time. Patient is not hypoxic. Patient is not tachypneic. Continue pulmonary toilet.  #4 severe pulmonary hypertension/moderate aortic stenosis Per 2-D echo.  Patient currently asymptomatic. Continue beta blocker and low-dose Norvasc. Outpatient followup.  #5 postherpetic neuralgia Continue home pain regimen of fentanyl patch and gabapentin.  #6 hypothyroidism Continue home dose Synthroid.  #7 hypertension Continue low-dose Coreg and norvasc, monitor BP.  #8 gastroesophageal reflux disease PPI.  #9 anemia/ABLA Likely secondary to anemia of chronic disease and ABLA postop. No overt bleeding. Follow.  #10 Leukocytosis Likely reactive following surgery. Follow. If WBC trends up with get CXR and repeat UA. No antibiotics at this time.  #11 prophylaxis Protonix for GI prophylaxis. DVT prophylaxis per orthopedics.  Code Status: DO NOT RESUSCITATE Family Communication: updated patient, no family at bedside. Disposition Plan:  Transfer to [facility]. Probable SNF when ready for discharge.   Consultants:  Orthopedics: Dr Lequita Halt 009/06/2014  Orthopedics/Spine: Dr Shon Baton 07/02/14  IM nail L intertrochanteric femur 07/02/14  Procedures:  CT C-spine 06/30/2014  X-ray of the left hip 06/30/2014  X-ray of the right foot 06/30/2014  Chest x-ray 06/30/2001  X-ray of the right ankle 06/30/2014  2 d echo 07/01/14  Antibiotics:  None  HPI/Subjective: Patient sleeping, however easily arousable. No complaints.  Objective: Filed Vitals:   07/03/14 0600  BP: 151/40  Pulse: 79  Temp:   Resp: 13    Intake/Output Summary (Last 24 hours) at 07/03/14 0820 Last data filed at 07/03/14 0600  Gross per 24 hour  Intake   3000 ml  Output   1710 ml  Net   1290 ml   Filed Weights   06/30/14 2230  Weight: 51.4 kg (113 lb 5.1 oz)    Exam:   General:  NAD. Aspen collar on.  Cardiovascular: RRR  Respiratory: CTAB anterior lung fields  Abdomen: Soft/NT/ND/+BS  Musculoskeletal: No c/c/e.  Data Reviewed: Basic Metabolic  Panel:  Recent Labs Lab 06/30/14 1807 07/01/14 0632 07/02/14 0435 07/03/14 0345  NA 131* 132* 135* 134*   K 4.7 4.3 3.7 3.8  CL 95* 96 98 99  CO2 GLUCOSE 118* 112* 102* 121*  BUN 27* CREATININE 1.02 0.91 0.84 0.75  CALCIUM 9.2 8.8 8.6 8.2*   Liver Function Tests:  Recent Labs Lab 07/01/14 0632  AST 17  ALT 7  ALKPHOS 63  BILITOT 0.5  PROT 6.5  ALBUMIN 3.2*   No results found for this basename: LIPASE, AMYLASE,  in the last 168 hours No results found for this basename: AMMONIA,  in the last 168 hours CBC:  Recent Labs Lab 06/30/14 1807 07/01/14 0632 07/02/14 0435 07/03/14 0345  WBC 8.4 9.7 10.3 11.9*  NEUTROABS 5.8 6.9  --   --   HGB 10.6* 9.8* 9.1* 8.3*  HCT 32.7* 29.3* 27.1* 24.7*  MCV 91.1 89.6 89.4 90.5  PLT 241 203 203 197   Cardiac Enzymes:  Recent Labs Lab 06/30/14 1807  TROPONINI <0.30   BNP (last 3 results) No results found for this basename: PROBNP,  in the last 8760 hours CBG: No results found for this basename: GLUCAP,  in the last 168 hours  Recent Results (from the past 240 hour(s))  SURGICAL PCR SCREEN     Status: None   Collection Time    06/30/14 10:56 PM      Result Value Ref Range Status   MRSA, PCR NEGATIVE  NEGATIVE Final   Staphylococcus aureus NEGATIVE  NEGATIVE Final   Comment:            The Xpert SA Assay (FDA     approved for NASAL specimens     in patients over 33 years of age),     is one component of     a comprehensive surveillance     program.  Test performance has     been validated by [employer] for patients greater     than or equal to 50 year old.     It is not intended     to diagnose infection nor to     guide or monitor treatment.     Studies: Dg Hip Operative Left  07/02/2014   CLINICAL DATA:  Intra medullary nail placement for proximal femur fracture  EXAM: OPERATIVE LEFT HIP -two views  COMPARISON:  Preoperative evaluation pelvis and left hip June 30, 2014  FINDINGS: Frontal and lateral views were obtained. There is screw and nail fixation through an intertrochanteric femur  fracture on the left. Alignment is near anatomic. The tip of the screws in the proximal femoral head. No dislocation.  IMPRESSION: Alignment near anatomic at the fracture site.  No dislocation.   Electronically Signed   By: Bretta Bang M.D.   On: 07/02/2014 13:52   Dg Pelvis Portable  07/02/2014   CLINICAL DATA:  Postop ORIF of left proximal femur.  EXAM: PORTABLE PELVIS 1-2 VIEWS  COMPARISON:  Intraoperative fluoroscopic views 07/02/2014 pelvis and left hip radiographs 06/30/2014  FINDINGS: There are postoperative changes of intramedullary nail placement for a left intertrochanteric femur fracture. Fracture fragments are in near-anatomic alignment. No hardware complication is identified. No new fractures seen. Expected locules of gas are seen in the soft tissues about the proximal left femur.  IMPRESSION: Status post ORIF for left proximal femur fracture. No unexpected or acute findings.   Electronically Signed  By: Britta Mccreedy M.D.   On: 07/02/2014 14:58    Scheduled Meds: . amLODipine  2.5 mg Oral Daily  . carvedilol  3.125 mg Oral BID WC  . docusate sodium  100 mg Oral BID  . enoxaparin (LOVENOX) injection  30 mg Subcutaneous Q24H  . feeding supplement (ENSURE COMPLETE)  237 mL Oral BID BM  . fentaNYL  12.5 mcg Transdermal Q72H  . ferrous sulfate  325 mg Oral Q breakfast  . gabapentin  100 mg Oral TID  . levothyroxine  75 mcg Oral QAC breakfast  . pantoprazole  40 mg Oral Daily  . senna  1 tablet Oral BID   Continuous Infusions: . sodium chloride 50 mL/hr at 07/03/14 0600    Principal Problem:   Closed fracture of intertrochanteric section of femur Active Problems:   Chronic kidney disease, stage II (mild)   Hypothyroidism   Unspecified essential hypertension   Nodule of left lung   Pulmonary hypertension   Anemia of chronic disease   GERD (gastroesophageal reflux disease)   Closed left hip fracture   Traumatic compression fracture of T1 thoracic vertebra   C7 cervical  fracture   Lung mass; LUL   Acute blood loss anemia   Leukocytosis, unspecified    Time spent: 35 mins    [facility] MD [facility] Pager [phone]. If 7PM-7AM, please contact night-coverage at [email or web address], password [facility] 07/03/2014, 8:20 AM  LOS: 3 days

## 2014-07-04 ENCOUNTER — Inpatient Hospital Stay (HOSPITAL_COMMUNITY): Payer: Medicare PPO

## 2014-07-04 DIAGNOSIS — J189 Pneumonia, unspecified organism: Secondary | ICD-10-CM | POA: Diagnosis not present

## 2014-07-04 LAB — CBC
HCT: 21.3 % — ABNORMAL LOW (ref 36.0–46.0)
HCT: 33.3 % — ABNORMAL LOW (ref 36.0–46.0)
Hemoglobin: 7.1 g/dL — ABNORMAL LOW (ref 12.0–15.0)
Hemoglobin: 11 g/dL — ABNORMAL LOW (ref 12.0–15.0)
MCH: 29.7 pg (ref 26.0–34.0)
MCH: 29.5 pg (ref 26.0–34.0)
MCHC: 33.3 g/dL (ref 30.0–36.0)
MCHC: 33 g/dL (ref 30.0–36.0)
MCV: 89.1 fL (ref 78.0–100.0)
MCV: 89.3 fL (ref 78.0–100.0)
Platelets: 196 10*3/uL (ref 150–400)
Platelets: 189 10*3/uL (ref 150–400)
RBC: 2.39 MIL/uL — ABNORMAL LOW (ref 3.87–5.11)
RBC: 3.73 MIL/uL — AB (ref 3.87–5.11)
RDW: 14.6 % (ref 11.5–15.5)
RDW: 14.4 % (ref 11.5–15.5)
WBC: 12.8 10*3/uL — ABNORMAL HIGH (ref 4.0–10.5)
WBC: 12 10*3/uL — AB (ref 4.0–10.5)

## 2014-07-04 LAB — URINALYSIS, ROUTINE W REFLEX MICROSCOPIC
Bilirubin Urine: NEGATIVE
Glucose, UA: NEGATIVE mg/dL
Hgb urine dipstick: NEGATIVE
Ketones, ur: NEGATIVE mg/dL
LEUKOCYTES UA: NEGATIVE
NITRITE: NEGATIVE
Protein, ur: 30 mg/dL — AB
SPECIFIC GRAVITY, URINE: 1.021 (ref 1.005–1.030)
UROBILINOGEN UA: 0.2 mg/dL (ref 0.0–1.0)
pH: 5 (ref 5.0–8.0)

## 2014-07-04 LAB — URINE MICROSCOPIC-ADD ON

## 2014-07-04 LAB — BASIC METABOLIC PANEL
Anion gap: 13 (ref 5–15)
BUN: 25 mg/dL — AB (ref 6–23)
CALCIUM: 8 mg/dL — AB (ref 8.4–10.5)
CO2: 21 mEq/L (ref 19–32)
Chloride: 98 mEq/L (ref 96–112)
Creatinine, Ser: 0.9 mg/dL (ref 0.50–1.10)
GFR calc Af Amer: 58 mL/min — ABNORMAL LOW (ref >90)
GFR calc non Af Amer: 50 mL/min — ABNORMAL LOW (ref >90)
Glucose, Bld: 117 mg/dL — ABNORMAL HIGH (ref 70–99)
Potassium: 3.8 mEq/L (ref 3.7–5.3)
SODIUM: 132 meq/L — AB (ref 137–147)

## 2014-07-04 LAB — PREPARE RBC (CROSSMATCH)

## 2014-07-04 MED ORDER — SODIUM CHLORIDE 0.9 % IV SOLN
Freq: Once | INTRAVENOUS | Status: AC
  Administered 2014-07-04: 08:00:00 via INTRAVENOUS

## 2014-07-04 MED ORDER — PIPERACILLIN-TAZOBACTAM IN DEX 2-0.25 GM/50ML IV SOLN
2.2500 g | Freq: Three times a day (TID) | INTRAVENOUS | Status: DC
  Administered 2014-07-04 – 2014-07-06 (×7): 2.25 g via INTRAVENOUS
  Filled 2014-07-04 (×8): qty 50

## 2014-07-04 MED ORDER — FUROSEMIDE 10 MG/ML IJ SOLN
20.0000 mg | Freq: Once | INTRAMUSCULAR | Status: AC
  Administered 2014-07-04: 20 mg via INTRAVENOUS
  Filled 2014-07-04: qty 2

## 2014-07-04 MED ORDER — DIPHENHYDRAMINE HCL 25 MG PO CAPS
25.0000 mg | ORAL_CAPSULE | Freq: Once | ORAL | Status: AC
  Administered 2014-07-04: 25 mg via ORAL
  Filled 2014-07-04: qty 1

## 2014-07-04 MED ORDER — ACETAMINOPHEN 325 MG PO TABS
650.0000 mg | ORAL_TABLET | Freq: Once | ORAL | Status: AC
  Administered 2014-07-04: 650 mg via ORAL
  Filled 2014-07-04: qty 2

## 2014-07-04 NOTE — Progress Notes (Signed)
Subjective: Doing well.  Pain controlled.     Objective: Vital signs in last 24 hours: Temp:  [99.2 F (37.3 C)-99.7 F (37.6 C)] 99.2 F (37.3 C) (09/13 0401) Pulse Rate:  [78-91] 80 (09/13 0401) Resp:  [13-16] 16 (09/13 0401) BP: (128-155)/(29-42) 138/40 mmHg (09/13 0401) SpO2:  [93 %-100 %] 95 % (09/13 0401)  Intake/Output from previous day: 09/12 0701 - 09/13 0700 In: 1860 [P.O.:660; I.V.:1200] Out: 690 [Urine:690] Intake/Output this shift: Total I/O In: -  Out: 200 [Urine:200]   Recent Labs  07/02/14 0435 07/03/14 0345 07/04/14 0445  HGB 9.1* 8.3* 7.1*    Recent Labs  07/03/14 0345 07/04/14 0445  WBC 11.9* 12.8*  RBC 2.73* 2.39*  HCT 24.7* 21.3*  PLT 197 196    Recent Labs  07/03/14 0345 07/04/14 0445  NA 134* 132*  K 3.8 3.8  CL 99 98  CO2 22 21  BUN 19 25*  CREATININE 0.75 0.90  GLUCOSE 121* 117*  CALCIUM 8.2* 8.0*    Recent Labs  07/02/14 0435  INR 1.20    Exam:  Extremely pleasant elderly WF, alert and oriented.  Wounds look good.  No drainage or signs of infection.  Calf nontender,  NVI.    Assessment/Plan: Continue present care.  Getting 2 units PRBC's today for ABLA.  Dr Lequita Halt will follow tomorrow.     Kim Hodges M 07/04/2014, 9:36 AM

## 2014-07-04 NOTE — Progress Notes (Signed)
[facility] PROGRESS NOTE  Kim Hodges ZOX:[id number] DOB: Aug 13, 1909 DOA: 06/30/2014 PCP: [name], MD  Assessment/Plan: #1 left intertrochanteric femur fracture Secondary to mechanical fall. Patient has been seen by orthopedics and due to some concerns for possible abnormalities on prior echo repeat 2-D echo has been ordered. Patient denies any chest pain or shortness of breath. Patient denies any cardiac history. Patient had cardiac catheterization in 2012 with normal coronaries.patient had a 2-D echo in September of 2005 with EF of 55-65% with no wall motion abnormalities. Mild aortic valve thickness, mild to moderate mitral annular calcification consistent with mild mitral stenosis left atrium was mildly dilated. Repeat 2-D echo with EF of 60-65%, mild aortic valve stenosis, severe pulmonary hypertension, grade 2 diastolic dysfunction. Patient is currently asymptomatic. Continue Coreg and low-dose Norvasc. Patient is moderate to high risk based on age. 2-D echo results is not prohibitive for surgery. Patient s/p IM nail left intertrochanteric femur 07/02/14. Per orthopedics.  #2 T1 compression fracture of C7 lamina fracture A CT of the C-spine.. Patient denies any numbness and tingling in the upper extremities and on the complain of some mild neck discomfort.  Patient is moving her neck. Due to probable surgical intervention needed at patient's left femur fracture orthopedics recommended neurosurgical evaluation for further input. Patient with aspen collar. Ortho spine has seen patient and recommending to continue Aspen collar with outpatient follow up in 4 weeks.   #3 healthcare associated pneumonia versus aspiration pneumonia Patient noted to have a leukocytosis that was worsening over the past one to 2 days. Chest x-ray was ordered this morning which came back with multifocal ill-defined opacities developing in the right mid to lower lung concerning for developing bronchopneumonia.  Patient currently asymptomatic however patient status post recent surgery and possible concern for aspiration pneumonia. Will check a urine Legionella antigen. Check a urine pneumococcus antigen. Patient is currently afebrile however WBC is rising. We'll place empirically on IV Zosyn. SLP. Follow.  #4 history of lung mass Patient with a prior history of a pulmonary mass suspicious for malignancy however family decided not to pursue any further workup at this time. Patient is not hypoxic. Patient is not tachypneic. Continue pulmonary toilet.  #5 severe pulmonary hypertension/moderate aortic stenosis Per 2-D echo. Patient currently asymptomatic. Continue beta blocker.  Outpatient followup.  #6 postherpetic neuralgia Continue home pain regimen of fentanyl patch and gabapentin.  #7 hypothyroidism Continue home dose Synthroid.  #8 hypertension Continue low-dose Coreg. D/C Norvasc.  #9 gastroesophageal reflux disease PPI.  #10 anemia/ABLA Likely secondary to anemia of chronic disease and ABLA postop. No overt bleeding. Patient's hemoglobin currently at 7.1 from 10.6 on admission. Will transfuse 2 units of packed red blood cells. Follow H&H. Follow.  #11 Leukocytosis Likely reactive following surgery. WBC is trending up. Chest x-ray was ordered this morning with concerns for developing right mid and lower lobe bronchopneumonia. Urinalysis is negative. Check a urine Legionella antigen. Check a urine pneumococcus antigen. Will place empirically on IV Zosyn.   #12 prophylaxis Protonix for GI prophylaxis. DVT prophylaxis per orthopedics.  Code Status: DO NOT RESUSCITATE Family Communication: updated patient, no family at bedside. Disposition Plan:  Probable SNF when ready for discharge.   Consultants:  Orthopedics: Dr Lequita Halt 009/06/2014  Orthopedics/Spine: Dr Shon Baton 07/02/14  IM nail L intertrochanteric femur 07/02/14  Procedures:  CT C-spine 06/30/2014  X-ray of the left hip  06/30/2014  X-ray of the right foot 06/30/2014  Chest x-ray 06/30/2001, 07/04/2014  X-ray of the  right ankle 06/30/2014  2 d echo 07/01/14  Antibiotics:  IV Zosyn 07/04/2014  HPI/Subjective: Patient sleeping, however easily arousable. No complaints.  Objective: Filed Vitals:   07/04/14 0401  BP: 138/40  Pulse: 80  Temp: 99.2 F (37.3 C)  Resp: 16    Intake/Output Summary (Last 24 hours) at 07/04/14 0915 Last data filed at 07/04/14 0851  Gross per 24 hour  Intake   1760 ml  Output    890 ml  Net    870 ml   Filed Weights   06/30/14 2230  Weight: 51.4 kg (113 lb 5.1 oz)    Exam:   General:  NAD. Aspen collar on.  Cardiovascular: RRR with 2-3/6 SEM  Respiratory: Coarse diffuse BS, some crackles.  Abdomen: Soft/NT/ND/+BS  Musculoskeletal: No c/c/e.  Data Reviewed: Basic Metabolic Panel:  Recent Labs Lab 06/30/14 1807 07/01/14 0632 07/02/14 0435 07/03/14 0345 07/04/14 0445  NA 131* 132* 135* 134* 132*  K 4.7 4.3 3.7 3.8 3.8  CL 95* 96 98 99 98  CO2 GLUCOSE 118* 112* 102* 121* 117*  BUN 27* 25*  CREATININE 1.02 0.91 0.84 0.75 0.90  CALCIUM 9.2 8.8 8.6 8.2* 8.0*   Liver Function Tests:  Recent Labs Lab 07/01/14 0632  AST 17  ALT 7  ALKPHOS 63  BILITOT 0.5  PROT 6.5  ALBUMIN 3.2*   No results found for this basename: LIPASE, AMYLASE,  in the last 168 hours No results found for this basename: AMMONIA,  in the last 168 hours CBC:  Recent Labs Lab 06/30/14 1807 07/01/14 0632 07/02/14 0435 07/03/14 0345 07/04/14 0445  WBC 8.4 9.7 10.3 11.9* 12.8*  NEUTROABS 5.8 6.9  --   --   --   HGB 10.6* 9.8* 9.1* 8.3* 7.1*  HCT 32.7* 29.3* 27.1* 24.7* 21.3*  MCV 91.1 89.6 89.4 90.5 89.1  PLT 241 203 203 197 196   Cardiac Enzymes:  Recent Labs Lab 06/30/14 1807  TROPONINI <0.30   BNP (last 3 results) No results found for this basename: PROBNP,  in the last 8760 hours CBG: No results found for this basename:  GLUCAP,  in the last 168 hours  Recent Results (from the past 240 hour(s))  SURGICAL PCR SCREEN     Status: None   Collection Time    06/30/14 10:56 PM      Result Value Ref Range Status   MRSA, PCR NEGATIVE  NEGATIVE Final   Staphylococcus aureus NEGATIVE  NEGATIVE Final   Comment:            The Xpert SA Assay (FDA     approved for NASAL specimens     in patients over 5 years of age),     is one component of     a comprehensive surveillance     program.  Test performance has     been validated by [employer] for patients greater     than or equal to 37 year old.     It is not intended     to diagnose infection nor to     guide or monitor treatment.     Studies: Dg Hip Operative Left  07/02/2014   CLINICAL DATA:  Intra medullary nail placement for proximal femur fracture  EXAM: OPERATIVE LEFT HIP -two views  COMPARISON:  Preoperative evaluation pelvis and left hip June 30, 2014  FINDINGS: Frontal and lateral views were obtained. There  is screw and nail fixation through an intertrochanteric femur fracture on the left. Alignment is near anatomic. The tip of the screws in the proximal femoral head. No dislocation.  IMPRESSION: Alignment near anatomic at the fracture site.  No dislocation.   Electronically Signed   By: Bretta Bang M.D.   On: 07/02/2014 13:52   Dg Pelvis Portable  07/02/2014   CLINICAL DATA:  Postop ORIF of left proximal femur.  EXAM: PORTABLE PELVIS 1-2 VIEWS  COMPARISON:  Intraoperative fluoroscopic views 07/02/2014 pelvis and left hip radiographs 06/30/2014  FINDINGS: There are postoperative changes of intramedullary nail placement for a left intertrochanteric femur fracture. Fracture fragments are in near-anatomic alignment. No hardware complication is identified. No new fractures seen. Expected locules of gas are seen in the soft tissues about the proximal left femur.  IMPRESSION: Status post ORIF for left proximal femur fracture. No unexpected or acute  findings.   Electronically Signed   By: Britta Mccreedy M.D.   On: 07/02/2014 14:58   Dg Chest Port 1 View  07/04/2014   CLINICAL DATA:  Leukocytosis.  EXAM: PORTABLE CHEST - 1 VIEW  COMPARISON:  Chest x-ray 06/30/2014.  FINDINGS: Lung volumes are normal. Large left upper lobe mass again noted, better demonstrated on prior chest CT 05/24/2014. Mild diffuse interstitial prominence is again noted, but appears be chronic and is similar to prior examinations. Diffuse peribronchial cuffing, also similar to the prior examination. There is some patchy ill-defined opacities developing in the right mid to lower lung. No pleural effusions. No evidence of pulmonary edema. Mild cardiomegaly. The patient is rotated to the right on today's exam, resulting in distortion of the mediastinal contours and reduced diagnostic sensitivity and specificity for mediastinal pathology. Atherosclerosis in the thoracic aorta.  IMPRESSION: 1. Overall, the appearance the chest is very similar to recent prior examinations, with exception of some multifocal ill-defined opacities developing in the right mid to lower lung, concerning for developing bronchopneumonia. Close attention on followup studies is recommended. 2. Left upper lobe mass again noted, concerning for neoplasm.   Electronically Signed   By: Trudie Reed M.D.   On: 07/04/2014 09:00    Scheduled Meds: . sodium chloride   Intravenous Once  . acetaminophen  650 mg Oral Once  . carvedilol  3.125 mg Oral BID WC  . diphenhydrAMINE  25 mg Oral Once  . docusate sodium  100 mg Oral BID  . enoxaparin (LOVENOX) injection  30 mg Subcutaneous Q24H  . feeding supplement (ENSURE COMPLETE)  237 mL Oral BID BM  . fentaNYL  12.5 mcg Transdermal Q72H  . ferrous sulfate  325 mg Oral Q breakfast  . furosemide  20 mg Intravenous Once  . gabapentin  100 mg Oral TID  . levothyroxine  75 mcg Oral QAC breakfast  . pantoprazole  40 mg Oral Daily  . senna  1 tablet Oral BID   Continuous  Infusions: . sodium chloride 50 mL/hr at 07/03/14 1610    Principal Problem:   Closed fracture of intertrochanteric section of femur Active Problems:   Chronic kidney disease, stage II (mild)   Hypothyroidism   Unspecified essential hypertension   Nodule of left lung   Pulmonary hypertension   Anemia of chronic disease   GERD (gastroesophageal reflux disease)   Closed left hip fracture   Traumatic compression fracture of T1 thoracic vertebra   C7 cervical fracture   Lung mass; LUL   Acute blood loss anemia   Leukocytosis, unspecified   HCAP (  healthcare-associated pneumonia)    Time spent: 35 mins    [facility] MD [facility] Pager [phone]. If 7PM-7AM, please contact night-coverage at [email or web address], password [facility] 07/04/2014, 9:15 AM  LOS: 4 days

## 2014-07-04 NOTE — Progress Notes (Signed)
CARE MANAGEMENT NOTE 07/04/2014  Patient:  Kim Hodges, Kim Hodges   Account Number:  [id number]  Date Initiated:  07/02/2014  Documentation initiated by:  DAVIS,RHONDA  Subjective/Objective Assessment:   80 y.o. female sustained left closed hip fracture/patient admitted late in day./cardiac clearance required for orif due to age and general condition     Action/Plan:   from assisted living at [facility]/may require str in snf   Anticipated DC Date:  07/05/2014   Anticipated DC Plan:  SKILLED NURSING FACILITY  In-house referral  Clinical Social Worker      DC Planning Services  CM consult      [facility] Choice  NA   Choice offered to / List presented to:  NA   DME arranged  NA      DME agency  NA     HH arranged  NA      HH agency  NA   Status of service:  Completed, signed off Medicare Important Message given?  YES (If response is "NO", the following Medicare IM given date fields will be blank) Date Medicare IM given:  07/04/2014 Medicare IM given by:  [facility] Date Additional Medicare IM given:   Additional Medicare IM given by:    Discharge Disposition:  SKILLED NURSING FACILITY  Per UR Regulation:  Reviewed for med. necessity/level of care/duration of stay  If discussed at Long Length of Stay Meetings, dates discussed:    Comments:  [id number]/Rhonda Davis,RN,BSN,CCM

## 2014-07-04 NOTE — Progress Notes (Signed)
07/04/14 Nursing 0550 Freddie Breech PA called reg. Hemoglobin of 7.1 this am. No orders received. Pa will contact Dr. Victorino Dike for furhter orders if necessary

## 2014-07-04 NOTE — Progress Notes (Signed)
ANTIBIOTIC CONSULT NOTE - INITIAL  Pharmacy Consult for Zosyn Indication: Empiric post-op aspiration PNA  No Known Allergies  Patient Measurements: Height:  (154.9 cm) Weight: 113 lb 5.1 oz (51.4 kg) IBW/kg (Calculated) : 47.8 Adjusted Body Weight:   Vital Signs: Temp: 99.2 F (37.3 C) (09/13 0401) Temp src: Oral (09/13 0401) BP: 138/40 mmHg (09/13 0401) Pulse Rate: 80 (09/13 0401) Intake/Output from previous day: 09/12 0701 - 09/13 0700 In: 1860 [P.O.:660; I.V.:1200] Out: 690 [Urine:690] Intake/Output from this shift: Total I/O In: -  Out: 200 [Urine:200]  Labs:  Recent Labs  07/02/14 0435 07/03/14 0345 07/04/14 0445  WBC 10.3 11.9* 12.8*  HGB 9.1* 8.3* 7.1*  PLT 203 197 196  CREATININE 0.84 0.75 0.90   Estimated Creatinine Clearance: 23.2 ml/min (by C-G formula based on Cr of 0.9). No results found for this basename: VANCOTROUGH, Leodis Binet, VANCORANDOM, GENTTROUGH, GENTPEAK, GENTRANDOM, TOBRATROUGH, TOBRAPEAK, TOBRARND, AMIKACINPEAK, AMIKACINTROU, AMIKACIN,  in the last 72 hours   Microbiology: Recent Results (from the past 720 hour(s))  SURGICAL PCR SCREEN     Status: None   Collection Time    06/30/14 10:56 PM      Result Value Ref Range Status   MRSA, PCR NEGATIVE  NEGATIVE Final   Staphylococcus aureus NEGATIVE  NEGATIVE Final   Comment:            The Xpert SA Assay (FDA     approved for NASAL specimens     in patients over 22 years of age),     is one component of     a comprehensive surveillance     program.  Test performance has     been validated by [employer] for patients greater     than or equal to 53 year old.     It is not intended     to diagnose infection nor to     guide or monitor treatment.    Medical History: Past Medical History  Diagnosis Date  . Chronic kidney disease, stage II (mild) 12/25/2012  . Unspecified arthropathy, lower leg 09/11/2011  . Swelling of limb 09/10/2012  . Other and unspecified hyperlipidemia  07/26/2011  . Actinic keratosis 07/26/2011  . Abnormality of gait 07/26/2011  . Hyperglycemia 07/26/2011  . Tear film insufficiency, unspecified 12/29/2009  . Other malaise and fatigue 12/29/2009  . Unspecified hearing loss 06/30/2009  . Unspecified hypothyroidism 12/15/2008  . Unspecified essential hypertension 12/15/2008  . Unspecified glaucoma 12/15/2008    both eyes  . Weak 08/02/13  . Abnormality of gait 08/02/13  . Hyponatremia 08/02/13  . Herpes zoster 08/02/13  . Post herpetic neuralgia 08/02/13  . Cerebral atrophy 08/02/13  . Cerebrovascular disease 08/02/13  . Atherosclerosis 08/02/13  . Abnormal chest x-ray 08/02/13    08/02/13 nodule in left mid to upper lung  . Murmur, cardiac 08/02/13    aortic ejection  . Fracture lumbar vertebra-closed 08/02/13    L1  . Memory changes 07/03/13  . Lung mass 07/01/2014   Assessment: 89 yoF with left femoral fx s/p ORIF on 9/11, also PMHx cervical fx, CKD, severe pulmonary HTN, moderate AS, hypothyroidism, and postherpetic neuralgia.  Today pt with leukocytosis and CXR shows possible developing bronchopneumonia on right mid-lower lung, though pt is asymptomatic.  Pharmacy consulted to start empiric zosyn for possible aspiration PNA post-op.    9/13 >> zosyn  >>  Tmax: Afebrile WBCs: 12.8 Renal: SCr 0.90, CrCl ~23 CG (N35)  9/13 urine:  collected  Goal of Therapy:  Eradication of infection  Plan:  Zosyn 2.25g IV q8h - conservative dose 2/2 age and poor renal function.  F/u cultures, clinical course.   Haynes Hoehn, PharmD, BCPS 07/04/2014, 9:39 AM  Pager: [phone]

## 2014-07-04 NOTE — Progress Notes (Signed)
PT Cancellation Note  Patient Details Name: Kim Hodges MRN: [id number] DOB: [birth year]   Cancelled Treatment:    Reason Eval/Treat Not Completed: Fatigue/lethargy limiting ability to participate;Patient not medically ready; Pt starting second unit of blood, Hgb 7.1, symptomatic per RN; pt requesting to take a nap at this time as she  has had visitors most of day;   [facility] -  07/04/2014, 2:42 PM

## 2014-07-05 ENCOUNTER — Encounter (HOSPITAL_COMMUNITY): Payer: Self-pay | Admitting: Orthopedic Surgery

## 2014-07-05 LAB — TYPE AND SCREEN
ABO/RH(D): O POS
Antibody Screen: NEGATIVE
UNIT DIVISION: 0
Unit division: 0

## 2014-07-05 LAB — CBC
HCT: 30.7 % — ABNORMAL LOW (ref 36.0–46.0)
HEMOGLOBIN: 10.5 g/dL — AB (ref 12.0–15.0)
MCH: 30.3 pg (ref 26.0–34.0)
MCHC: 34.2 g/dL (ref 30.0–36.0)
MCV: 88.5 fL (ref 78.0–100.0)
Platelets: 205 10*3/uL (ref 150–400)
RBC: 3.47 MIL/uL — ABNORMAL LOW (ref 3.87–5.11)
RDW: 14.5 % (ref 11.5–15.5)
WBC: 12.7 10*3/uL — AB (ref 4.0–10.5)

## 2014-07-05 LAB — BASIC METABOLIC PANEL
Anion gap: 12 (ref 5–15)
BUN: 35 mg/dL — ABNORMAL HIGH (ref 6–23)
CO2: 24 mEq/L (ref 19–32)
Calcium: 8 mg/dL — ABNORMAL LOW (ref 8.4–10.5)
Chloride: 99 mEq/L (ref 96–112)
Creatinine, Ser: 1.15 mg/dL — ABNORMAL HIGH (ref 0.50–1.10)
GFR calc non Af Amer: 37 mL/min — ABNORMAL LOW (ref >90)
GFR, EST AFRICAN AMERICAN: 43 mL/min — AB (ref >90)
GLUCOSE: 121 mg/dL — AB (ref 70–99)
POTASSIUM: 4.3 meq/L (ref 3.7–5.3)
SODIUM: 135 meq/L — AB (ref 137–147)

## 2014-07-05 LAB — URINE CULTURE
COLONY COUNT: NO GROWTH
CULTURE: NO GROWTH

## 2014-07-05 LAB — LEGIONELLA ANTIGEN, URINE: LEGIONELLA ANTIGEN, URINE: NEGATIVE

## 2014-07-05 LAB — STREP PNEUMONIAE URINARY ANTIGEN: Strep Pneumo Urinary Antigen: NEGATIVE

## 2014-07-05 MED ORDER — ENOXAPARIN SODIUM 30 MG/0.3ML ~~LOC~~ SOLN: 30.0000 mg | SUBCUTANEOUS | Status: DC

## 2014-07-05 MED ORDER — TRAMADOL HCL 50 MG PO TABS: 50.0000 mg | ORAL_TABLET | Freq: Four times a day (QID) | ORAL | Status: DC | PRN

## 2014-07-05 NOTE — Discharge Instructions (Signed)
When discharged from the skilled rehab facility, please have the facility set up the patient's Home Health Physical Therapy prior to being released.  Also provide the patient with their medications at time of release from the facility to include their pain medication, the muscle relaxants, and their blood thinner medication.  If the patient is still at the rehab facility at time of follow up appointment, please also assist the patient in arranging follow up appointment in our office and any transportation needs.  Pick up stool softner and laxative for home. Do not submerge incision under water. May shower. Continue to use ice for pain and swelling from surgery.  Lovenox injections for a total of ten days (seven more days) and then switch over to a full dose 325 mg Aspirin daily for three weeks.  After the three weeks of 325 mg Aspirin, then resume the baby 81 mg Aspirin daily as taking prior to the hospitalization.

## 2014-07-05 NOTE — Progress Notes (Signed)
Physical Therapy Treatment Patient Details Name: Kim Hodges MRN: [id number] DOB: 10-30-1908 Today's Date: 07/05/2014    History of Present Illness 80 yo female adm 06/30/14 after mechanical fall resulting in left hip fx; Pt also  had fall ~3wks ago and C-spine xray this adm revealed C7 fx; Pt underwent IM nail on 07/02/14 per Dr. Lequita Halt.  PMHx: L1 fx, HTN, CKD    PT Comments     SLOWING IMPROVING IN MOBILITY. New cervical collar placed which  Pt reports feels much better.  Follow Up Recommendations  SNF     Equipment Recommendations       Recommendations for Other Services       Precautions / Restrictions Precautions Precautions: Cervical;Fall Required Braces or Orthoses: Cervical Brace Cervical Brace: Hard collar;At all times Restrictions LLE Weight Bearing: Weight bearing as tolerated    Mobility  Bed Mobility Overal bed mobility: Needs Assistance Bed Mobility: Sit to Supine     Supine to sit: +2 for physical assistance;Max assist Sit to supine: +2 for physical assistance;+2 for safety/equipment;Max assist   General bed mobility comments: legs and trunk onto bed.  Transfers Overall transfer level: Needs assistance Equipment used: Rolling walker (2 wheeled) Transfers: Sit to/from [employer] Sit to Stand: Max assist;+2 physical assistance;+2 safety/equipment Stand pivot transfers: +2 safety/equipment;+2 physical assistance;Max assist       General transfer comment:  +2 armhold to stand and pivot to [facility], then to bed. Increased weight on L leg, did stand and wiped self after toileting with 2 person support in standing.  Ambulation/Gait                 Stairs            Wheelchair Mobility    Modified Rankin (Stroke Patients Only)       Balance                                    Cognition Arousal/Alertness: Awake/alert Behavior During Therapy: WFL for tasks assessed/performed Overall Cognitive Status:  Within Functional Limits for tasks assessed                      Exercises      General Comments        Pertinent Vitals/Pain Pain Assessment: Faces Faces Pain Scale: Hurts little more Pain Location: L hip Pain Descriptors / Indicators: Discomfort Pain Intervention(s): Monitored during session;Repositioned    Home Living Family/patient expects to be discharged to:: Skilled nursing facility               Additional Comments: pt is from ALF at [facility]    Prior Function Level of Independence: Independent with assistive device(s)          PT Goals (current goals can now be found in the care plan section) Acute Rehab PT Goals Patient Stated Goal: to walk, get stronger Progress towards PT goals: Progressing toward goals    Frequency  Min 3X/week    PT Plan Current plan remains appropriate    Co-evaluation             End of Session   Activity Tolerance: Patient tolerated treatment well Patient left: in bed;with call bell/phone within reach     Time: 4098-1191 PT Time Calculation (min): 10 min  Charges:  $Therapeutic Activity: 8-22 mins  G Codes:      Rada Hay 07/05/2014, 6:48 PM

## 2014-07-05 NOTE — Progress Notes (Signed)
CSW assisting with d/c planning. Pt has [employer] which requires prior authorization for SNF. CSW has contacted [facility] to request initiation of authorization process. CSW will provide SNF PT notes, from today, when available. CSW will continue to follow to assist with d/c planning to [facility].  Cori Razor LCSW [phone]

## 2014-07-05 NOTE — Progress Notes (Signed)
Physical Therapy Treatment Patient Details Name: Kim Hodges MRN: [id number] DOB: 04-Oct-1909 Today's Date: 07/05/2014    History of Present Illness 80 yo female adm 06/30/14 after mechanical fall resulting in left hip fx; Pt also  had fall ~3wks ago and C-spine xray this adm revealed C7 fx; Pt underwent IM nail on 07/02/14 per Dr. Lequita Halt.  PMHx: L1 fx, HTN, CKD    PT Comments    Patient   Tolerated activity better today, did place more weight through L leg. Plans for SNF.  Follow Up Recommendations  SNF     Equipment Recommendations  None recommended by PT    Recommendations for Other Services       Precautions / Restrictions Precautions Precautions: Cervical;Fall Required Braces or Orthoses: Cervical Brace Cervical Brace: Hard collar;At all times Restrictions LLE Weight Bearing: Weight bearing as tolerated    Mobility  Bed Mobility Overal bed mobility: Needs Assistance Bed Mobility: Supine to Sit     Supine to sit: +2 for physical assistance;Max assist     General bed mobility comments: +2 forsitting up at the edge, bed pad used to turn  patient and both legs moved together.  Transfers Overall transfer level: Needs assistance Equipment used: Rolling walker (2 wheeled) Transfers: Sit to/from [employer] Sit to Stand: Max assist;+2 physical assistance;+2 safety/equipment Stand pivot transfers: +2 safety/equipment;+2 physical assistance;Max assist       General transfer comment:  +2 armhold to stand and pivot to [facility], use of RW to stand and pivot to recliner but pt really not able to use the RW today, still needed support. Did place more weight through LLE  Ambulation/Gait                 Stairs            Wheelchair Mobility    Modified Rankin (Stroke Patients Only)       Balance   Sitting-balance support: Bilateral upper extremity supported;Feet supported Sitting balance-Leahy Scale: Poor Sitting balance - Comments:  improved  balance at the edge of the bed.                            Cognition Arousal/Alertness: Awake/alert                          Exercises      General Comments        Pertinent Vitals/Pain Pain Assessment: Faces Faces Pain Scale: Hurts little more Pain Location: L hip but states it was not as bad as last time. Pain Descriptors / Indicators: Discomfort Pain Intervention(s): Monitored during session;Repositioned;Ice applied    Home Living                      Prior Function            PT Goals (current goals can now be found in the care plan section) Progress towards PT goals: Progressing toward goals    Frequency       PT Plan Current plan remains appropriate    Co-evaluation             End of Session Equipment Utilized During Treatment: Gait belt Activity Tolerance: Patient tolerated treatment well Patient left: in chair;with call bell/phone within reach;with family/visitor present     Time: 4098-1191 PT Time Calculation (min): 24 min  Charges:  $Therapeutic Activity: 23-37 mins  G Codes:      Rada Hay 07/05/2014, 2:08 PM Blanchard Kelch PT [phone]

## 2014-07-05 NOTE — Progress Notes (Signed)
[facility] PROGRESS NOTE  Kim Hodges ZHY:[id number] DOB: 12/19/1908 DOA: 06/30/2014 PCP: [name], MD  Assessment/Plan: #1 left intertrochanteric femur fracture Secondary to mechanical fall. Patient has been seen by orthopedics and due to some concerns for possible abnormalities on prior echo repeat 2-D echo has been ordered. Patient denies any chest pain or shortness of breath. Patient denies any cardiac history. Patient had cardiac catheterization in 2012 with normal coronaries.patient had a 2-D echo in September of 2005 with EF of 55-65% with no wall motion abnormalities. Mild aortic valve thickness, mild to moderate mitral annular calcification consistent with mild mitral stenosis left atrium was mildly dilated. Repeat 2-D echo with EF of 60-65%, mild aortic valve stenosis, severe pulmonary hypertension, grade 2 diastolic dysfunction. Patient is currently asymptomatic. Continue Coreg and low-dose Norvasc. Patient is moderate to high risk based on age. 2-D echo results is not prohibitive for surgery. Patient s/p IM nail left intertrochanteric femur 07/02/14. Per orthopedics.  #2 T1 compression fracture of C7 lamina fracture A CT of the C-spine.. Patient denies any numbness and tingling in the upper extremities and on the complain of some mild neck discomfort.  Patient is moving her neck. Due to probable surgical intervention needed at patient's left femur fracture orthopedics recommended neurosurgical evaluation for further input. Patient with aspen collar. Ortho spine has seen patient and recommending to continue Aspen collar with outpatient follow up in 4 weeks.   #3 healthcare associated pneumonia versus aspiration pneumonia Patient noted to have a leukocytosis that was worsening over the past one to 2 days. Chest x-ray which came back with multifocal ill-defined opacities developing in the right mid to lower lung concerning for developing bronchopneumonia. Patient currently  asymptomatic however patient status post recent surgery and possible concern for aspiration pneumonia. Urine Legionella antigen negative. Urine pneumococcus antigen negative. Patient is currently afebrile. Continue IV Zosyn. Follow.  #4 history of lung mass Patient with a prior history of a pulmonary mass suspicious for malignancy however family decided not to pursue any further workup at this time. Patient is not hypoxic. Patient is not tachypneic. Continue pulmonary toilet.  #5 severe pulmonary hypertension/moderate aortic stenosis Per 2-D echo. Patient currently asymptomatic. Continue beta blocker.  Outpatient followup.  #6 postherpetic neuralgia Continue home pain regimen of fentanyl patch and gabapentin.  #7 hypothyroidism Continue home dose Synthroid.  #8 hypertension Continue low-dose Coreg.   #9 gastroesophageal reflux disease PPI.  #10 anemia/ABLA Likely secondary to anemia of chronic disease and ABLA postop. No overt bleeding. Patient's s/p 2 units PRBCs. Hgb 10.5. Follow H&H. Follow.  #11 Leukocytosis Likely secondary to PNA per CXR. Chest x-ray with concerns for developing right mid and lower lobe bronchopneumonia. Urinalysis is negative. Urine Legionella antigen neg. Urine pneumococcus antigen negative. Continue IV Zosyn.  ]#12 prophylaxis Protonix for GI prophylaxis. DVT prophylaxis per orthopedics.  Code Status: DO NOT RESUSCITATE Family Communication: updated patient, no family at bedside. Disposition Plan:  SNF when ready for discharge.   Consultants:  Orthopedics: Dr Lequita Halt 009/06/2014  Orthopedics/Spine: Dr Shon Baton 07/02/14  IM nail L intertrochanteric femur 07/02/14  Procedures:  CT C-spine 06/30/2014  X-ray of the left hip 06/30/2014  X-ray of the right foot 06/30/2014  Chest x-ray 06/30/2001, 07/04/2014  X-ray of the right ankle 06/30/2014  2 d echo 07/01/14  2 units PRBC 07/04/14  Antibiotics:  IV Zosyn 07/04/2014  HPI/Subjective: No  complaints.  Objective: Filed Vitals:   07/05/14 0339  BP: 172/59  Pulse: 89  Temp: 99.2  F (37.3 C)  Resp: 16    Intake/Output Summary (Last 24 hours) at 07/05/14 1213 Last data filed at 07/05/14 0340  Gross per 24 hour  Intake   2726 ml  Output   1650 ml  Net   1076 ml   Filed Weights   06/30/14 2230  Weight: 51.4 kg (113 lb 5.1 oz)    Exam:   General:  NAD. Aspen collar on.  Cardiovascular: RRR with 2-3/6 SEM  Respiratory: Coarse diffuse BS, some crackles.  Abdomen: Soft/NT/ND/+BS  Musculoskeletal: No c/c/e.  Data Reviewed: Basic Metabolic Panel:  Recent Labs Lab 07/01/14 0632 07/02/14 0435 07/03/14 0345 07/04/14 0445 07/05/14 0445  NA 132* 135* 134* 132* 135*  K 4.3 3.7 3.8 3.8 4.3  CL 96 98 99 98 99  CO2 GLUCOSE 112* 102* 121* 117* 121*  BUN 25* 35*  CREATININE 0.91 0.84 0.75 0.90 1.15*  CALCIUM 8.8 8.6 8.2* 8.0* 8.0*   Liver Function Tests:  Recent Labs Lab 07/01/14 0632  AST 17  ALT 7  ALKPHOS 63  BILITOT 0.5  PROT 6.5  ALBUMIN 3.2*   No results found for this basename: LIPASE, AMYLASE,  in the last 168 hours No results found for this basename: AMMONIA,  in the last 168 hours CBC:  Recent Labs Lab 06/30/14 1807 07/01/14 0632 07/02/14 0435 07/03/14 0345 07/04/14 0445 07/04/14 2130 07/05/14 0445  WBC 8.4 9.7 10.3 11.9* 12.8* 12.0* 12.7*  NEUTROABS 5.8 6.9  --   --   --   --   --   HGB 10.6* 9.8* 9.1* 8.3* 7.1* 11.0* 10.5*  HCT 32.7* 29.3* 27.1* 24.7* 21.3* 33.3* 30.7*  MCV 91.1 89.6 89.4 90.5 89.1 89.3 88.5  PLT 241 203 203 197 196 189 205   Cardiac Enzymes:  Recent Labs Lab 06/30/14 1807  TROPONINI <0.30   BNP (last 3 results) No results found for this basename: PROBNP,  in the last 8760 hours CBG: No results found for this basename: GLUCAP,  in the last 168 hours  Recent Results (from the past 240 hour(s))  SURGICAL PCR SCREEN     Status: None   Collection Time    06/30/14 10:56 PM       Result Value Ref Range Status   MRSA, PCR NEGATIVE  NEGATIVE Final   Staphylococcus aureus NEGATIVE  NEGATIVE Final   Comment:            The Xpert SA Assay (FDA     approved for NASAL specimens     in patients over 45 years of age),     is one component of     a comprehensive surveillance     program.  Test performance has     been validated by [employer] for patients greater     than or equal to 25 year old.     It is not intended     to diagnose infection nor to     guide or monitor treatment.     Studies: Dg Chest Port 1 View  07/04/2014   CLINICAL DATA:  Leukocytosis.  EXAM: PORTABLE CHEST - 1 VIEW  COMPARISON:  Chest x-ray 06/30/2014.  FINDINGS: Lung volumes are normal. Large left upper lobe mass again noted, better demonstrated on prior chest CT 05/24/2014. Mild diffuse interstitial prominence is again noted, but appears be chronic and is similar to prior examinations. Diffuse peribronchial cuffing, also similar to  the prior examination. There is some patchy ill-defined opacities developing in the right mid to lower lung. No pleural effusions. No evidence of pulmonary edema. Mild cardiomegaly. The patient is rotated to the right on today's exam, resulting in distortion of the mediastinal contours and reduced diagnostic sensitivity and specificity for mediastinal pathology. Atherosclerosis in the thoracic aorta.  IMPRESSION: 1. Overall, the appearance the chest is very similar to recent prior examinations, with exception of some multifocal ill-defined opacities developing in the right mid to lower lung, concerning for developing bronchopneumonia. Close attention on followup studies is recommended. 2. Left upper lobe mass again noted, concerning for neoplasm.   Electronically Signed   By: Trudie Reed M.D.   On: 07/04/2014 09:00    Scheduled Meds: . carvedilol  3.125 mg Oral BID WC  . docusate sodium  100 mg Oral BID  . enoxaparin (LOVENOX) injection  30 mg Subcutaneous  Q24H  . feeding supplement (ENSURE COMPLETE)  237 mL Oral BID BM  . fentaNYL  12.5 mcg Transdermal Q72H  . ferrous sulfate  325 mg Oral Q breakfast  . gabapentin  100 mg Oral TID  . levothyroxine  75 mcg Oral QAC breakfast  . pantoprazole  40 mg Oral Daily  . piperacillin-tazobactam (ZOSYN)  IV  2.25 g Intravenous 3 times per day  . senna  1 tablet Oral BID   Continuous Infusions:    Principal Problem:   Closed fracture of intertrochanteric section of femur Active Problems:   Chronic kidney disease, stage II (mild)   Hypothyroidism   Unspecified essential hypertension   Nodule of left lung   Pulmonary hypertension   Anemia of chronic disease   GERD (gastroesophageal reflux disease)   Closed left hip fracture   Traumatic compression fracture of T1 thoracic vertebra   C7 cervical fracture   Lung mass; LUL   Acute blood loss anemia   Leukocytosis, unspecified   HCAP (healthcare-associated pneumonia)    Time spent: 35 mins    [facility] MD [facility] Pager [phone]. If 7PM-7AM, please contact night-coverage at [email or web address], password [facility] 07/05/2014, 12:13 PM  LOS: 5 days

## 2014-07-05 NOTE — Progress Notes (Signed)
Aspen collar continues to slide off pts chin into nose area.Ortho Tech stated no adjustment could be made in the size.Could we possibly have an order for a different ? Soft cervical collar or one that fits more properly? Thank you Doren Custard

## 2014-07-05 NOTE — Evaluation (Signed)
Clinical/Bedside Swallow Evaluation Patient Details  Name: Kim Hodges MRN: [id number] Date of Birth: 06/20/1909  Today's Date: 07/05/2014 Time: 1215-1239 SLP Time Calculation (min): 24 min  Past Medical History:  Past Medical History  Diagnosis Date  . Chronic kidney disease, stage II (mild) 12/25/2012  . Unspecified arthropathy, lower leg 09/11/2011  . Swelling of limb 09/10/2012  . Other and unspecified hyperlipidemia 07/26/2011  . Actinic keratosis 07/26/2011  . Abnormality of gait 07/26/2011  . Hyperglycemia 07/26/2011  . Tear film insufficiency, unspecified 12/29/2009  . Other malaise and fatigue 12/29/2009  . Unspecified hearing loss 06/30/2009  . Unspecified hypothyroidism 12/15/2008  . Unspecified essential hypertension 12/15/2008  . Unspecified glaucoma 12/15/2008    both eyes  . Weak 08/02/13  . Abnormality of gait 08/02/13  . Hyponatremia 08/02/13  . Herpes zoster 08/02/13  . Post herpetic neuralgia 08/02/13  . Cerebral atrophy 08/02/13  . Cerebrovascular disease 08/02/13  . Atherosclerosis 08/02/13  . Abnormal chest x-ray 08/02/13    08/02/13 nodule in left mid to upper lung  . Murmur, cardiac 08/02/13    aortic ejection  . Fracture lumbar vertebra-closed 08/02/13    L1  . Memory changes 07/03/13  . Lung mass 07/01/2014   Past Surgical History:  Past Surgical History  Procedure Laterality Date  . Small intestine surgery  1957  . Cardiac catheterization  2004  . Gallbladder surgery  2006  . Cholecystectomy    . Femur im nail Left 07/02/2014    Procedure: INTRAMEDULLARY (IM) NAIL FEMORAL;  Surgeon: Loanne Drilling, MD;  Location: [facility] ORS;  Service: Orthopedics;  Laterality: Left;   HPI:  80 yo female adm to [facility] after having a fall, s/p surgical repair at Left IM - nail femoral.  Pt also with h/o GERD, post herpetic neuralgia.  Pt for swallow evaluation due to concerns for aspiration risk - She was found to have CXR with possible right to lower lobe pna and left pna.     Assessment / Plan / Recommendation Clinical Impression  Pt presents with functional oropharyngeal swallow ability.  No focal CN deficits nor s/s of airway compromise with intake.  Adequate mastication ability noted despite ill fitting Aspen-collar.  Pt denies issues with swallowing and RN reports pt takes several pills at a time.  Ill fitting cervical collar may impact pt's ability to self feed and mandibular ROM- RN reports they are trying to get a new collar.  Pt denies issues with reflux disease. Encouraged her to use spirometer and demonstrated to pt.  SLP to sign off as all education is completed.       Aspiration Risk  Mild    Diet Recommendation Regular;Thin liquid   Liquid Administration via: Cup;Straw Medication Administration: Whole meds with liquid Supervision: Patient able to self feed Compensations: Slow rate;Small sips/bites (set up assist, may need more assist d/t cervical collar) Postural Changes and/or Swallow Maneuvers: Seated upright 90 degrees;Upright 30-60 min after meal    Other  Recommendations Oral Care Recommendations: Oral care BID   Follow Up Recommendations  None    Frequency and Duration        Pertinent Vitals/Pain Afebrile, decreased     Swallow Study Prior Functional Status   pt reports she eats small amounts but denies dysphagia    General Date of Onset: 07/05/14 HPI: 80 yo female adm to [facility] after having a fall, s/p surgical repair at Left IM - nail femoral.  Pt also with h/o GERD, post herpetic neuralgia.  Pt for swallow evaluation due to concerns for aspiration risk - She was found to have CXR with possible right to lower lobe pna and left pna.  Type of Study: Bedside swallow evaluation Diet Prior to this Study: Regular;Thin liquids Temperature Spikes Noted: No Respiratory Status: Room air History of Recent Intubation: No Behavior/Cognition: Alert Oral Cavity - Dentition: Adequate natural dentition Self-Feeding Abilities: Able to feed  self Patient Positioning: Upright in bed Baseline Vocal Quality: Clear Volitional Cough: Strong Volitional Swallow: Able to elicit    Oral/Motor/Sensory Function Overall Oral Motor/Sensory Function: Appears within functional limits for tasks assessed   Ice Chips Ice chips: Not tested   Thin Liquid Thin Liquid: Within functional limits Presentation: Straw    Nectar Thick Nectar Thick Liquid: Not tested   Honey Thick Honey Thick Liquid: Not tested   Puree Puree: Within functional limits Presentation: Spoon;Self Fed   Solid   GO    Solid: Within functional limits Presentation: Self Fed       Mickie Bail [place], [facility] SLP [phone]

## 2014-07-05 NOTE — Progress Notes (Addendum)
Subjective: 3 Days Post-Op Procedure(s) (LRB): INTRAMEDULLARY (IM) NAIL FEMORAL (Left) Patient reports pain as mild.   Patient seen in rounds with Dr. Lequita Halt. The [employer] continues to ride up onto her face.  Can see if [employer] can fit her with a smaller collar but needs to be in an Aspen type collar for now. Patient is well, and has had no acute complaints or problems Plan is to go Skilled nursing facility after hospital stay.  [facility] resident.  Objective: Vital signs in last 24 hours: Temp:  [98.5 F (36.9 C)-99.5 F (37.5 C)] 99.2 F (37.3 C) (09/14 0339) Pulse Rate:  [67-89] 89 (09/14 0339) Resp:  [14-16] 16 (09/14 0339) BP: (111-172)/(30-59) 172/59 mmHg (09/14 0339) SpO2:  [95 %-99 %] 96 % (09/14 0339)  Intake/Output from previous day:  Intake/Output Summary (Last 24 hours) at 07/05/14 0650 Last data filed at 07/05/14 0340  Gross per 24 hour  Intake   3356 ml  Output   1880 ml  Net   1476 ml    Intake/Output this shift: Total I/O In: 965 [P.O.:600; I.V.:30; Blood:335] Out: 1400 [Urine:1400]  Labs:  Recent Labs  07/03/14 0345 07/04/14 0445 07/04/14 2130 07/05/14 0445  HGB 8.3* 7.1* 11.0* 10.5*    Recent Labs  07/04/14 2130 07/05/14 0445  WBC 12.0* 12.7*  RBC 3.73* 3.47*  HCT 33.3* 30.7*  PLT 189 205    Recent Labs  07/04/14 0445 07/05/14 0445  NA 132* 135*  K 3.8 4.3  CL 98 99  CO2 21 24  BUN 25* 35*  CREATININE 0.90 1.15*  GLUCOSE 117* 121*  CALCIUM 8.0* 8.0*   No results found for this basename: LABPT, INR,  in the last 72 hours  EXAM General - Patient is Alert and Appropriate Extremity - Neurovascular intact Sensation intact distally Dressing/Incision - clean, dry, no drainage, healing Motor Function - intact, moving foot and toes well on exam.   Past Medical History  Diagnosis Date  . Chronic kidney disease, stage II (mild) 12/25/2012  . Unspecified arthropathy, lower leg 09/11/2011  . Swelling of limb 09/10/2012  .  Other and unspecified hyperlipidemia 07/26/2011  . Actinic keratosis 07/26/2011  . Abnormality of gait 07/26/2011  . Hyperglycemia 07/26/2011  . Tear film insufficiency, unspecified 12/29/2009  . Other malaise and fatigue 12/29/2009  . Unspecified hearing loss 06/30/2009  . Unspecified hypothyroidism 12/15/2008  . Unspecified essential hypertension 12/15/2008  . Unspecified glaucoma 12/15/2008    both eyes  . Weak 08/02/13  . Abnormality of gait 08/02/13  . Hyponatremia 08/02/13  . Herpes zoster 08/02/13  . Post herpetic neuralgia 08/02/13  . Cerebral atrophy 08/02/13  . Cerebrovascular disease 08/02/13  . Atherosclerosis 08/02/13  . Abnormal chest x-ray 08/02/13    08/02/13 nodule in left mid to upper lung  . Murmur, cardiac 08/02/13    aortic ejection  . Fracture lumbar vertebra-closed 08/02/13    L1  . Memory changes 07/03/13  . Lung mass 07/01/2014    Assessment/Plan: 3 Days Post-Op Procedure(s) (LRB): INTRAMEDULLARY (IM) NAIL FEMORAL (Left) Principal Problem:   Closed fracture of intertrochanteric section of femur Active Problems:   Chronic kidney disease, stage II (mild)   Hypothyroidism   Unspecified essential hypertension   Nodule of left lung   Pulmonary hypertension   Anemia of chronic disease   GERD (gastroesophageal reflux disease)   Closed left hip fracture   Traumatic compression fracture of T1 thoracic vertebra   C7 cervical fracture   Lung  mass; LUL   Acute blood loss anemia   Leukocytosis, unspecified   HCAP (healthcare-associated pneumonia)  Estimated body mass index is 21.42 kg/(m^2) as calculated from the following:   Height as of this encounter:  (1.549 m).   Weight as of this encounter: 51.4 kg (113 lb 5.1 oz). Up with therapy Discharge to SNF - [facility]  DVT Prophylaxis - Aspirin and Lovenox Weight Bearing As Tolerated left Leg DC Vicodin - Only using Ultram - RX on wall chart. DVT - Lovenox for a total of 10 days and then Aspirin 325 mg  daily for three more weeks. - RX on wall chart. WBAT to left leg F/U with Dr. Lequita Halt in 2 weeks.  Please have [facility] help make appointment for patient.  Avel Peace, PA-C Orthopaedic Surgery 07/05/2014, 6:50 AM

## 2014-07-05 NOTE — Evaluation (Signed)
Occupational Therapy Evaluation Patient Details Name: Kim Hodges MRN: [id number] DOB: 09-Oct-1909 Today's Date: 07/05/2014  History of Present Illness 80 yo female adm 06/30/14 after mechanical fall resulting in left hip fx; Pt also  had fall ~3wks ago and C-spine xray this adm revealed C7 fx; Pt underwent IM nail on 07/02/14 per Dr. Lequita Halt.  PMHx: L1 fx, HTN, CKD   Clinical Impression   Pt presents to OT with decreased I with ADL activity due to problems listed below. Pt will benefit from skilled OT to increase I with ADL activity and return to PLOF    Follow Up Recommendations  SNF    Equipment Recommendations  None recommended by OT       Precautions / Restrictions Precautions Precautions: Cervical;Fall Required Braces or Orthoses: Cervical Brace Cervical Brace: Hard collar;At all times Restrictions LLE Weight Bearing: Weight bearing as tolerated      Mobility (per PT note) Bed Mobility Overal bed mobility: Needs Assistance Bed Mobility: Supine to Sit     Supine to sit: +2 for physical assistance;Max assist     General bed mobility comments: +2 forsitting up at the edge, bed pad used to turn  patient and both legs moved together.  Transfers Overall transfer level: Needs assistance Equipment used: Rolling walker (2 wheeled) Transfers: Sit to/from [employer] Sit to Stand: Max assist;+2 physical assistance;+2 safety/equipment Stand pivot transfers: +2 safety/equipment;+2 physical assistance;Max assist       General transfer comment:  +2 armhold to stand and pivot to [facility], use of RW to stand and pivot to recliner but pt really not able to use the RW today, still needed support. Did place more weight through LLE    Balance   Sitting-balance support: Bilateral upper extremity supported;Feet supported Sitting balance-Leahy Scale: Poor Sitting balance - Comments: improved  balance at the edge of the bed.                                     ADL Overall ADL's : Needs assistance/impaired Eating/Feeding: Minimal assistance;Sitting   Grooming: Minimal assistance;Sitting                                 General ADL Comments: OT assisted [employer] don new cervical brace in sitting. Pt declined getting back to bed or standing               Pertinent Vitals/Pain Pain Assessment: Faces Faces Pain Scale: Hurts little more Pain Location: L hip but states it was not as bad as last time. Pain Descriptors / Indicators: Discomfort Pain Intervention(s): Monitored during session;Repositioned        Extremity/Trunk Assessment Upper Extremity Assessment Upper Extremity Assessment: Generalized weakness (did not have pt reach above 90 degrees)           Communication Communication Communication: No difficulties   Cognition Arousal/Alertness: Awake/alert Behavior During Therapy: WFL for tasks assessed/performed Overall Cognitive Status: Within Functional Limits for tasks assessed                     General Comments               Home Living Family/patient expects to be discharged to:: Skilled nursing facility  Additional Comments: pt is from ALF at [facility]      Prior Functioning/Environment Level of Independence: Independent with assistive device(s)             OT Diagnosis: Generalized weakness   OT Problem List: Decreased strength;Decreased activity tolerance   OT Treatment/Interventions: Self-care/ADL training;Patient/family education;DME and/or AE instruction    OT Goals(Current goals can be found in the care plan section) Acute Rehab OT Goals Patient Stated Goal: to walk, get stronger OT Goal Formulation: With patient Time For Goal Achievement: 07/19/14 Potential to Achieve Goals: Good  OT Frequency: Min 2X/week              End of Session Nurse Communication: Mobility status  Activity Tolerance: Patient limited by  fatigue Patient left: in chair;with call bell/phone within reach   Time: 1520-1538 OT Time Calculation (min): 18 min Charges:  OT General Charges $OT Visit: 1 Procedure OT Evaluation $Initial OT Evaluation Tier I: 1 Procedure OT Treatments $Self Care/Home Management : 8-22 mins G-Codes:    Einar Crow D 2014-07-16, 3:40 PM

## 2014-07-06 LAB — CBC
HCT: 31.3 % — ABNORMAL LOW (ref 36.0–46.0)
HEMOGLOBIN: 10.6 g/dL — AB (ref 12.0–15.0)
MCH: 29.9 pg (ref 26.0–34.0)
MCHC: 33.9 g/dL (ref 30.0–36.0)
MCV: 88.4 fL (ref 78.0–100.0)
Platelets: 250 10*3/uL (ref 150–400)
RBC: 3.54 MIL/uL — ABNORMAL LOW (ref 3.87–5.11)
RDW: 14.3 % (ref 11.5–15.5)
WBC: 11.6 10*3/uL — ABNORMAL HIGH (ref 4.0–10.5)

## 2014-07-06 LAB — BASIC METABOLIC PANEL
Anion gap: 13 (ref 5–15)
BUN: 37 mg/dL — ABNORMAL HIGH (ref 6–23)
CALCIUM: 8 mg/dL — AB (ref 8.4–10.5)
CO2: 22 mEq/L (ref 19–32)
CREATININE: 1.01 mg/dL (ref 0.50–1.10)
Chloride: 96 mEq/L (ref 96–112)
GFR calc Af Amer: 50 mL/min — ABNORMAL LOW (ref >90)
GFR, EST NON AFRICAN AMERICAN: 43 mL/min — AB (ref >90)
GLUCOSE: 110 mg/dL — AB (ref 70–99)
Potassium: 4.1 mEq/L (ref 3.7–5.3)
Sodium: 131 mEq/L — ABNORMAL LOW (ref 137–147)

## 2014-07-06 MED ORDER — DSS 100 MG PO CAPS: 100.0000 mg | ORAL_CAPSULE | Freq: Two times a day (BID) | ORAL | Status: AC

## 2014-07-06 MED ORDER — SENNA 8.6 MG PO TABS: 1.0000 | ORAL_TABLET | Freq: Two times a day (BID) | ORAL | Status: AC

## 2014-07-06 MED ORDER — GUAIFENESIN ER 600 MG PO TB12
600.0000 mg | ORAL_TABLET | Freq: Two times a day (BID) | ORAL | Status: AC
Start: 2014-07-06 — End: ?

## 2014-07-06 MED ORDER — ENSURE COMPLETE PO LIQD: 237.0000 mL | Freq: Two times a day (BID) | ORAL | Status: AC

## 2014-07-06 MED ORDER — FENTANYL 12 MCG/HR TD PT72: 12.5000 ug | MEDICATED_PATCH | TRANSDERMAL | Status: DC

## 2014-07-06 MED ORDER — SODIUM CHLORIDE 0.9 % IV BOLUS (SEPSIS)
250.0000 mL | Freq: Once | INTRAVENOUS | Status: AC
  Administered 2014-07-06: 250 mL via INTRAVENOUS

## 2014-07-06 MED ORDER — FERROUS SULFATE 325 (65 FE) MG PO TABS: 325.0000 mg | ORAL_TABLET | Freq: Every day | ORAL | Status: DC

## 2014-07-06 MED ORDER — AMOXICILLIN-POT CLAVULANATE 875-125 MG PO TABS
1.0000 | ORAL_TABLET | Freq: Two times a day (BID) | ORAL | Status: AC
Start: 2014-07-06 — End: 2014-07-12

## 2014-07-06 MED ORDER — CARVEDILOL 3.125 MG PO TABS: 3.1250 mg | ORAL_TABLET | Freq: Two times a day (BID) | ORAL | Status: AC

## 2014-07-06 MED ORDER — GUAIFENESIN ER 600 MG PO TB12
600.0000 mg | ORAL_TABLET | Freq: Two times a day (BID) | ORAL | Status: DC
  Administered 2014-07-06: 600 mg via ORAL
  Filled 2014-07-06 (×2): qty 1

## 2014-07-06 NOTE — Progress Notes (Signed)
Subjective: 4 Days Post-Op Procedure(s) (LRB): INTRAMEDULLARY (IM) NAIL FEMORAL (Left) Patient reports to be doing okay.  She does not appear to be in any distress. Patient seen in rounds with Dr. Lequita Halt. Being treated for bronchopneumonia. Patient is well, and has had no acute complaints or problems Plan is to go Skilled nursing facility after hospital stay.  Objective: Vital signs in last 24 hours: Temp:  [98 F (36.7 C)-98.9 F (37.2 C)] 98 F (36.7 C) (09/15 0530) Pulse Rate:  [72-79] 72 (09/15 0530) Resp:  [15-16] 15 (09/15 0530) BP: (141-151)/(41-50) 141/45 mmHg (09/15 0530) SpO2:  [99 %] 99 % (09/15 0530)  Intake/Output from previous day:  Intake/Output Summary (Last 24 hours) at 07/06/14 0737 Last data filed at 07/06/14 0200  Gross per 24 hour  Intake    560 ml  Output    503 ml  Net     57 ml    Labs:  Recent Labs  07/04/14 0445 07/04/14 2130 07/05/14 0445 07/06/14 0425  HGB 7.1* 11.0* 10.5* 10.6*    Recent Labs  07/05/14 0445 07/06/14 0425  WBC 12.7* 11.6*  RBC 3.47* 3.54*  HCT 30.7* 31.3*  PLT 205 250    Recent Labs  07/05/14 0445 07/06/14 0425  NA 135* 131*  K 4.3 4.1  CL 99 96  CO2 24 22  BUN 35* 37*  CREATININE 1.15* 1.01  GLUCOSE 121* 110*  CALCIUM 8.0* 8.0*   No results found for this basename: LABPT, INR,  in the last 72 hours  EXAM General - Patient is Alert Extremity - Neurovascular intact Sensation intact distally Dorsiflexion/Plantar flexion intact No cellulitis present Dressing/Incision - clean, dry, no drainage.  May leave dressing off. Motor Function - intact, moving foot and toes well on exam.   Past Medical History  Diagnosis Date  . Chronic kidney disease, stage II (mild) 12/25/2012  . Unspecified arthropathy, lower leg 09/11/2011  . Swelling of limb 09/10/2012  . Other and unspecified hyperlipidemia 07/26/2011  . Actinic keratosis 07/26/2011  . Abnormality of gait 07/26/2011  . Hyperglycemia 07/26/2011  .  Tear film insufficiency, unspecified 12/29/2009  . Other malaise and fatigue 12/29/2009  . Unspecified hearing loss 06/30/2009  . Unspecified hypothyroidism 12/15/2008  . Unspecified essential hypertension 12/15/2008  . Unspecified glaucoma 12/15/2008    both eyes  . Weak 08/02/13  . Abnormality of gait 08/02/13  . Hyponatremia 08/02/13  . Herpes zoster 08/02/13  . Post herpetic neuralgia 08/02/13  . Cerebral atrophy 08/02/13  . Cerebrovascular disease 08/02/13  . Atherosclerosis 08/02/13  . Abnormal chest x-ray 08/02/13    08/02/13 nodule in left mid to upper lung  . Murmur, cardiac 08/02/13    aortic ejection  . Fracture lumbar vertebra-closed 08/02/13    L1  . Memory changes 07/03/13  . Lung mass 07/01/2014    Assessment/Plan: 4 Days Post-Op Procedure(s) (LRB): INTRAMEDULLARY (IM) NAIL FEMORAL (Left) Principal Problem:   Closed fracture of intertrochanteric section of femur Active Problems:   Chronic kidney disease, stage II (mild)   Hypothyroidism   Unspecified essential hypertension   Nodule of left lung   Pulmonary hypertension   Anemia of chronic disease   GERD (gastroesophageal reflux disease)   Closed left hip fracture   Traumatic compression fracture of T1 thoracic vertebra   C7 cervical fracture   Lung mass; LUL   Acute blood loss anemia   Leukocytosis, unspecified   HCAP (healthcare-associated pneumonia)  Estimated body mass index is 21.42 kg/(m^2) as calculated  from the following:   Height as of this encounter:  (1.549 m).   Weight as of this encounter: 51.4 kg (113 lb 5.1 oz).  On IV ABX for treatment for bronchopneumonia. Left incisions look good. May leave dressing off. Ready from an ortho standpoint to go to SNF whenever she is medically stable. Aspen Collar at all times for C7 laminar fracture.    DVT Prophylaxis - Lovenox for a total of 10 days and then Aspirin 325 mg daily for three more weeks. - RX on wall chart. WBAT to left leg  F/U with Dr.  Lequita Halt in 2 weeks. Please have [facility] help make appointment for patient. DC Vicodin'd - Ultram for pain - RX on wall chart. May Shower but do not submerge incisions under water.  Avel Peace, PA-C Orthopaedic Surgery 07/06/2014, 7:37 AM

## 2014-07-06 NOTE — Progress Notes (Signed)
Physical Therapy Treatment Patient Details Name: Kim Hodges MRN: [id number] DOB: [birth year] Today's Date: 07/06/2014    History of Present Illness 80 yo female adm 06/30/14 after mechanical fall resulting in left hip fx; Pt also  had fall ~3wks ago and C-spine xray this adm revealed C7 fx; Pt underwent IM nail on 07/02/14 per Dr. Lequita Halt.  PMHx: L1 fx, HTN, CKD    PT Comments    Pt on BSC with nursing; assisted pt back to bed after manipulating the environment to incr safety of therapy and pt; Pt cooperative, able to bear slightly more wt on LLE this pm;  Follow Up Recommendations  SNF     Equipment Recommendations  None recommended by PT    Recommendations for Other Services       Precautions / Restrictions Precautions Precautions: Cervical;Fall Required Braces or Orthoses: Cervical Brace Cervical Brace: Hard collar;At all times Restrictions Weight Bearing Restrictions: No LLE Weight Bearing: Weight bearing as tolerated    Mobility  Bed Mobility Overal bed mobility: Needs Assistance Bed Mobility: Sit to Supine      Sit to supine: +2 for physical assistance;+2 for safety/equipment;Max assist   General bed mobility comments: assist with trunk and LEs  Transfers Overall transfer level: Needs assistance Equipment used: None Transfers: Sit to/from Stand;Stand Pivot Transfers Sit to Stand: Max assist;+2 physical assistance;+2 safety/equipment Stand pivot transfers: +2 safety/equipment;+2 physical assistance;Max assist       General transfer comment: +2 for balance, wt shift to stand, safety; multi-modal cues throughout for hand placement, technique, etc   Ambulation/Gait                 Stairs            Wheelchair Mobility    Modified Rankin (Stroke Patients Only)       Balance Overall balance assessment: Needs assistance Sitting-balance support: Feet supported;Bilateral upper extremity supported;Single extremity supported Sitting  balance-Leahy Scale: Fair Sitting balance - Comments: pt able to sit EOB x 6 min without support; still leaning to right but improved  Postural control: Right lateral lean                          Cognition Arousal/Alertness: Awake/alert Behavior During Therapy: WFL for tasks assessed/performed         Memory: Decreased short-term memory              Exercises General Exercises - Lower Extremity Ankle Circles/Pumps: AROM;Both;10 reps Heel Slides: AROM;10 reps;Left    General Comments        Pertinent Vitals/Pain Pain Assessment: Faces Faces Pain Scale: Hurts little more Pain Location: L hip Pain Descriptors / Indicators: Aching Pain Intervention(s): Monitored during session;Repositioned    Home Living                      Prior Function            PT Goals (current goals can now be found in the care plan section) Acute Rehab PT Goals Patient Stated Goal: to walk, get stronger PT Goal Formulation: With patient Time For Goal Achievement: 07/10/14 Potential to Achieve Goals: Good Progress towards PT goals: Progressing toward goals    Frequency  Min 3X/week    PT Plan Current plan remains appropriate    Co-evaluation             End of Session Equipment Utilized During Treatment: Gait belt Activity Tolerance: Patient  tolerated treatment well Patient left: in bed;with call bell/phone within reach;with bed alarm set     Time: 1316-1330 PT Time Calculation (min): 14 min  Charges:  $Therapeutic Activity: 8-22 mins                    G Codes:      Kim Hodges 07-27-2014, 1:49 PM

## 2014-07-06 NOTE — Discharge Summary (Signed)
Physician Discharge Summary  BETHSAIDA SIEGENTHALER QVZ:[id number] DOB: 1909-01-23 DOA: 06/30/2014  PCP: [name], MD  Admit date: 06/30/2014 Discharge date: 07/06/2014  Time spent: 70 minutes  Recommendations for Outpatient Follow-up:  1. Patient be discharged to a friend's home skilled nursing facility. Patient will followup with GREEN, Lenon Curt, MD one week post discharge. On followup patient's pneumonia will need to be reassessed. A CBC will also need to be obtained to followup on patient's hemoglobin. A basic metabolic profile today to be obtained to followup on patient's electrolytes and renal function. 2. Followup with Dr.Aluisio in 2 weeks. 3. Followup with Dr. Shon Baton of orthopedics in 4 weeks.  Discharge Diagnoses:  Principal Problem:   Closed fracture of intertrochanteric section of femur Active Problems:   Chronic kidney disease, stage II (mild)   Hypothyroidism   Unspecified essential hypertension   Nodule of left lung   Pulmonary hypertension   Anemia of chronic disease   GERD (gastroesophageal reflux disease)   Closed left hip fracture   Traumatic compression fracture of T1 thoracic vertebra   C7 cervical fracture   Lung mass; LUL   Acute blood loss anemia   Leukocytosis, unspecified   HCAP (healthcare-associated pneumonia)   Discharge Condition: Stable and improved  Diet recommendation: Regular  Filed Weights   06/30/14 2230  Weight: 51.4 kg (113 lb 5.1 oz)    History of present illness:  Kim Hodges is a 80 y.o. female with Past medical history of postherpetic neurology.  The pt presented with mechanical fall. She mentioned she has been walking with a walker and lost her balance and fell backwards. She denied hitting her head of neck and fell on her hip. She denied feeling dizzy or lightheaded prior to fall or any fever, chills, headache, cough, chest pain, palpitation, shortness of breath, nausea, vomiting, abdominal pain, diarrhea, constipation, active  bleeding, burning urination, dizziness, pedal edema, focal neurological deficit.  There is a h/o fall prior to this in July at which time she has some neck pain and shoulder pain. She continued to have the neck pain at present but the shoulder pain has resolved.  The patient is coming from ALF. And at her baseline independent for most of her ADL.  Review of Systems: as mentioned in the history of present illness.    Hospital Course:  #1 left intertrochanteric femur fracture  Secondary to mechanical fall. Patient has been seen by orthopedics and due to some concerns for possible abnormalities on prior echo repeat 2-D echo has been ordered. Patient denied any chest pain or shortness of breath. Patient denied any cardiac history. Patient had cardiac catheterization in 2012 with normal coronaries.patient had a 2-D echo in September of 2005 with EF of 55-65% with no wall motion abnormalities. Mild aortic valve thickness, mild to moderate mitral annular calcification consistent with mild mitral stenosis left atrium was mildly dilated. Repeat 2-D echo with EF of 60-65%, mild aortic valve stenosis, severe pulmonary hypertension, grade 2 diastolic dysfunction. Patient was asymptomatic. Patient was placed preoperatively on Coreg and low-dose Norvasc. Patient underwent surgery without any complications. Patient s/p IM nail left intertrochanteric femur 07/02/14. Patient was followed by orthopedics and was seen by physical therapy. Patient be discharged in stable and improved condition. Patient will followup with orthopedics as outpatient.  #2 T1 compression fracture of C7 lamina fracture  Per CT of the C-spine.. Patient denied any numbness and tingling in the upper extremities and minor complaints of some mild neck discomfort. Patient  is moving her neck. Due to probable surgical intervention needed at patient's left femur fracture orthopedics recommended neurosurgical evaluation for further input. Patient was placed on  Aspen collar. Ortho spine has seen patient and recommending to continue Aspen collar with outpatient follow up in 4 weeks.  #3 healthcare associated pneumonia versus aspiration pneumonia  Patient noted to have a leukocytosis that was worsening over the past one to 2 days postoperatively. Chest x-ray which came back with multifocal ill-defined opacities developing in the right mid to lower lung concerning for developing bronchopneumonia. Patient was asymptomatic however patient status post recent surgery and possible concern for aspiration pneumonia. Urine Legionella antigen negative. Urine pneumococcus antigen negative. Patient was placed empirically on IV Zosyn. Patient be discharged on 6 more days of oral Augmentin to complete 8 day course of antibiotic therapy the patient be discharged in stable and improved condition.  #4 history of lung mass  Patient with a prior history of a pulmonary mass suspicious for malignancy however family decided not to pursue any further workup at this time. Patient is not hypoxic. Patient is not tachypneic. Continue pulmonary toilet.  #5 severe pulmonary hypertension/moderate aortic stenosis  Patient was admitted with a hip fracture. Preoperative workup which was done included a 2-D echo which shows severe pulmonary hypertension and moderate aortic stenosis. Patient remained asymptomatic. Patient was placed on a beta blocker preoperatively and low-dose Norvasc was added. Patient remained in stable condition patient tolerated surgery well and post surgery Norvasc was discontinued. Patient was maintained on low-dose Coreg and will be discharged on this. #6 postherpetic neuralgia  Continued on home pain regimen of fentanyl patch and gabapentin.  #7 hypothyroidism  Continued on home dose Synthroid.  #8 hypertension  On admission patient was noted to be hypertensive. It was felt that patient's pain was also partially contributing to this. Patient was initially started on  low-dose Coreg and low-dose Norvasc was added. However due to low diastolic blood pressure Norvasc was discontinued. Patient will be discharged on a low-dose Coreg.  #9 gastroesophageal reflux disease  Patient was maintained on a proton pump inhibitor throughout the hospitalization. #10 anemia/ABLA  Likely secondary to anemia of chronic disease and ABLA postop. No overt bleeding. Patient was transfused 2 units packed red blood cells with appropriate response. Patient's hemoglobin was 10.6 by day of discharge.  #11 Leukocytosis  Likely secondary to PNA per CXR. Chest x-ray with concerns for developing right mid and lower lobe bronchopneumonia. Urinalysis is negative. Urine Legionella antigen neg. Urine pneumococcus antigen negative. Patient was placed empirically on IV Zosyn. Patient remained stable throughout the hospitalization. Patient was discharged on 6 more days of oral Augmentin to complete a day course of antibiotic therapy.      Procedures: CT C-spine 06/30/2014  X-ray of the left hip 06/30/2014  X-ray of the right foot 06/30/2014  Chest x-ray 06/30/2001, 07/04/2014  X-ray of the right ankle 06/30/2014  2 d echo 07/01/14  2 units PRBC 07/04/14 IM nail L intertrochanteric femur 07/02/14     Consultations: Orthopedics: Dr Lequita Halt 009/06/2014  Orthopedics/Spine: Dr Shon Baton 07/02/14   Discharge Exam: Filed Vitals:   07/06/14 1438  BP: 172/58  Pulse: 68  Temp: 97.6 F (36.4 C)  Resp: 14    General: NAD Cardiovascular: RRR Respiratory: CTAB  Discharge Instructions You were cared for by a hospitalist during your hospital stay. If you have any questions about your discharge medications or the care you received while you were in the hospital after you are  discharged, you can call the unit and asked to speak with the hospitalist on call if the hospitalist that took care of you is not available. Once you are discharged, your primary care physician will handle any further medical  issues. Please note that NO REFILLS for any discharge medications will be authorized once you are discharged, as it is imperative that you return to your primary care physician (or establish a relationship with a primary care physician if you do not have one) for your aftercare needs so that they can reassess your need for medications and monitor your lab values.  Discharge Instructions   Call MD / Call 911    Complete by:  As directed   If you experience chest pain or shortness of breath, CALL 911 and be transported to the hospital emergency room.  If you develope a fever above 101 F, pus (white drainage) or increased drainage or redness at the wound, or calf pain, call your surgeon's office.     Change dressing    Complete by:  As directed   You may change your dressing dressing daily with sterile 4 x 4 inch gauze dressing and paper tape.  Do not submerge the incision under water.     Constipation Prevention    Complete by:  As directed   Drink plenty of fluids.  Prune juice may be helpful.  You may use a stool softener, such as Colace (over the counter) 100 mg twice a day.  Use MiraLax (over the counter) for constipation as needed.     Diet - low sodium heart healthy    Complete by:  As directed      Diet general    Complete by:  As directed      Discharge instructions    Complete by:  As directed   Pick up stool softner and laxative for home. Do not submerge incision under water. May shower. Continue to use ice for pain and swelling from surgery.  Weight bearing as tolerated to left leg.  Lovenox injections for a total of ten days (seven more days) and then switch over to a full dose 325 mg Aspirin daily for three weeks.  After the three weeks of 325 mg Aspirin, then resume the baby 81 mg Aspirin daily.  When discharged from the skilled rehab facility, please have the facility set up the patient's Home Health Physical Therapy prior to being released.  Also provide the patient with their  medications at time of release from the facility to include their pain medication, the muscle relaxants, and their blood thinner medication.  If the patient is still at the rehab facility at time of follow up appointment, please also assist the patient in arranging follow up appointment in our office and any transportation needs.     Discharge instructions    Complete by:  As directed   Follow up with GREEN, Lenon Curt, MD in 1 week. Follow up with Dr Lequita Halt in 2 weeks. Follow up with Dr Shon Baton in 4 weeks.     Do not sit on low chairs, stoools or toilet seats, as it may be difficult to get up from low surfaces    Complete by:  As directed      Driving restrictions    Complete by:  As directed   No driving until released by the physician.     Increase activity slowly as tolerated    Complete by:  As directed      Lifting  restrictions    Complete by:  As directed   No lifting until released by the physician.     Patient may shower    Complete by:  As directed   You may shower without a dressing once there is no drainage.  Do not wash over the wound.  If drainage remains, do not shower until drainage stops.     TED hose    Complete by:  As directed   Use stockings (TED hose) for 3 weeks on both leg(s).  You may remove them at night for sleeping.     Weight bearing as tolerated    Complete by:  As directed   Laterality:  left  Extremity:  Lower     Weight bearing as tolerated    Complete by:  As directed   Laterality:  left  Extremity:  Lower          Current Discharge Medication List    START taking these medications   Details  amoxicillin-clavulanate (AUGMENTIN) 875-125 MG per tablet Take 1 tablet by mouth 2 (two) times daily. Take for 6 days then stop Qty: 12 tablet, Refills: 0    carvedilol (COREG) 3.125 MG tablet Take 1 tablet (3.125 mg total) by mouth 2 (two) times daily with a meal. Qty: 62 tablet, Refills: 0    docusate sodium 100 MG CAPS Take 100 mg by mouth 2 (two)  times daily. Qty: 10 capsule, Refills: 0    enoxaparin (LOVENOX) 30 MG/0.3ML injection Inject 0.3 mLs (30 mg total) into the skin daily. Take injections for seven more days and then switch over to a 325 mg Aspirin daily for three more weeks. Qty: 7 Syringe, Refills: 0    feeding supplement, ENSURE COMPLETE, (ENSURE COMPLETE) LIQD Take 237 mLs by mouth 2 (two) times daily between meals.    ferrous sulfate 325 (65 FE) MG tablet Take 1 tablet (325 mg total) by mouth daily with breakfast.    guaiFENesin (MUCINEX) 600 MG 12 hr tablet Take 1 tablet (600 mg total) by mouth 2 (two) times daily. Take for 6 days then stop. Qty: 12 tablet, Refills: 0    senna (SENOKOT) 8.6 MG TABS tablet Take 1 tablet (8.6 mg total) by mouth 2 (two) times daily. Qty: 120 each, Refills: 0    traMADol (ULTRAM) 50 MG tablet Take 1-2 tablets (50-100 mg total) by mouth every 6 (six) hours as needed (mild to moderate pain). Qty: 80 tablet, Refills: 1      CONTINUE these medications which have CHANGED   Details  fentaNYL (DURAGESIC - DOSED MCG/HR) 12 MCG/HR Place 1 patch (12.5 mcg total) onto the skin every 3 (three) days. Qty: 5 patch, Refills: 0      CONTINUE these medications which have NOT CHANGED   Details  acetaminophen (TYLENOL ARTHRITIS PAIN) 650 MG CR tablet Take 650 mg by mouth 3 (three) times daily as needed for pain. May also take one tablet every 6 hours as needed for pain    Calcium Carb-Cholecalciferol (CALCIUM 500 +D) 500-400 MG-UNIT TABS Take 1 tablet by mouth daily.    dorzolamide-timolol (COSOPT) 22.3-6.8 MG/ML ophthalmic solution Place 1 drop into both eyes 2 (two) times daily.     gabapentin (NEURONTIN) 100 MG capsule Take 100 mg by mouth 3 (three) times daily.     levothyroxine (SYNTHROID, LEVOTHROID) 75 MCG tablet Take 75 mcg by mouth daily before breakfast.    omeprazole (PRILOSEC) 20 MG capsule Take 20 mg by mouth daily.  Specialty Vitamins Products (ICAPS LUTEIN-ZEAXANTHIN PO) Take 1  tablet by mouth daily.      STOP taking these medications     aspirin 81 MG chewable tablet      HYDROcodone-acetaminophen (NORCO/VICODIN) 5-325 MG per tablet        No Known Allergies Follow-up Information   Follow up with Loanne Drilling, MD. Schedule an appointment as soon as possible for a visit in 2 weeks. (Call office for appointment at [phone] to set up follow up appointment for patient.)    Specialty:  Orthopedic Surgery   Contact information:   [address] [place] [address] [phone]       Follow up with GREEN, Lenon Curt, MD. Schedule an appointment as soon as possible for a visit in 1 week.   Specialty:  Internal Medicine   Contact information:   [address] [place] [address] [phone]       Follow up with Alvy Beal, MD. Schedule an appointment as soon as possible for a visit in 4 weeks.   Specialty:  Orthopedic Surgery   Contact information:   [address] [place] [address] [phone]        The results of significant diagnostics from this hospitalization (including imaging, microbiology, ancillary and laboratory) are listed below for reference.    Significant Diagnostic Studies: Dg Chest 1 View  06/30/2014   CLINICAL DATA:  Pain post trauma  EXAM: CHEST - 1 VIEW  COMPARISON:  Chest CT May 24, 2014  FINDINGS: Mass in the left upper lobe is again noted measuring approximately 2.3 x 1.8 cm. Elsewhere, lungs are clear. Heart is upper normal in size with pulmonary vascularity within normal limits. No adenopathy. No pneumothorax. No fractures apparent.  IMPRESSION: Right upper lobe mass again noted, suspicious for neoplasm. Elsewhere lungs clear. No pneumothorax apparent.   Electronically Signed   By: Bretta Bang M.D.   On: 06/30/2014 19:02   Dg Hip Complete Left  06/30/2014   CLINICAL DATA:  Fall.  Left leg deformity and pain.  EXAM: LEFT HIP - COMPLETE 2+ VIEW  COMPARISON:  08/02/2013.   FINDINGS: Frontal pelvis with AP and frog-leg lateral views of the left hip show a left intertrochanteric proximal femur fracture with varus angulation. No evidence for pubic ramus fracture. SI joints and symphysis pubis are unremarkable.  IMPRESSION: Intertrochanteric left femur fracture with varus angulation.   Electronically Signed   By: Kennith Center M.D.   On: 06/30/2014 19:03   Dg Hip Operative Left  07/02/2014   CLINICAL DATA:  Intra medullary nail placement for proximal femur fracture  EXAM: OPERATIVE LEFT HIP -two views  COMPARISON:  Preoperative evaluation pelvis and left hip June 30, 2014  FINDINGS: Frontal and lateral views were obtained. There is screw and nail fixation through an intertrochanteric femur fracture on the left. Alignment is near anatomic. The tip of the screws in the proximal femoral head. No dislocation.  IMPRESSION: Alignment near anatomic at the fracture site.  No dislocation.   Electronically Signed   By: Bretta Bang M.D.   On: 07/02/2014 13:52   Dg Ankle Complete Right  06/30/2014   CLINICAL DATA:  Bruising post trauma  EXAM: RIGHT ANKLE - COMPLETE 3+ VIEW  COMPARISON:  None.  FINDINGS: Frontal, oblique, and lateral views were obtained. There is a small avulsion arising from the lateral malleolus. There is a second small avulsion arising from the dorsal distal talus. There is no appreciable  joint effusion. There is mild generalized soft tissue swelling. The ankle mortise appears intact. There is a spur arising from the inferior calcaneus.  IMPRESSION: Small lateral malleolar avulsion. There is an avulsion also arising from the dorsal distal talus. Ankle mortise appears intact.   Electronically Signed   By: Bretta Bang M.D.   On: 06/30/2014 19:04   Ct Cervical Spine Wo Contrast  06/30/2014   CLINICAL DATA:  Larey Seat.  Neck pain.  EXAM: CT CERVICAL SPINE WITHOUT CONTRAST  TECHNIQUE: Multidetector CT imaging of the cervical spine was performed without intravenous  contrast. Multiplanar CT image reconstructions were also generated.  COMPARISON:  None.  FINDINGS: Advanced degenerative cervical spondylosis with multilevel disc disease and facet disease. The overall alignment is maintained. The spinal canal is fairly generous. There is a compression type fracture of the T1 vertebral body. There is also a laminar fracture on the left at C7.  No other definite fractures are identified. The C1-2 articulations are maintained. The dens is intact.  IMPRESSION: T1 compression fracture and left C7 laminar fracture.  Advanced degenerative cervical spondylosis with multilevel disc disease and facet disease.   Electronically Signed   By: Loralie Champagne M.D.   On: 06/30/2014 21:44   Dg Pelvis Portable  07/02/2014   CLINICAL DATA:  Postop ORIF of left proximal femur.  EXAM: PORTABLE PELVIS 1-2 VIEWS  COMPARISON:  Intraoperative fluoroscopic views 07/02/2014 pelvis and left hip radiographs 06/30/2014  FINDINGS: There are postoperative changes of intramedullary nail placement for a left intertrochanteric femur fracture. Fracture fragments are in near-anatomic alignment. No hardware complication is identified. No new fractures seen. Expected locules of gas are seen in the soft tissues about the proximal left femur.  IMPRESSION: Status post ORIF for left proximal femur fracture. No unexpected or acute findings.   Electronically Signed   By: Britta Mccreedy M.D.   On: 07/02/2014 14:58   Dg Chest Port 1 View  07/04/2014   CLINICAL DATA:  Leukocytosis.  EXAM: PORTABLE CHEST - 1 VIEW  COMPARISON:  Chest x-ray 06/30/2014.  FINDINGS: Lung volumes are normal. Large left upper lobe mass again noted, better demonstrated on prior chest CT 05/24/2014. Mild diffuse interstitial prominence is again noted, but appears be chronic and is similar to prior examinations. Diffuse peribronchial cuffing, also similar to the prior examination. There is some patchy ill-defined opacities developing in the right mid  to lower lung. No pleural effusions. No evidence of pulmonary edema. Mild cardiomegaly. The patient is rotated to the right on today's exam, resulting in distortion of the mediastinal contours and reduced diagnostic sensitivity and specificity for mediastinal pathology. Atherosclerosis in the thoracic aorta.  IMPRESSION: 1. Overall, the appearance the chest is very similar to recent prior examinations, with exception of some multifocal ill-defined opacities developing in the right mid to lower lung, concerning for developing bronchopneumonia. Close attention on followup studies is recommended. 2. Left upper lobe mass again noted, concerning for neoplasm.   Electronically Signed   By: Trudie Reed M.D.   On: 07/04/2014 09:00   Dg Foot Complete Right  06/30/2014   CLINICAL DATA:  Status post fall.  Bruising  EXAM: RIGHT FOOT COMPLETE - 3+ VIEW  COMPARISON:  None.  FINDINGS: The bones are osteopenic. There is mild degenerative changes at the first MTP joint. There is no evidence of arthropathy or other focal bone abnormality. Small plantar heel spur noted. Soft tissues are unremarkable. There is mild diffuse soft tissue swelling noted.  IMPRESSION: 1. Soft  tissue swelling. 2. Mild chronic degenerative change.   Electronically Signed   By: Signa Kell M.D.   On: 06/30/2014 19:05    Microbiology: Recent Results (from the past 240 hour(s))  SURGICAL PCR SCREEN     Status: None   Collection Time    06/30/14 10:56 PM      Result Value Ref Range Status   MRSA, PCR NEGATIVE  NEGATIVE Final   Staphylococcus aureus NEGATIVE  NEGATIVE Final   Comment:            The Xpert SA Assay (FDA     approved for NASAL specimens     in patients over 12 years of age),     is one component of     a comprehensive surveillance     program.  Test performance has     been validated by [facility] [employer] for patients greater     than or equal to 54 year old.     It is not intended     to diagnose infection nor to      guide or monitor treatment.  URINE CULTURE     Status: None   Collection Time    07/04/14  9:16 AM      Result Value Ref Range Status   Specimen Description URINE, RANDOM   Final   Special Requests NONE   Final   Culture  Setup Time     Final   Value: 07/04/2014 15:42     Performed at [facility] Count     Final   Value: NO GROWTH     Performed at [facility]   Culture     Final   Value: NO GROWTH     Performed at [facility]   Report Status 07/05/2014 FINAL   Final     Labs: Basic Metabolic Panel:  Recent Labs Lab 07/02/14 0435 07/03/14 0345 07/04/14 0445 07/05/14 0445 07/06/14 0425  NA 135* 134* 132* 135* 131*  K 3.7 3.8 3.8 4.3 4.1  CL 98 99 98 99 96  CO2 GLUCOSE 102* 121* 117* 121* 110*  BUN 23 19 25* 35* 37*  CREATININE 0.84 0.75 0.90 1.15* 1.01  CALCIUM 8.6 8.2* 8.0* 8.0* 8.0*   Liver Function Tests:  Recent Labs Lab 07/01/14 0632  AST 17  ALT 7  ALKPHOS 63  BILITOT 0.5  PROT 6.5  ALBUMIN 3.2*   No results found for this basename: LIPASE, AMYLASE,  in the last 168 hours No results found for this basename: AMMONIA,  in the last 168 hours CBC:  Recent Labs Lab 06/30/14 1807 07/01/14 0632  07/03/14 0345 07/04/14 0445 07/04/14 2130 07/05/14 0445 07/06/14 0425  WBC 8.4 9.7  < > 11.9* 12.8* 12.0* 12.7* 11.6*  NEUTROABS 5.8 6.9  --   --   --   --   --   --   HGB 10.6* 9.8*  < > 8.3* 7.1* 11.0* 10.5* 10.6*  HCT 32.7* 29.3*  < > 24.7* 21.3* 33.3* 30.7* 31.3*  MCV 91.1 89.6  < > 90.5 89.1 89.3 88.5 88.4  PLT 241 203  < > 197 196 189 205 250  < > = values in this interval not displayed. Cardiac Enzymes:  Recent Labs Lab 06/30/14 1807  TROPONINI <0.30   BNP: BNP (last 3 results) No results found for this basename: PROBNP,  in the last 8760 hours  CBG: No results found for this basename: GLUCAP,  in the last 168 hours     Signed:  [facility] MD [facility] 07/06/2014, 3:37  PM

## 2014-07-06 NOTE — Progress Notes (Signed)
Physical Therapy Treatment Patient Details Name: Kim Hodges MRN: [id number] DOB: [birth year] Today's Date: 07/06/2014    History of Present Illness 80 yo female adm 06/30/14 after mechanical fall resulting in left hip fx; Pt also  had fall ~3wks ago and C-spine xray this adm revealed C7 fx; Pt underwent IM nail on 07/02/14 per Dr. Lequita Halt.  PMHx: L1 fx, HTN, CKD    PT Comments    Pt will benefit from continued PT, progressing although slowly, still some pain issues; she is motivated and willing to work with PT  Follow Up Recommendations  SNF     Equipment Recommendations  None recommended by PT    Recommendations for Other Services       Precautions / Restrictions Precautions Precautions: Cervical;Fall Required Braces or Orthoses: Cervical Brace Cervical Brace: Hard collar;At all times Restrictions LLE Weight Bearing: Weight bearing as tolerated    Mobility  Bed Mobility Overal bed mobility: Needs Assistance Bed Mobility: Supine to Sit     Supine to sit: +2 for physical assistance;Max assist     General bed mobility comments: bed pad used to scoot, laterally, +2 for  trunk, pt able to initiate some LE movement today  Transfers Overall transfer level: Needs assistance Equipment used: Rolling walker (2 wheeled) Transfers: Sit to/from [employer] Transfers Sit to Stand: Max assist;+2 physical assistance;+2 safety/equipment Stand pivot transfers: +2 safety/equipment;+2 physical assistance;Max assist       General transfer comment: +2 for balance, wt shift to stand, safety; multi-modal cues throughout for hand placement, technique, etc   Ambulation/Gait                 Stairs            Wheelchair Mobility    Modified Rankin (Stroke Patients Only)       Balance Overall balance assessment: Needs assistance Sitting-balance support: Feet supported;Bilateral upper extremity supported;Single extremity supported Sitting balance-Leahy Scale:  Fair Sitting balance - Comments: pt able to sit EOB x 6 min without support; still leaning to right but improved  Postural control: Right lateral lean                          Cognition Arousal/Alertness: Awake/alert Behavior During Therapy: WFL for tasks assessed/performed         Memory: Decreased short-term memory              Exercises General Exercises - Lower Extremity Ankle Circles/Pumps: AROM;Both;10 reps Heel Slides: AROM;10 reps;Left    General Comments        Pertinent Vitals/Pain Pain Assessment: Faces Faces Pain Scale: Hurts little more Pain Location: L hip Pain Descriptors / Indicators: Aching Pain Intervention(s): Limited activity within patient's tolerance;Monitored during session;Premedicated before session;Repositioned;Ice applied    Home Living                      Prior Function            PT Goals (current goals can now be found in the care plan section) Acute Rehab PT Goals Patient Stated Goal: to walk, get stronger PT Goal Formulation: With patient Time For Goal Achievement: 07/10/14 Potential to Achieve Goals: Good Progress towards PT goals: Progressing toward goals    Frequency  Min 3X/week    PT Plan Current plan remains appropriate    Co-evaluation             End of Session Equipment Utilized  During Treatment: Gait belt Activity Tolerance: Patient tolerated treatment well Patient left: in chair;with call bell/phone within reach     Time: 0830-0850 PT Time Calculation (min): 20 min  Charges:  $Therapeutic Activity: 8-22 mins                    G Codes:      Kim Hodges 2014/07/14, 12:46 PM

## 2014-07-07 ENCOUNTER — Encounter: Payer: Self-pay | Admitting: Nurse Practitioner

## 2014-07-07 ENCOUNTER — Non-Acute Institutional Stay (SKILLED_NURSING_FACILITY): Payer: Medicare PPO | Admitting: Nurse Practitioner

## 2014-07-07 DIAGNOSIS — K5900 Constipation, unspecified: Secondary | ICD-10-CM

## 2014-07-07 DIAGNOSIS — S22010S Wedge compression fracture of first thoracic vertebra, sequela: Secondary | ICD-10-CM

## 2014-07-07 DIAGNOSIS — E039 Hypothyroidism, unspecified: Secondary | ICD-10-CM

## 2014-07-07 DIAGNOSIS — B0229 Other postherpetic nervous system involvement: Secondary | ICD-10-CM

## 2014-07-07 DIAGNOSIS — S72009S Fracture of unspecified part of neck of unspecified femur, sequela: Secondary | ICD-10-CM

## 2014-07-07 DIAGNOSIS — R918 Other nonspecific abnormal finding of lung field: Secondary | ICD-10-CM

## 2014-07-07 DIAGNOSIS — I2789 Other specified pulmonary heart diseases: Secondary | ICD-10-CM

## 2014-07-07 DIAGNOSIS — J189 Pneumonia, unspecified organism: Secondary | ICD-10-CM

## 2014-07-07 DIAGNOSIS — N182 Chronic kidney disease, stage 2 (mild): Secondary | ICD-10-CM

## 2014-07-07 DIAGNOSIS — S72002S Fracture of unspecified part of neck of left femur, sequela: Secondary | ICD-10-CM

## 2014-07-07 DIAGNOSIS — R222 Localized swelling, mass and lump, trunk: Secondary | ICD-10-CM

## 2014-07-07 DIAGNOSIS — I272 Pulmonary hypertension, unspecified: Secondary | ICD-10-CM

## 2014-07-07 DIAGNOSIS — K219 Gastro-esophageal reflux disease without esophagitis: Secondary | ICD-10-CM

## 2014-07-07 DIAGNOSIS — D62 Acute posthemorrhagic anemia: Secondary | ICD-10-CM

## 2014-07-07 DIAGNOSIS — IMO0002 Reserved for concepts with insufficient information to code with codable children: Secondary | ICD-10-CM

## 2014-07-07 DIAGNOSIS — I10 Essential (primary) hypertension: Secondary | ICD-10-CM

## 2014-07-07 HISTORY — DX: Constipation, unspecified: K59.00

## 2014-07-07 NOTE — Assessment & Plan Note (Signed)
Stable, continue Omeprazole 20mg daily.  

## 2014-07-07 NOTE — Assessment & Plan Note (Signed)
Update BMP.  

## 2014-07-07 NOTE — Assessment & Plan Note (Signed)
Per CT of the C-spine.. Patient denied any numbness and tingling in the upper extremities and minor complaints of some mild neck discomfort. Patient is moving her neck.  Patient was placed on Aspen collar. Ortho spine has seen patient and recommending to continue Aspen collar with outpatient follow up in 4 weeks.

## 2014-07-07 NOTE — Assessment & Plan Note (Addendum)
IM nail L intertrochanteric femur 07/02/14 Orthopedics: Dr Lequita Halt 009/06/2014 -Lovenox to ASA -ASA  for post op DVT/PE prophylaxis. Tramadol prn available in addition to Fentanyl patch

## 2014-07-07 NOTE — Assessment & Plan Note (Addendum)
Likely secondary to anemia of chronic disease and ABLA postop. No overt bleeding. Patient was transfused 2 units packed red blood cells with appropriate response. Patient's hemoglobin was 10.6 by day of discharge. Update CBC @ SNF [facility]. Continue Fe  qd

## 2014-07-07 NOTE — Assessment & Plan Note (Signed)
Patient is discharged on a low-dose Coreg.

## 2014-07-07 NOTE — Assessment & Plan Note (Signed)
Continued on home pain regimen of fentanyl patch and gabapentin.   

## 2014-07-07 NOTE — Progress Notes (Signed)
Patient ID: Kim Hodges, female   DOB: [birth year], 80 y.o.   MRN: [id number]   Code Status: DNR  No Known Allergies  Chief Complaint  Patient presents with  . Medical Management of Chronic Issues  . Hospitalization Follow-up    s/p left hip fx surgical repair.     HPI: Patient is a 80 y.o. female seen in the SNF at [facility] today for evaluation of s/p left hip fx surgical repair, PNA, and chronic medical conditions.     Hospitalized 06/30/2014-07/06/2014 for s/p fall and resultants of left hip fx, T1 compression fracture, C7 lamina fracture. She is s/p IM nail L intertrochanteric femur 07/02/14 Orthopedics: Dr Lequita Halt 009/06/2014 -Lovenox to ASA -ASA  for post op DVT/PE prophylaxis. T1 compression fracture of C7 lamina fracture-Aspen neck collar, pneumonia-complete 6 days of Augmentin at SNF [facility]   Problem List Items Addressed This Visit   Acute blood loss anemia     Likely secondary to anemia of chronic disease and ABLA postop. No overt bleeding. Patient was transfused 2 units packed red blood cells with appropriate response. Patient's hemoglobin was 10.6 by day of discharge. Update CBC @ SNF [facility]. Continue Fe  qd     Chronic kidney disease, stage II (mild) (Chronic)     Update BMP    Closed left hip fracture - Primary     IM nail L intertrochanteric femur 07/02/14 Orthopedics: Dr Lequita Halt 009/06/2014 -Lovenox to ASA -ASA  for post op DVT/PE prophylaxis. Tramadol prn available in addition to Fentanyl patch     GERD (gastroesophageal reflux disease)     Stable, continue Omeprazole  daily.      HCAP (healthcare-associated pneumonia)     healthcare associated pneumonia versus aspiration pneumonia  Patient noted to have a leukocytosis that was worsening over the past one to 2 days postoperatively. Chest x-ray which came back with multifocal ill-defined opacities developing in the right mid to lower lung concerning for developing bronchopneumonia.  Patient was asymptomatic however patient status post recent surgery and possible concern for aspiration pneumonia. Urine Legionella antigen negative. Urine pneumococcus antigen negative. Patient was placed empirically on IV Zosyn. Patient be discharged on 6 more days of oral Augmentin to complete 8 day course of antibiotic therapy the patient be discharged in stable and improved condition. Continue Mucinex  bid.      Hypothyroidism (Chronic)     Takes Levothyroxine daily. 03/23/14 TSH 2.553. 05/20/14 TSH 3.224      Lung mass; LUL (Chronic)     Patient with a prior history of a pulmonary mass suspicious for malignancy however family decided not to pursue any further workup at this time. Patient is not hypoxic. Patient is not tachypneic. Continue pulmonary toilet.      Post herpetic neuralgia (Chronic)     Continued on home pain regimen of fentanyl patch and gabapentin.     Pulmonary hypertension      Patient had cardiac catheterization in 2012 with normal coronaries.patient had a 2-D echo in September of 2005 with EF of 55-65% with no wall motion abnormalities. Mild aortic valve thickness, mild to moderate mitral annular calcification consistent with mild mitral stenosis left atrium was mildly dilated. Repeat 2-D echo with EF of 60-65%, mild aortic valve stenosis, severe pulmonary hypertension, grade 2 diastolic dysfunction.    Traumatic compression fracture of T1 thoracic vertebra     Per CT of the C-spine.. Patient denied any numbness and tingling in the upper extremities and minor complaints  of some mild neck discomfort. Patient is moving her neck.  Patient was placed on Aspen collar. Ortho spine has seen patient and recommending to continue Aspen collar with outpatient follow up in 4 weeks.      Unspecified constipation     Takes Colace  bid and Senokot II daily.     Unspecified essential hypertension (Chronic)     Patient is discharged on a low-dose Coreg.         Review of Systems: Review of Systems  Constitutional: Positive for malaise/fatigue. Negative for fever, chills, weight loss and diaphoresis.  HENT: Positive for hearing loss. Negative for congestion, ear discharge, ear pain, nosebleeds, sore throat and tinnitus.        Mild HOH  Eyes: Negative for blurred vision, double vision, pain, discharge and redness.       Glaucoma R+L  Respiratory: Positive for cough. Negative for hemoptysis, sputum production, shortness of breath, wheezing and stridor.   Cardiovascular: Negative for chest pain, palpitations, orthopnea, claudication, leg swelling and PND.  Gastrointestinal: Negative for heartburn, nausea, vomiting, abdominal pain, diarrhea, constipation, blood in stool and melena.  Genitourinary: Positive for frequency. Negative for dysuria, urgency, hematuria and flank pain.  Musculoskeletal: Positive for falls, joint pain and neck pain. Negative for back pain and myalgias.       Ambulates with walker. C/o upper left scapular area pain for a 5-6 days following a fall in her bathroom a few days earlier. Constant pain in moderate severity. Woke up a few times last night due to her discomfort. Pain is aching in nature and radiates acrose her upper thoracic spine to the  right upper scapula at the same level and to the left arm.  Neck and left hip pain.   Skin: Negative for itching and rash.       Let hip surgical sites intact, mild inflammation, no s/s of infection.   Neurological: Negative for dizziness, tingling, tremors, sensory change, speech change, focal weakness, seizures, loss of consciousness, weakness and headaches.  Endo/Heme/Allergies: Negative for environmental allergies and polydipsia. Does not bruise/bleed easily.  Psychiatric/Behavioral: Positive for memory loss. Negative for depression, suicidal ideas, hallucinations and substance abuse. The patient is not nervous/anxious and does not have insomnia.      Past Medical History   Diagnosis Date  . Chronic kidney disease, stage II (mild) 12/25/2012  . Unspecified arthropathy, lower leg 09/11/2011  . Swelling of limb 09/10/2012  . Other and unspecified hyperlipidemia 07/26/2011  . Actinic keratosis 07/26/2011  . Abnormality of gait 07/26/2011  . Hyperglycemia 07/26/2011  . Tear film insufficiency, unspecified 12/29/2009  . Other malaise and fatigue 12/29/2009  . Unspecified hearing loss 06/30/2009  . Unspecified hypothyroidism 12/15/2008  . Unspecified essential hypertension 12/15/2008  . Unspecified glaucoma 12/15/2008    both eyes  . Weak 08/02/13  . Abnormality of gait 08/02/13  . Hyponatremia 08/02/13  . Herpes zoster 08/02/13  . Post herpetic neuralgia 08/02/13  . Cerebral atrophy 08/02/13  . Cerebrovascular disease 08/02/13  . Atherosclerosis 08/02/13  . Abnormal chest x-ray 08/02/13    08/02/13 nodule in left mid to upper lung  . Murmur, cardiac 08/02/13    aortic ejection  . Fracture lumbar vertebra-closed 08/02/13    L1  . Memory changes 07/03/13  . Lung mass 07/01/2014   Past Surgical History  Procedure Laterality Date  . Small intestine surgery  1957  . Cardiac catheterization  2004  . Gallbladder surgery  2006  . Cholecystectomy    .  Femur im nail Left 07/02/2014    Procedure: INTRAMEDULLARY (IM) NAIL FEMORAL;  Surgeon: Loanne Drilling, MD;  Location: [facility];  Service: Orthopedics;  Laterality: Left;   Social History:   reports that she has never smoked. She has never used smokeless tobacco. She reports that she does not drink alcohol or use illicit drugs.  Family History  Problem Relation Age of Onset  . Cancer Sister   . Cancer Brother     pancreatic  . Cancer Son     lung  . Cancer Brother     pancreatic    Medications: Patient's Medications  New Prescriptions   No medications on file  Previous Medications   ACETAMINOPHEN (TYLENOL ARTHRITIS PAIN) 650 MG CR TABLET    Take 650 mg by mouth 3 (three) times daily as needed for pain. May  also take one tablet every 6 hours as needed for pain   AMOXICILLIN-CLAVULANATE (AUGMENTIN) 875-125 MG PER TABLET    Take 1 tablet by mouth 2 (two) times daily. Take for 6 days then stop   CALCIUM CARB-CHOLECALCIFEROL (CALCIUM 500 +D) 500-400 MG-UNIT TABS    Take 1 tablet by mouth daily.   CARVEDILOL (COREG) 3.125 MG TABLET    Take 1 tablet (3.125 mg total) by mouth 2 (two) times daily with a meal.   DOCUSATE SODIUM 100 MG CAPS    Take 100 mg by mouth 2 (two) times daily.   DORZOLAMIDE-TIMOLOL (COSOPT) 22.3-6.8 MG/ML OPHTHALMIC SOLUTION    Place 1 drop into both eyes 2 (two) times daily.    ENOXAPARIN (LOVENOX) 30 MG/0.3ML INJECTION    Inject 0.3 mLs (30 mg total) into the skin daily. Take injections for seven more days and then switch over to a 325 mg Aspirin daily for three more weeks.   FEEDING SUPPLEMENT, ENSURE COMPLETE, (ENSURE COMPLETE) LIQD    Take 237 mLs by mouth 2 (two) times daily between meals.   FENTANYL (DURAGESIC - DOSED MCG/HR) 12 MCG/HR    Place 1 patch (12.5 mcg total) onto the skin every 3 (three) days.   FERROUS SULFATE 325 (65 FE) MG TABLET    Take 1 tablet (325 mg total) by mouth daily with breakfast.   GABAPENTIN (NEURONTIN) 100 MG CAPSULE    Take 100 mg by mouth 3 (three) times daily.    GUAIFENESIN (MUCINEX) 600 MG 12 HR TABLET    Take 1 tablet (600 mg total) by mouth 2 (two) times daily. Take for 6 days then stop.   LEVOTHYROXINE (SYNTHROID, LEVOTHROID) 75 MCG TABLET    Take 75 mcg by mouth daily before breakfast.   OMEPRAZOLE (PRILOSEC) 20 MG CAPSULE    Take 20 mg by mouth daily.   SENNA (SENOKOT) 8.6 MG TABS TABLET    Take 1 tablet (8.6 mg total) by mouth 2 (two) times daily.   SPECIALTY VITAMINS PRODUCTS (ICAPS LUTEIN-ZEAXANTHIN PO)    Take 1 tablet by mouth daily.   TRAMADOL (ULTRAM) 50 MG TABLET    Take 1-2 tablets (50-100 mg total) by mouth every 6 (six) hours as needed (mild to moderate pain).  Modified Medications   No medications on file  Discontinued  Medications   No medications on file     Physical Exam: Physical Exam  Constitutional: She is oriented to person, place, and time. She appears well-developed and well-nourished. No distress.  HENT:  Head: Normocephalic and atraumatic.  Right Ear: External ear normal.  Left Ear: External ear normal.  Nose: Nose normal.  Mouth/Throat: Oropharynx is clear and moist. No oropharyngeal exudate.  Eyes: Conjunctivae and EOM are normal. Pupils are equal, round, and reactive to light. Right eye exhibits no discharge. Left eye exhibits no discharge. No scleral icterus.  Neck: Normal range of motion. Neck supple. No JVD present. No tracheal deviation present. No thyromegaly present.  Cardiovascular: Normal rate and regular rhythm.   Murmur heard. 3/6  Pulmonary/Chest: Effort normal. No stridor. No respiratory distress. She has no wheezes. She has no rales. She exhibits no tenderness.  Abdominal: Soft. Bowel sounds are normal. She exhibits no distension. There is no tenderness. There is no rebound and no guarding.  Genitourinary: Guaiac negative stool.  Musculoskeletal: Normal range of motion. She exhibits edema and tenderness.  Ambulates with walker. C/o upper left scapular area pain for a 5-6 days following a fall in her bathroom a few days earlier. Constant pain in moderate severity. Woke up a few times last night due to her discomfort. Pain is aching in nature and radiates acrose her upper thoracic spine to the  right upper scapula at the same level and to the left arm. Left hip and neck pain. Mild edema entire left leg.   Lymphadenopathy:    She has no cervical adenopathy.  Neurological: She is alert and oriented to person, place, and time. She displays normal reflexes. No cranial nerve deficit. She exhibits normal muscle tone. Coordination normal.  Skin: Skin is warm and dry. No rash noted. She is not diaphoretic. No erythema. No pallor.  Let hip surgical sites intact, mild inflammation, no s/s  of infection.    Psychiatric: She has a normal mood and affect. Her behavior is normal. Judgment and thought content normal.    Filed Vitals:   07/07/14 1129  BP: 122/62  Pulse: 74  Temp: 98.7 F (37.1 C)  TempSrc: Tympanic  Resp: 20      Labs reviewed: Basic Metabolic Panel:  Recent Labs  54/09/81 05/20/14  07/02/14 0433  07/04/14 0445 07/05/14 0445 07/06/14 0425  NA  --  135*  < >  --   < > 132* 135* 131*  K  --  4.1  < >  --   < > 3.8 4.3 4.1  CL  --   --   < >  --   < > 98 99 96  CO2  --   --   < >  --   < > GLUCOSE  --   --   < >  --   < > 117* 121* 110*  BUN  --  25*  < >  --   < > 25* 35* 37*  CREATININE  --  1.0  < >  --   < > 0.90 1.15* 1.01  CALCIUM  --   --   < >  --   < > 8.0* 8.0* 8.0*  TSH 2.55 3.22  --  1.990  --   --   --   --   < > = values in this interval not displayed. Liver Function Tests:  Recent Labs  08/02/13 1630 09/22/13 05/20/14 07/01/14 0632  AST ALT ALKPHOS 54 55 67 63  BILITOT 0.5  --   --  0.5  PROT 7.2  --   --  6.5  ALBUMIN 3.7  --   --  3.2*   CBC:  Recent Labs  08/02/13 1630  06/30/14 1807  07/01/14 1610  07/04/14 2130 07/05/14 0445 07/06/14 0425  WBC 10.0  < > 8.4 9.7  < > 12.0* 12.7* 11.6*  NEUTROABS 6.7  --  5.8 6.9  --   --   --   --   HGB 12.4  < > 10.6* 9.8*  < > 11.0* 10.5* 10.6*  HCT 36.3  < > 32.7* 29.3*  < > 33.3* 30.7* 31.3*  MCV 87.5  < > 91.1 89.6  < > 89.3 88.5 88.4  PLT 306  < > 241 203  < > 189 205 250  < > = values in this interval not displayed.  Past Procedures:  09/29/13 Echocardiogram: moderate AI, moderate to severe MR, moderate TR, moderate pulmonary hypertension, mild LLH, hyperdynamic LV systolic function, EF 75%.    05/24/14 CT chest w/o contrast:  IMPRESSION:  Enlarging masslike density in the area of prior linear scarring on  2009 chest CT. Findings are concerning for possible scar carcinoma.  This could be further evaluated with PET CT or tissue  biopsy if felt  clinically indicated.  Trace left pleural effusion.  Mild cardiomegaly.  Small hiatal hernia.  Borderline low right paratracheal lymph node.  06/2014 hospital:   CT C-spine 06/30/2014  X-ray of the left hip 06/30/2014  X-ray of the right foot 06/30/2014  Chest x-ray 06/30/2001, 07/04/2014  X-ray of the right ankle 06/30/2014  2 d echo 07/01/14   Assessment/Plan Closed left hip fracture IM nail L intertrochanteric femur 07/02/14 Orthopedics: Dr Lequita Halt 009/06/2014 -Lovenox to ASA -ASA  for post op DVT/PE prophylaxis. Tramadol prn available in addition to Fentanyl patch   Pulmonary hypertension  Patient had cardiac catheterization in 2012 with normal coronaries.patient had a 2-D echo in September of 2005 with EF of 55-65% with no wall motion abnormalities. Mild aortic valve thickness, mild to moderate mitral annular calcification consistent with mild mitral stenosis left atrium was mildly dilated. Repeat 2-D echo with EF of 60-65%, mild aortic valve stenosis, severe pulmonary hypertension, grade 2 diastolic dysfunction.  Traumatic compression fracture of T1 thoracic vertebra Per CT of the C-spine.. Patient denied any numbness and tingling in the upper extremities and minor complaints of some mild neck discomfort. Patient is moving her neck.  Patient was placed on Aspen collar. Ortho spine has seen patient and recommending to continue Aspen collar with outpatient follow up in 4 weeks.    HCAP (healthcare-associated pneumonia) healthcare associated pneumonia versus aspiration pneumonia  Patient noted to have a leukocytosis that was worsening over the past one to 2 days postoperatively. Chest x-ray which came back with multifocal ill-defined opacities developing in the right mid to lower lung concerning for developing bronchopneumonia. Patient was asymptomatic however patient status post recent surgery and possible concern for aspiration pneumonia. Urine Legionella  antigen negative. Urine pneumococcus antigen negative. Patient was placed empirically on IV Zosyn. Patient be discharged on 6 more days of oral Augmentin to complete 8 day course of antibiotic therapy the patient be discharged in stable and improved condition. Continue Mucinex  bid.    Lung mass; LUL Patient with a prior history of a pulmonary mass suspicious for malignancy however family decided not to pursue any further workup at this time. Patient is not hypoxic. Patient is not tachypneic. Continue pulmonary toilet.    Post herpetic neuralgia Continued on home pain regimen of fentanyl patch and gabapentin.   Hypothyroidism Takes Levothyroxine daily. 03/23/14 TSH 2.553. 05/20/14 TSH 3.224    Unspecified essential hypertension Patient is discharged  on a low-dose Coreg.   GERD (gastroesophageal reflux disease) Stable, continue Omeprazole  daily.    Acute blood loss anemia Likely secondary to anemia of chronic disease and ABLA postop. No overt bleeding. Patient was transfused 2 units packed red blood cells with appropriate response. Patient's hemoglobin was 10.6 by day of discharge. Update CBC @ SNF [facility]. Continue Fe  qd   Chronic kidney disease, stage II (mild) Update BMP  Unspecified constipation Takes Colace  bid and Senokot II daily.     Family/ Staff Communication: observe the patient.   Goals of Care: SNF  Labs/tests ordered: CBC and BMP

## 2014-07-07 NOTE — Assessment & Plan Note (Signed)
Patient had cardiac catheterization in 2012 with normal coronaries.patient had a 2-D echo in September of 2005 with EF of 55-65% with no wall motion abnormalities. Mild aortic valve thickness, mild to moderate mitral annular calcification consistent with mild mitral stenosis left atrium was mildly dilated. Repeat 2-D echo with EF of 60-65%, mild aortic valve stenosis, severe pulmonary hypertension, grade 2 diastolic dysfunction.  

## 2014-07-07 NOTE — Assessment & Plan Note (Signed)
Takes Colace 100mg bid and Senokot II daily.   

## 2014-07-07 NOTE — Progress Notes (Signed)
Pt returned to [facility], ( Rehab unit ) on 07/06/14 via [facility] transport. Pt / family were in agreement with d/c plan. NSG reviewed d/c summary,scripts,avs. Scripts included in d/c packet.  Cori Razor LCSW [phone]

## 2014-07-07 NOTE — Assessment & Plan Note (Signed)
Patient with a prior history of a pulmonary mass suspicious for malignancy however family decided not to pursue any further workup at this time. Patient is not hypoxic. Patient is not tachypneic. Continue pulmonary toilet.    

## 2014-07-07 NOTE — Assessment & Plan Note (Signed)
Takes Levothyroxine 75mcg daily. 03/23/14 TSH 2.553. 05/20/14 TSH 3.224   

## 2014-07-07 NOTE — Assessment & Plan Note (Addendum)
healthcare associated pneumonia versus aspiration pneumonia  Patient noted to have a leukocytosis that was worsening over the past one to 2 days postoperatively. Chest x-ray which came back with multifocal ill-defined opacities developing in the right mid to lower lung concerning for developing bronchopneumonia. Patient was asymptomatic however patient status post recent surgery and possible concern for aspiration pneumonia. Urine Legionella antigen negative. Urine pneumococcus antigen negative. Patient was placed empirically on IV Zosyn. Patient be discharged on 6 more days of oral Augmentin to complete 8 day course of antibiotic therapy the patient be discharged in stable and improved condition. Continue Mucinex  bid.

## 2014-07-08 ENCOUNTER — Encounter: Payer: Self-pay | Admitting: Internal Medicine

## 2014-07-08 ENCOUNTER — Non-Acute Institutional Stay: Payer: Medicare PPO | Admitting: Internal Medicine

## 2014-07-08 DIAGNOSIS — J189 Pneumonia, unspecified organism: Secondary | ICD-10-CM | POA: Diagnosis not present

## 2014-07-08 DIAGNOSIS — R5381 Other malaise: Secondary | ICD-10-CM | POA: Diagnosis not present

## 2014-07-08 DIAGNOSIS — S22019D Unspecified fracture of first thoracic vertebra, subsequent encounter for fracture with routine healing: Secondary | ICD-10-CM

## 2014-07-08 DIAGNOSIS — D62 Acute posthemorrhagic anemia: Secondary | ICD-10-CM

## 2014-07-08 DIAGNOSIS — D638 Anemia in other chronic diseases classified elsewhere: Secondary | ICD-10-CM | POA: Diagnosis not present

## 2014-07-08 DIAGNOSIS — Z9181 History of falling: Secondary | ICD-10-CM | POA: Diagnosis not present

## 2014-07-08 DIAGNOSIS — R9389 Abnormal findings on diagnostic imaging of other specified body structures: Secondary | ICD-10-CM | POA: Diagnosis not present

## 2014-07-08 DIAGNOSIS — R531 Weakness: Secondary | ICD-10-CM

## 2014-07-08 DIAGNOSIS — R5383 Other fatigue: Secondary | ICD-10-CM

## 2014-07-08 DIAGNOSIS — S12690D Other displaced fracture of seventh cervical vertebra, subsequent encounter for fracture with routine healing: Secondary | ICD-10-CM

## 2014-07-08 DIAGNOSIS — S72009D Fracture of unspecified part of neck of unspecified femur, subsequent encounter for closed fracture with routine healing: Secondary | ICD-10-CM | POA: Diagnosis not present

## 2014-07-08 DIAGNOSIS — S72142D Displaced intertrochanteric fracture of left femur, subsequent encounter for closed fracture with routine healing: Secondary | ICD-10-CM

## 2014-07-08 DIAGNOSIS — IMO0002 Reserved for concepts with insufficient information to code with codable children: Secondary | ICD-10-CM | POA: Diagnosis not present

## 2014-07-08 NOTE — Progress Notes (Signed)
Patient ID: Kim Hodges, female   DOB: [birth year], 80 y.o.   MRN: [id number]    Location:  [facility]   Place of Service: SNF (31)  Extended Emergency Contact Information Primary Emergency Contact: Wesolowski,Gerald Address: [address]          [place] [address] [place] Home Phone: [phone] Mobile Phone: [phone] Relation: Son Secondary Emergency Contact: Wayna Chalet [place] Mobile Phone: [phone] Relation: None   Chief Complaint  Patient presents with  . New Admit To SNF    recent left hip fracture and fracture of the C7 veretebra    HPI:  Patient is a 80 y.o. female seen in the SNF at [facility] today for evaluation of s/p left hip fx surgical repair, PNA, and chronic medical conditions.   Hospitalized 06/30/2014-07/06/2014 for s/p fall and resultants of left hip fx, T1 compression fracture, C7 lamina fracture. She is s/p IM nail L intertrochanteric femur 07/02/14 Orthopedics: Dr Lequita Halt 009/06/2014 -Lovenox to ASA -ASA  for post op DVT/PE prophylaxis. T1 compression fracture and C7 lamina fracture-Aspen neck collar, pneumonia-complete 6 days of Augmentin at [facility]    Past Medical History  Diagnosis Date  . Chronic kidney disease, stage II (mild) 12/25/2012  . Unspecified arthropathy, lower leg 09/11/2011  . Swelling of limb 09/10/2012  . Other and unspecified hyperlipidemia 07/26/2011  . Actinic keratosis 07/26/2011  . Abnormality of gait 07/26/2011  . Hyperglycemia 07/26/2011  . Tear film insufficiency, unspecified 12/29/2009  . Other malaise and fatigue 12/29/2009  . Unspecified hearing loss 06/30/2009  . Unspecified hypothyroidism 12/15/2008  . Unspecified essential hypertension 12/15/2008  . Unspecified glaucoma 12/15/2008    both eyes  . Weak 08/02/13  . Hyponatremia 08/02/13  . Herpes zoster 08/02/13  . Post herpetic neuralgia 08/02/13  . Cerebral atrophy 08/02/13  . Cerebrovascular  disease 08/02/13  . Atherosclerosis 08/02/13  . Abnormal chest x-ray 08/02/13    08/02/13 nodule in left mid to upper lung  . Murmur, cardiac 08/02/13    aortic ejection  . Fracture lumbar vertebra-closed 08/02/13    L1  . Memory changes 07/03/13  . Lung mass 07/01/2014  . Intertrochanteric fracture of left femur 06/30/14  . Fracture of T1 vertebra 06/30/14  . Fracture of C7 vertebra, closed 06/30/14  . History of fall 06/30/14  . GERD (gastroesophageal reflux disease) 06/03/2014  . Unspecified constipation 07/07/2014    Past Surgical History  Procedure Laterality Date  . Small intestine surgery  1957  . Cardiac catheterization  2004  . Gallbladder surgery  2006  . Cholecystectomy    . Femur im nail Left 07/02/2014    Procedure: INTRAMEDULLARY (IM) NAIL FEMORAL;  Surgeon: Loanne Drilling, MD;  Location: [facility];  Service: Orthopedics;  Laterality: Left;    History   Social History  . Marital Status: Widowed    Spouse Name: N/A    Number of Children: N/A  . Years of Education: N/A   Occupational History  . retired Runner, broadcasting/film/video    Social History Main Topics  . Smoking status: Never Smoker   . Smokeless tobacco: Never Used  . Alcohol Use: No  . Drug Use: No  . Sexual Activity: No   Other Topics Concern  . Not on file   Social History Narrative   Patient lives at [facility] [place]   Walks with walker   DNR, POA, Living Will   Never smoked  Alcohol none   Exercise none              reports that she has never smoked. She has never used smokeless tobacco. She reports that she does not drink alcohol or use illicit drugs.  Immunization History  Administered Date(s) Administered  . Influenza Whole 07/22/2012, 08/05/2013  . Pneumococcal Polysaccharide-23 10/22/2006  . Td 10/22/2004    No Known Allergies  Medications: Patient's Medications  New Prescriptions   No medications on file  Previous Medications   ACETAMINOPHEN (TYLENOL ARTHRITIS PAIN) 650 MG CR TABLET     Take 650 mg by mouth 3 (three) times daily as needed for pain. May also take one tablet every 6 hours as needed for pain   AMOXICILLIN-CLAVULANATE (AUGMENTIN) 875-125 MG PER TABLET    Take 1 tablet by mouth 2 (two) times daily. Take for 6 days then stop   CALCIUM CARB-CHOLECALCIFEROL (CALCIUM 500 +D) 500-400 MG-UNIT TABS    Take 1 tablet by mouth daily.   CARVEDILOL (COREG) 3.125 MG TABLET    Take 1 tablet (3.125 mg total) by mouth 2 (two) times daily with a meal.   DOCUSATE SODIUM 100 MG CAPS    Take 100 mg by mouth 2 (two) times daily.   DORZOLAMIDE-TIMOLOL (COSOPT) 22.3-6.8 MG/ML OPHTHALMIC SOLUTION    Place 1 drop into both eyes 2 (two) times daily.    ENOXAPARIN (LOVENOX) 30 MG/0.3ML INJECTION    Inject 0.3 mLs (30 mg total) into the skin daily. Take injections for seven more days and then switch over to a 325 mg Aspirin daily for three more weeks.   FEEDING SUPPLEMENT, ENSURE COMPLETE, (ENSURE COMPLETE) LIQD    Take 237 mLs by mouth 2 (two) times daily between meals.   FENTANYL (DURAGESIC - DOSED MCG/HR) 12 MCG/HR    Place 1 patch (12.5 mcg total) onto the skin every 3 (three) days.   FERROUS SULFATE 325 (65 FE) MG TABLET    Take 1 tablet (325 mg total) by mouth daily with breakfast.   GABAPENTIN (NEURONTIN) 100 MG CAPSULE    Take 100 mg by mouth 3 (three) times daily.    GUAIFENESIN (MUCINEX) 600 MG 12 HR TABLET    Take 1 tablet (600 mg total) by mouth 2 (two) times daily. Take for 6 days then stop.   HYDROCODONE-ACETAMINOPHEN (NORCO/VICODIN) 5-325 MG PER TABLET       LEVOTHYROXINE (SYNTHROID, LEVOTHROID) 75 MCG TABLET    Take 75 mcg by mouth daily before breakfast.   OMEPRAZOLE (PRILOSEC) 20 MG CAPSULE    Take 20 mg by mouth daily.   SENNA (SENOKOT) 8.6 MG TABS TABLET    Take 1 tablet (8.6 mg total) by mouth 2 (two) times daily.   SPECIALTY VITAMINS PRODUCTS (ICAPS LUTEIN-ZEAXANTHIN PO)    Take 1 tablet by mouth daily.   TRAMADOL (ULTRAM) 50 MG TABLET    Take 1-2 tablets (50-100 mg total)  by mouth every 6 (six) hours as needed (mild to moderate pain).  Modified Medications   No medications on file  Discontinued Medications   No medications on file     Review of Systems  Constitutional: Negative for fever, chills and diaphoresis.  HENT: Positive for hearing loss. Negative for congestion, ear discharge, ear pain, nosebleeds, sore throat and tinnitus.        Mild HOH  Eyes: Negative for pain, discharge and redness.       Glaucoma R+L  Respiratory: Positive for cough. Negative for shortness of breath,  wheezing and stridor.   Cardiovascular: Negative for chest pain, palpitations and leg swelling.  Gastrointestinal: Negative for nausea, vomiting, abdominal pain, diarrhea, constipation and blood in stool.  Endocrine: Negative for polydipsia.  Genitourinary: Positive for frequency. Negative for dysuria, urgency, hematuria and flank pain.  Musculoskeletal: Positive for neck pain. Negative for myalgias and back pain.       Ambulates with walker. C/o upper left scapular area pain for a 5-6 days following a fall in her bathroom a few days earlier. Constant pain in moderate severity. Woke up a few times last night due to her discomfort. Pain is aching in nature and radiates acrose her upper thoracic spine to the  right upper scapula at the same level and to the left arm.  Neck and left hip pain.   Skin: Negative for rash.       Let hip surgical sites intact, mild inflammation, no s/s of infection.   Allergic/Immunologic: Negative for environmental allergies.  Neurological: Negative for dizziness, tremors, seizures, weakness and headaches.  Hematological: Does not bruise/bleed easily.  Psychiatric/Behavioral: Negative for suicidal ideas and hallucinations. The patient is not nervous/anxious.     Filed Vitals:   07/08/14 1331  BP: 148/60  Pulse: 74  Temp: 99.2 F (37.3 C)  Resp: 20  Height:  (1.499 m)  Weight: 113 lb (51.256 kg)   Body mass index is 22.81  kg/(m^2).  Physical Exam  Constitutional: She is oriented to person, place, and time. She appears well-developed and well-nourished. No distress.  HENT:  Head: Normocephalic and atraumatic.  Right Ear: External ear normal.  Left Ear: External ear normal.  Nose: Nose normal.  Mouth/Throat: Oropharynx is clear and moist. No oropharyngeal exudate.  Eyes: Conjunctivae and EOM are normal. Pupils are equal, round, and reactive to light. Right eye exhibits no discharge. Left eye exhibits no discharge. No scleral icterus.  Neck: Normal range of motion. Neck supple. No JVD present. No tracheal deviation present. No thyromegaly present.  Cardiovascular: Normal rate and regular rhythm.   Murmur heard. 3/6  Pulmonary/Chest: Effort normal. No stridor. No respiratory distress. She has no wheezes. She has no rales. She exhibits no tenderness.  Abdominal: Soft. Bowel sounds are normal. She exhibits no distension. There is no tenderness. There is no rebound and no guarding.  Genitourinary: Guaiac negative stool.  Musculoskeletal: Normal range of motion. She exhibits edema and tenderness.  Ambulates with walker. C/o upper left scapular area pain for a 5-6 days following a fall in her bathroom a few days earlier. Constant pain in moderate severity. Woke up a few times last night due to her discomfort. Pain is aching in nature and radiates acrose her upper thoracic spine to the  right upper scapula at the same level and to the left arm. Left hip and neck pain. Mild edema entire left leg.   Lymphadenopathy:    She has no cervical adenopathy.  Neurological: She is alert and oriented to person, place, and time. She displays normal reflexes. No cranial nerve deficit. She exhibits normal muscle tone. Coordination normal.  Skin: Skin is warm and dry. No rash noted. She is not diaphoretic. No erythema. No pallor.  Let hip surgical sites intact, mild inflammation, no s/s of infection.    Psychiatric: She has a normal  mood and affect. Her behavior is normal. Judgment and thought content normal.     Labs reviewed: Admission on 06/30/2014, Discharged on 07/06/2014  No results displayed because visit has over 200 results.  Nursing Home on 05/20/2014  Component Date Value Ref Range Status  . Hemoglobin 05/20/2014 11.9* 12.0 - 16.0 g/dL Final  . HCT 16/07/9603 34* 36 - 46 % Final  . Platelets 05/20/2014 281  150 - 399 K/L Final  . WBC 05/20/2014 8.2   Final  . Glucose 05/20/2014 101   Final  . BUN 05/20/2014 25* 4 - 21 mg/dL Final  . Creatinine 54/06/8118 1.0  0.5 - 1.1 mg/dL Final  . Potassium 14/78/2956 4.1  3.4 - 5.3 mmol/L Final  . Sodium 05/20/2014 135* 137 - 147 mmol/L Final  . Alkaline Phosphatase 05/20/2014 67  25 - 125 U/L Final  . ALT 05/20/2014 8  7 - 35 U/L Final  . AST 05/20/2014 18  13 - 35 U/L Final  . Bilirubin, Total 05/20/2014 0.5   Final  . TSH 05/20/2014 3.22  0.41 - 5.90 uIU/mL Final    Dg Chest 1 View  06/30/2014   CLINICAL DATA:  Pain post trauma  EXAM: CHEST - 1 VIEW  COMPARISON:  Chest CT May 24, 2014  FINDINGS: Mass in the left upper lobe is again noted measuring approximately 2.3 x 1.8 cm. Elsewhere, lungs are clear. Heart is upper normal in size with pulmonary vascularity within normal limits. No adenopathy. No pneumothorax. No fractures apparent.  IMPRESSION: Right upper lobe mass again noted, suspicious for neoplasm. Elsewhere lungs clear. No pneumothorax apparent.   Electronically Signed   By: Bretta Bang M.D.   On: 06/30/2014 19:02   Dg Hip Complete Left  06/30/2014   CLINICAL DATA:  Fall.  Left leg deformity and pain.  EXAM: LEFT HIP - COMPLETE 2+ VIEW  COMPARISON:  08/02/2013.  FINDINGS: Frontal pelvis with AP and frog-leg lateral views of the left hip show a left intertrochanteric proximal femur fracture with varus angulation. No evidence for pubic ramus fracture. SI joints and symphysis pubis are unremarkable.  IMPRESSION: Intertrochanteric left femur fracture  with varus angulation.   Electronically Signed   By: Kennith Center M.D.   On: 06/30/2014 19:03   Dg Hip Operative Left  07/02/2014   CLINICAL DATA:  Intra medullary nail placement for proximal femur fracture  EXAM: OPERATIVE LEFT HIP -two views  COMPARISON:  Preoperative evaluation pelvis and left hip June 30, 2014  FINDINGS: Frontal and lateral views were obtained. There is screw and nail fixation through an intertrochanteric femur fracture on the left. Alignment is near anatomic. The tip of the screws in the proximal femoral head. No dislocation.  IMPRESSION: Alignment near anatomic at the fracture site.  No dislocation.   Electronically Signed   By: Bretta Bang M.D.   On: 07/02/2014 13:52   Dg Ankle Complete Right  06/30/2014   CLINICAL DATA:  Bruising post trauma  EXAM: RIGHT ANKLE - COMPLETE 3+ VIEW  COMPARISON:  None.  FINDINGS: Frontal, oblique, and lateral views were obtained. There is a small avulsion arising from the lateral malleolus. There is a second small avulsion arising from the dorsal distal talus. There is no appreciable joint effusion. There is mild generalized soft tissue swelling. The ankle mortise appears intact. There is a spur arising from the inferior calcaneus.  IMPRESSION: Small lateral malleolar avulsion. There is an avulsion also arising from the dorsal distal talus. Ankle mortise appears intact.   Electronically Signed   By: Bretta Bang M.D.   On: 06/30/2014 19:04   Ct Cervical Spine Wo Contrast  06/30/2014   CLINICAL DATA:  Larey Seat.  Neck pain.  EXAM: CT CERVICAL SPINE WITHOUT CONTRAST  TECHNIQUE: Multidetector CT imaging of the cervical spine was performed without intravenous contrast. Multiplanar CT image reconstructions were also generated.  COMPARISON:  None.  FINDINGS: Advanced degenerative cervical spondylosis with multilevel disc disease and facet disease. The overall alignment is maintained. The spinal canal is fairly generous. There is a compression type  fracture of the T1 vertebral body. There is also a laminar fracture on the left at C7.  No other definite fractures are identified. The C1-2 articulations are maintained. The dens is intact.  IMPRESSION: T1 compression fracture and left C7 laminar fracture.  Advanced degenerative cervical spondylosis with multilevel disc disease and facet disease.   Electronically Signed   By: Loralie Champagne M.D.   On: 06/30/2014 21:44   Dg Pelvis Portable  07/02/2014   CLINICAL DATA:  Postop ORIF of left proximal femur.  EXAM: PORTABLE PELVIS 1-2 VIEWS  COMPARISON:  Intraoperative fluoroscopic views 07/02/2014 pelvis and left hip radiographs 06/30/2014  FINDINGS: There are postoperative changes of intramedullary nail placement for a left intertrochanteric femur fracture. Fracture fragments are in near-anatomic alignment. No hardware complication is identified. No new fractures seen. Expected locules of gas are seen in the soft tissues about the proximal left femur.  IMPRESSION: Status post ORIF for left proximal femur fracture. No unexpected or acute findings.   Electronically Signed   By: Britta Mccreedy M.D.   On: 07/02/2014 14:58   Dg Chest Port 1 View  07/04/2014   CLINICAL DATA:  Leukocytosis.  EXAM: PORTABLE CHEST - 1 VIEW  COMPARISON:  Chest x-ray 06/30/2014.  FINDINGS: Lung volumes are normal. Large left upper lobe mass again noted, better demonstrated on prior chest CT 05/24/2014. Mild diffuse interstitial prominence is again noted, but appears be chronic and is similar to prior examinations. Diffuse peribronchial cuffing, also similar to the prior examination. There is some patchy ill-defined opacities developing in the right mid to lower lung. No pleural effusions. No evidence of pulmonary edema. Mild cardiomegaly. The patient is rotated to the right on today's exam, resulting in distortion of the mediastinal contours and reduced diagnostic sensitivity and specificity for mediastinal pathology. Atherosclerosis in  the thoracic aorta.  IMPRESSION: 1. Overall, the appearance the chest is very similar to recent prior examinations, with exception of some multifocal ill-defined opacities developing in the right mid to lower lung, concerning for developing bronchopneumonia. Close attention on followup studies is recommended. 2. Left upper lobe mass again noted, concerning for neoplasm.   Electronically Signed   By: Trudie Reed M.D.   On: 07/04/2014 09:00   Dg Foot Complete Right  06/30/2014   CLINICAL DATA:  Status post fall.  Bruising  EXAM: RIGHT FOOT COMPLETE - 3+ VIEW  COMPARISON:  None.  FINDINGS: The bones are osteopenic. There is mild degenerative changes at the first MTP joint. There is no evidence of arthropathy or other focal bone abnormality. Small plantar heel spur noted. Soft tissues are unremarkable. There is mild diffuse soft tissue swelling noted.  IMPRESSION: 1. Soft tissue swelling. 2. Mild chronic degenerative change.   Electronically Signed   By: Signa Kell M.D.   On: 06/30/2014 19:05     Assessment/Plan  1. History of fall Has resulted in fractures of the left hip and lumbar spine. At risk for additional falls.  2. Fracture of C7 vertebra, closed, with routine healing, subsequent encounter Wearing soft neck brace. Moderate discomfort.  3. Intertrochanteric fracture of left femur, closed, with routine healing, subsequent  encounter Post surgery with intramedullary nail  4. Fracture of T1 vertebra, with routine healing, subsequent encounter Moderate discomfort  5. Acute blood loss anemia Follow-up CBC  6. HCAP (healthcare-associated pneumonia) Resolved  7. Anemia of chronic disease Follow-up CBC  8. Abnormal chest x-ray Right upper lobe mass. Follow-up with later films  9. Weak Enrolled in physical therapy and occupational therapy. Goal of strengthening, safety mobility, and improved self-care skills.

## 2014-07-13 LAB — BASIC METABOLIC PANEL
BUN: 38 mg/dL — AB (ref 4–21)
Creatinine: 1.1 mg/dL (ref 0.5–1.1)
Glucose: 98 mg/dL
Potassium: 5.5 mmol/L — AB (ref 3.4–5.3)
Sodium: 136 mmol/L — AB (ref 137–147)

## 2014-07-13 LAB — CBC AND DIFFERENTIAL
HCT: 32 % — AB (ref 36–46)
Hemoglobin: 10.6 g/dL — AB (ref 12.0–16.0)
PLATELETS: 402 10*3/uL — AB (ref 150–399)
WBC: 9.4 10^3/mL

## 2014-07-13 LAB — HEPATIC FUNCTION PANEL
ALT: 9 U/L (ref 7–35)
AST: 15 U/L (ref 13–35)
Alkaline Phosphatase: 58 U/L (ref 25–125)
Bilirubin, Total: 0.6 mg/dL

## 2014-07-14 ENCOUNTER — Other Ambulatory Visit: Payer: Self-pay | Admitting: Nurse Practitioner

## 2014-07-14 DIAGNOSIS — D72829 Elevated white blood cell count, unspecified: Secondary | ICD-10-CM

## 2014-07-14 DIAGNOSIS — E875 Hyperkalemia: Secondary | ICD-10-CM | POA: Insufficient documentation

## 2014-07-14 DIAGNOSIS — D638 Anemia in other chronic diseases classified elsewhere: Secondary | ICD-10-CM

## 2014-07-21 ENCOUNTER — Other Ambulatory Visit: Payer: Self-pay | Admitting: *Deleted

## 2014-07-21 MED ORDER — FENTANYL 12 MCG/HR TD PT72: 12.5000 ug | MEDICATED_PATCH | TRANSDERMAL | Status: DC

## 2014-07-21 NOTE — Telephone Encounter (Signed)
[facility] Fax Order 

## 2014-07-29 ENCOUNTER — Other Ambulatory Visit: Payer: Self-pay | Admitting: *Deleted

## 2014-07-29 MED ORDER — TRAMADOL HCL 50 MG PO TABS: ORAL_TABLET | ORAL | Status: AC

## 2014-08-16 ENCOUNTER — Encounter: Payer: Self-pay | Admitting: Nurse Practitioner

## 2014-08-16 ENCOUNTER — Non-Acute Institutional Stay (SKILLED_NURSING_FACILITY): Payer: Medicare PPO | Admitting: Nurse Practitioner

## 2014-08-16 DIAGNOSIS — B0229 Other postherpetic nervous system involvement: Secondary | ICD-10-CM

## 2014-08-16 DIAGNOSIS — R609 Edema, unspecified: Secondary | ICD-10-CM

## 2014-08-16 DIAGNOSIS — N182 Chronic kidney disease, stage 2 (mild): Secondary | ICD-10-CM

## 2014-08-16 DIAGNOSIS — D638 Anemia in other chronic diseases classified elsewhere: Secondary | ICD-10-CM

## 2014-08-16 DIAGNOSIS — R918 Other nonspecific abnormal finding of lung field: Secondary | ICD-10-CM

## 2014-08-16 DIAGNOSIS — K5900 Constipation, unspecified: Secondary | ICD-10-CM

## 2014-08-16 DIAGNOSIS — I272 Pulmonary hypertension, unspecified: Secondary | ICD-10-CM

## 2014-08-16 DIAGNOSIS — R634 Abnormal weight loss: Secondary | ICD-10-CM

## 2014-08-16 DIAGNOSIS — I270 Primary pulmonary hypertension: Secondary | ICD-10-CM

## 2014-08-16 DIAGNOSIS — I10 Essential (primary) hypertension: Secondary | ICD-10-CM

## 2014-08-16 DIAGNOSIS — S72002S Fracture of unspecified part of neck of left femur, sequela: Secondary | ICD-10-CM

## 2014-08-16 DIAGNOSIS — S12600S Unspecified displaced fracture of seventh cervical vertebra, sequela: Secondary | ICD-10-CM

## 2014-08-16 NOTE — Assessment & Plan Note (Signed)
controlled on a low-dose Coreg.

## 2014-08-16 NOTE — Assessment & Plan Note (Signed)
Continued on home pain regimen of fentanyl patch and gabapentin.

## 2014-08-16 NOTE — Assessment & Plan Note (Signed)
Weights: 06/2014-07/2014 #120, 116, 115. May be resultant of decreased LLE edema sine the left hip surgery. Will continue to monitor weight weekly. The patient stated she sleeps and eats well.

## 2014-08-16 NOTE — Assessment & Plan Note (Signed)
Baseline edema BLE, but LLE was worse since the left hip surgery. Denied pain and no noted heat, tenderness, or change in ROM. Continue to observe.   

## 2014-08-16 NOTE — Assessment & Plan Note (Signed)
Baseline. 07/13/14 Bun/creat 38/1.1

## 2014-08-16 NOTE — Assessment & Plan Note (Signed)
05/20/14 Hgb 11.9 07/13/14 Hgb 10.6 Continue Fe 325mg  qd

## 2014-08-16 NOTE — Assessment & Plan Note (Signed)
Patient had cardiac catheterization in 2012 with normal coronaries.patient had a 2-D echo in September of 2005 with EF of 55-65% with no wall motion abnormalities. Mild aortic valve thickness, mild to moderate mitral annular calcification consistent with mild mitral stenosis left atrium was mildly dilated. Repeat 2-D echo with EF of 60-65%, mild aortic valve stenosis, severe pulmonary hypertension, grade 2 diastolic dysfunction.

## 2014-08-16 NOTE — Progress Notes (Signed)
Patient ID: Kim Hodges, female   DOB: 11/28/1908, [age] y.o.   MRN: [id number]   Code Status: DNR  No Known Allergies  Chief Complaint  Patient presents with  . Medical Management of Chronic Issues  . Acute Visit    swelling BLE L>R    HPI: Patient is a [age] y.o. female [facility] today for evaluation of  BLE edema, s/p left hip fx surgical repair, and chronic medical conditions.     Hospitalized 06/30/2014-07/06/2014 for s/p fall and resultants of left hip fx, T1 compression fracture, C7 lamina fracture. She is s/p IM nail L intertrochanteric femur 07/02/14 Orthopedics: Dr Lequita HaltAluisio 009/06/2014 -Lovenox to ASA 325mg -ASA 81mg  for post op DVT/PE prophylaxis. T1 compression fracture of C7 lamina fracture-Aspen neck collar, pneumonia-complete [facility]   Problem List Items Addressed This Visit   Pulmonary hypertension     Patient had cardiac catheterization in 2012 with normal coronaries.patient had a 2-D echo in September of 2005 with EF of 55-65% with no wall motion abnormalities. Mild aortic valve thickness, mild to moderate mitral annular calcification consistent with mild mitral stenosis left atrium was mildly dilated. Repeat 2-D echo with EF of 60-65%, mild aortic valve stenosis, severe pulmonary hypertension, grade 2 diastolic dysfunction.     Post herpetic neuralgia (Chronic)     Continued on home pain regimen of fentanyl patch and gabapentin.      Lung mass; LUL (Chronic)     Patient with a prior history of a pulmonary mass suspicious for malignancy however family decided not to pursue any further workup at this time. Patient is not hypoxic. Patient is not tachypneic. Continue pulmonary toilet.       Loss of weight - Primary (Chronic)     Weights: 06/2014-07/2014 #120, 116, 115. May be resultant of decreased LLE edema sine the left hip surgery. Will continue to monitor weight weekly. The patient stated she sleeps and eats well.     Essential hypertension     controlled on a low-dose Coreg.      Edema     Baseline edema BLE, but LLE was worse since the left hip surgery. Denied pain and no noted heat, tenderness, or change in ROM. Continue to observe.     Constipation     Takes Colace 100mg  bid and Senokot II daily.      Closed left hip fracture     Healed.     Chronic kidney disease, stage II (mild) (Chronic)     Baseline. 07/13/14 Bun/creat 38/1.1     C7 cervical fracture     Continue cervical collar.     Anemia of chronic disease (Chronic)     05/20/14 Hgb 11.9 07/13/14 Hgb 10.6 Continue Fe 325mg  qd       Review of Systems: Review of Systems  Constitutional: Positive for weight loss. Negative for fever, chills, malaise/fatigue and diaphoresis.       06/2014-07/2014 # 120, 116, and 115.   HENT: Positive for hearing loss. Negative for congestion, ear discharge, ear pain, nosebleeds, sore throat and tinnitus.        Mild HOH  Eyes: Negative for blurred vision, double vision, pain, discharge and redness.       Glaucoma R+L  Respiratory: Positive for cough. Negative for hemoptysis, sputum production, shortness of breath, wheezing and stridor.        Chronic  Cardiovascular: Positive for leg swelling. Negative for chest pain, palpitations,  orthopnea, claudication and PND.       LLE L>R  Gastrointestinal: Negative for heartburn, nausea, vomiting, abdominal pain, diarrhea, constipation, blood in stool and melena.  Genitourinary: Positive for frequency. Negative for dysuria, urgency, hematuria and flank pain.       X1-x2 per night.   Musculoskeletal: Positive for falls, joint pain and neck pain. Negative for back pain and myalgias.       Ambulates with walker. C/o upper left scapular area pain for a 5-6 days following a fall in her bathroom a few days earlier. Constant pain in moderate severity. Woke up a few times last night due to her discomfort. Pain is aching in nature and radiates acrose her upper thoracic  spine to the  right upper scapula at the same level and to the left arm.  Neck and left hip pain are well controlled presently.   Skin: Negative for itching and rash.       Let hip surgical incision healed. Small keratotic lesions anterior LLE and calf witch is injured with serous fluid leakage. No erythema, warmth, odorous drainage, and tenderness.    Neurological: Negative for dizziness, tingling, tremors, sensory change, speech change, focal weakness, seizures, loss of consciousness, weakness and headaches.  Endo/Heme/Allergies: Negative for environmental allergies and polydipsia. Does not bruise/bleed easily.  Psychiatric/Behavioral: Positive for memory loss. Negative for depression, suicidal ideas, hallucinations and substance abuse. The patient is not nervous/anxious and does not have insomnia.      Past Medical History  Diagnosis Date  . Chronic kidney disease, stage II (mild) 12/25/2012  . Unspecified arthropathy, lower leg 09/11/2011  . Swelling of limb 09/10/2012  . Other and unspecified hyperlipidemia 07/26/2011  . Actinic keratosis 07/26/2011  . Abnormality of gait 07/26/2011  . Hyperglycemia 07/26/2011  . Tear film insufficiency, unspecified 12/29/2009  . Other malaise and fatigue 12/29/2009  . Unspecified hearing loss 06/30/2009  . Unspecified hypothyroidism 12/15/2008  . Unspecified essential hypertension 12/15/2008  . Unspecified glaucoma 12/15/2008    both eyes  . Weak 08/02/13  . Hyponatremia 08/02/13  . Herpes zoster 08/02/13  . Post herpetic neuralgia 08/02/13  . Cerebral atrophy 08/02/13  . Cerebrovascular disease 08/02/13  . Atherosclerosis 08/02/13  . Abnormal chest x-ray 08/02/13    08/02/13 nodule in left mid to upper lung  . Murmur, cardiac 08/02/13    aortic ejection  . Fracture lumbar vertebra-closed 08/02/13    L1  . Memory changes 07/03/13  . Lung mass 07/01/2014  . Intertrochanteric fracture of left femur 06/30/14  . Fracture of T1 vertebra 06/30/14  . Fracture  of C7 vertebra, closed 06/30/14  . History of fall 06/30/14  . GERD (gastroesophageal reflux disease) 06/03/2014  . Unspecified constipation 07/07/2014   Past Surgical History  Procedure Laterality Date  . Small intestine surgery  1957  . Cardiac catheterization  2004  . Gallbladder surgery  2006  . Cholecystectomy    . Femur im nail Left 07/02/2014    Procedure: INTRAMEDULLARY (IM) NAIL FEMORAL;  Surgeon: Loanne Drilling, MD;  Location: [facility];  Service: Orthopedics;  Laterality: Left;   Social History:   reports that she has never smoked. She has never used smokeless tobacco. She reports that she does not drink alcohol or use illicit drugs.  Family History  Problem Relation Age of Onset  . Cancer Sister   . Cancer Brother     pancreatic  . Cancer Son     lung  . Cancer Brother  pancreatic    Medications: Patient's Medications  New Prescriptions   No medications on file  Previous Medications   ACETAMINOPHEN (TYLENOL ARTHRITIS PAIN) 650 MG CR TABLET    Take 650 mg by mouth 3 (three) times daily as needed for pain. May also take one tablet every 6 hours as needed for pain   CALCIUM CARB-CHOLECALCIFEROL (CALCIUM 500 +D) 500-400 MG-UNIT TABS    Take 1 tablet by mouth daily.   CARVEDILOL (COREG) 3.125 MG TABLET    Take 1 tablet (3.125 mg total) by mouth 2 (two) times daily with a meal.   DOCUSATE SODIUM 100 MG CAPS    Take 100 mg by mouth 2 (two) times daily.   DORZOLAMIDE-TIMOLOL (COSOPT) 22.3-6.8 MG/ML OPHTHALMIC SOLUTION    Place 1 drop into both eyes 2 (two) times daily.    ENOXAPARIN (LOVENOX) 30 MG/0.3ML INJECTION    Inject 0.3 mLs (30 mg total) into the skin daily. Take injections for seven more days and then switch over to a 325 mg Aspirin daily for three more weeks.   FEEDING SUPPLEMENT, ENSURE COMPLETE, (ENSURE COMPLETE) LIQD    Take 237 mLs by mouth 2 (two) times daily between meals.   FENTANYL (DURAGESIC - DOSED MCG/HR) 12 MCG/HR    Place 1 patch (12.5 mcg total) onto the  skin every 3 (three) days. Remove old patch   FERROUS SULFATE 325 (65 FE) MG TABLET    Take 1 tablet (325 mg total) by mouth daily with breakfast.   GABAPENTIN (NEURONTIN) 100 MG CAPSULE    Take 100 mg by mouth 3 (three) times daily.    GUAIFENESIN (MUCINEX) 600 MG 12 HR TABLET    Take 1 tablet (600 mg total) by mouth 2 (two) times daily. Take for 6 days then stop.   HYDROCODONE-ACETAMINOPHEN (NORCO/VICODIN) 5-325 MG PER TABLET       LEVOTHYROXINE (SYNTHROID, LEVOTHROID) 75 MCG TABLET    Take 75 mcg by mouth daily before breakfast.   OMEPRAZOLE (PRILOSEC) 20 MG CAPSULE    Take 20 mg by mouth daily.   SENNA (SENOKOT) 8.6 MG TABS TABLET    Take 1 tablet (8.6 mg total) by mouth 2 (two) times daily.   SPECIALTY VITAMINS PRODUCTS (ICAPS LUTEIN-ZEAXANTHIN PO)    Take 1 tablet by mouth daily.   TRAMADOL (ULTRAM) 50 MG TABLET    Take one tablet by mouth twice daily for pain  Modified Medications   No medications on file  Discontinued Medications   No medications on file     Physical Exam: Physical Exam  Constitutional: She is oriented to person, place, and time. She appears well-developed and well-nourished. No distress.  HENT:  Head: Normocephalic and atraumatic.  Right Ear: External ear normal.  Left Ear: External ear normal.  Nose: Nose normal.  Mouth/Throat: Oropharynx is clear and moist. No oropharyngeal exudate.  Eyes: Conjunctivae and EOM are normal. Pupils are equal, round, and reactive to light. Right eye exhibits no discharge. Left eye exhibits no discharge. No scleral icterus.  Neck: Normal range of motion. Neck supple. No JVD present. No tracheal deviation present. No thyromegaly present.  Cardiovascular: Normal rate and regular rhythm.   Murmur heard. 3/6  Pulmonary/Chest: Effort normal. No stridor. No respiratory distress. She has no wheezes. She has no rales. She exhibits no tenderness.  Abdominal: Soft. Bowel sounds are normal. She exhibits no distension. There is no  tenderness. There is no rebound and no guarding.  Genitourinary: Guaiac negative stool.  Musculoskeletal: Normal range  of motion. She exhibits edema. She exhibits no tenderness.  Ambulates with walker. C/o upper left scapular area pain for a 5-6 days following a fall in her bathroom a few days earlier. Constant pain in moderate severity. Woke up a few times last night due to her discomfort. Pain is aching in nature and radiates acrose her upper thoracic spine to the  right upper scapula at the same level and to the left arm. Mild edema entire left leg has improved since the left hip surgery.  BLE edema L1+, R trace only.   Lymphadenopathy:    She has no cervical adenopathy.  Neurological: She is alert and oriented to person, place, and time. She displays normal reflexes. No cranial nerve deficit. She exhibits normal muscle tone. Coordination normal.  Skin: Skin is warm and dry. No rash noted. She is not diaphoretic. No erythema. No pallor.  Let hip surgical scar.  Small keratotic lesions anterior LLE and calf witch is injured with serous fluid leakage. No erythema, warmth, odorous drainage, and tenderness.     Psychiatric: She has a normal mood and affect. Her behavior is normal. Judgment and thought content normal.    Filed Vitals:   08/16/14 1317  BP: 132/60  Pulse: 66  Temp: 97.4 F (36.3 C)  TempSrc: Tympanic  Resp: 20      Labs reviewed: Basic Metabolic Panel:  Recent Labs  16/10/96 05/20/14  07/02/14 0433  07/04/14 0445 07/05/14 0445 07/06/14 0425 07/13/14  NA  --  135*  < >  --   < > 132* 135* 131* 136*  K  --  4.1  < >  --   < > 3.8 4.3 4.1 5.5*  CL  --   --   < >  --   < > 98 99 96  --   CO2  --   --   < >  --   < > 21 24 22   --   GLUCOSE  --   --   < >  --   < > 117* 121* 110*  --   BUN  --  25*  < >  --   < > 25* 35* 37* 38*  CREATININE  --  1.0  < >  --   < > 0.90 1.15* 1.01 1.1  CALCIUM  --   --   < >  --   < > 8.0* 8.0* 8.0*  --   TSH 2.55 3.22  --  1.990   --   --   --   --   --   < > = values in this interval not displayed. Liver Function Tests:  Recent Labs  05/20/14 07/01/14 0632 07/13/14  AST 18 17 15   ALT 8 7 9   ALKPHOS 67 63 58  BILITOT  --  0.5  --   PROT  --  6.5  --   ALBUMIN  --  3.2*  --    CBC:  Recent Labs  06/30/14 1807 07/01/14 0632  07/04/14 2130 07/05/14 0445 07/06/14 0425 07/13/14  WBC 8.4 9.7  < > 12.0* 12.7* 11.6* 9.4  NEUTROABS 5.8 6.9  --   --   --   --   --   HGB 10.6* 9.8*  < > 11.0* 10.5* 10.6* 10.6*  HCT 32.7* 29.3*  < > 33.3* 30.7* 31.3* 32*  MCV 91.1 89.6  < > 89.3 88.5 88.4  --   PLT 241 203  < > 189  205 250 402*  < > = values in this interval not displayed.  Past Procedures:  09/29/13 Echocardiogram: moderate AI, moderate to severe MR, moderate TR, moderate pulmonary hypertension, mild LLH, hyperdynamic LV systolic function, EF 75%.    05/24/14 CT chest w/o contrast:  IMPRESSION:  Enlarging masslike density in the area of prior linear scarring on  2009 chest CT. Findings are concerning for possible scar carcinoma.  This could be further evaluated with PET CT or tissue biopsy if felt  clinically indicated.  Trace left pleural effusion.  Mild cardiomegaly.  Small hiatal hernia.  Borderline low right paratracheal lymph node.  06/2014 hospital:   CT C-spine 06/30/2014  X-ray of the left hip 06/30/2014  X-ray of the right foot 06/30/2014  Chest x-ray 06/30/2001, 07/04/2014  X-ray of the right ankle 06/30/2014  2 d echo 07/01/14   Assessment/Plan Loss of weight Weights: 06/2014-07/2014 #120, 116, 115. May be resultant of decreased LLE edema sine the left hip surgery. Will continue to monitor weight weekly. The patient stated she sleeps and eats well.   Edema Baseline edema BLE, but LLE was worse since the left hip surgery. Denied pain and no noted heat, tenderness, or change in ROM. Continue to observe.   Essential hypertension controlled on a low-dose Coreg.    Closed left hip  fracture Healed.   Chronic kidney disease, stage II (mild) Baseline. 07/13/14 Bun/creat 38/1.1   C7 cervical fracture Continue cervical collar.   Anemia of chronic disease 05/20/14 Hgb 11.9 07/13/14 Hgb 10.6 Continue Fe 325mg  qd  Constipation Takes Colace 100mg  bid and Senokot II daily.    Pulmonary hypertension Patient had cardiac catheterization in 2012 with normal coronaries.patient had a 2-D echo in September of 2005 with EF of 55-65% with no wall motion abnormalities. Mild aortic valve thickness, mild to moderate mitral annular calcification consistent with mild mitral stenosis left atrium was mildly dilated. Repeat 2-D echo with EF of 60-65%, mild aortic valve stenosis, severe pulmonary hypertension, grade 2 diastolic dysfunction.   Post herpetic neuralgia Continued on home pain regimen of fentanyl patch and gabapentin.    Lung mass; LUL Patient with a prior history of a pulmonary mass suspicious for malignancy however family decided not to pursue any further workup at this time. Patient is not hypoxic. Patient is not tachypneic. Continue pulmonary toilet.       Family/ Staff Communication: observe the patient.   Goals of Care: SNF  Labs/tests ordered: none

## 2014-08-16 NOTE — Assessment & Plan Note (Signed)
Takes Colace 100mg  bid and Senokot II daily.

## 2014-08-16 NOTE — Assessment & Plan Note (Signed)
Healed

## 2014-08-16 NOTE — Assessment & Plan Note (Signed)
Patient with a prior history of a pulmonary mass suspicious for malignancy however family decided not to pursue any further workup at this time. Patient is not hypoxic. Patient is not tachypneic. Continue pulmonary toilet.

## 2014-08-16 NOTE — Assessment & Plan Note (Signed)
Continue cervical collar.

## 2014-09-01 ENCOUNTER — Non-Acute Institutional Stay (SKILLED_NURSING_FACILITY): Payer: Medicare PPO | Admitting: Nurse Practitioner

## 2014-09-01 ENCOUNTER — Encounter: Payer: Self-pay | Admitting: Nurse Practitioner

## 2014-09-01 DIAGNOSIS — K5900 Constipation, unspecified: Secondary | ICD-10-CM

## 2014-09-01 DIAGNOSIS — K219 Gastro-esophageal reflux disease without esophagitis: Secondary | ICD-10-CM

## 2014-09-01 DIAGNOSIS — R911 Solitary pulmonary nodule: Secondary | ICD-10-CM

## 2014-09-01 DIAGNOSIS — E875 Hyperkalemia: Secondary | ICD-10-CM

## 2014-09-01 DIAGNOSIS — I10 Essential (primary) hypertension: Secondary | ICD-10-CM

## 2014-09-01 DIAGNOSIS — R531 Weakness: Secondary | ICD-10-CM

## 2014-09-01 DIAGNOSIS — I272 Pulmonary hypertension, unspecified: Secondary | ICD-10-CM

## 2014-09-01 DIAGNOSIS — E039 Hypothyroidism, unspecified: Secondary | ICD-10-CM

## 2014-09-01 DIAGNOSIS — S32010S Wedge compression fracture of first lumbar vertebra, sequela: Secondary | ICD-10-CM

## 2014-09-01 DIAGNOSIS — R609 Edema, unspecified: Secondary | ICD-10-CM

## 2014-09-01 DIAGNOSIS — S72142S Displaced intertrochanteric fracture of left femur, sequela: Secondary | ICD-10-CM

## 2014-09-01 DIAGNOSIS — D638 Anemia in other chronic diseases classified elsewhere: Secondary | ICD-10-CM

## 2014-09-01 DIAGNOSIS — I270 Primary pulmonary hypertension: Secondary | ICD-10-CM

## 2014-09-01 DIAGNOSIS — R634 Abnormal weight loss: Secondary | ICD-10-CM

## 2014-09-01 DIAGNOSIS — B0229 Other postherpetic nervous system involvement: Secondary | ICD-10-CM

## 2014-09-01 DIAGNOSIS — S22019S Unspecified fracture of first thoracic vertebra, sequela: Secondary | ICD-10-CM

## 2014-09-01 DIAGNOSIS — N182 Chronic kidney disease, stage 2 (mild): Secondary | ICD-10-CM

## 2014-09-01 NOTE — Assessment & Plan Note (Signed)
05/20/14 urine culture No growth.  08/18/14 skin tear LLE  08/19/14 fell  08/21/14 negative urine culture.

## 2014-09-01 NOTE — Assessment & Plan Note (Signed)
08/25/14 dc'd Senokot continued Colace.   

## 2014-09-01 NOTE — Assessment & Plan Note (Signed)
07/13/14 Bun/creat 38/1.1

## 2014-09-01 NOTE — Assessment & Plan Note (Signed)
Continue cervical collar.  

## 2014-09-01 NOTE — Assessment & Plan Note (Signed)
Continued on home pain regimen of fentanyl patch and gabapentin.   

## 2014-09-01 NOTE — Assessment & Plan Note (Signed)
Healed. Walker for short distance and w/c to go further.

## 2014-09-01 NOTE — Assessment & Plan Note (Signed)
05/20/14 Hgb 11.9 07/13/14 Hgb 10.6

## 2014-09-01 NOTE — Assessment & Plan Note (Signed)
controlled on a low-dose Coreg.   

## 2014-09-01 NOTE — Assessment & Plan Note (Signed)
Stable, continue Omeprazole 20mg daily.  

## 2014-09-01 NOTE — Progress Notes (Signed)
Patient ID: Kim FossaMary P Korber, female   DOB: 04/18/1909, [age] y.o.   MRN: [id number]   Code Status: DNR  No Known Allergies  Chief Complaint  Patient presents with  . Medical Management of Chronic Issues    HPI: Patient is a 80 y.o. female seen in the [facility] today for evaluation of chronic medical conditions.     Hospitalized 06/30/2014-07/06/2014 for s/p fall and resultants of left hip fx, T1 compression fracture, C7 lamina fracture. She is s/p IM nail L intertrochanteric femur 07/02/14 Orthopedics: Dr Lequita HaltAluisio 009/06/2014 -Lovenox to ASA 325mg -ASA 81mg  for post op DVT/PE prophylaxis. T1 compression fracture of C7 lamina fracture-Aspen neck collar, pneumonia-complete 6 [facility]   Problem List Items Addressed This Visit    Weak - Primary (Chronic)    05/20/14 urine culture No growth.  08/18/14 skin tear LLE  08/19/14 fell  08/21/14 negative urine culture.      Pulmonary hypertension    Patient had cardiac catheterization in 2012 with normal coronaries.patient had a 2-D echo in September of 2005 with EF of 55-65% with no wall motion abnormalities. Mild aortic valve thickness, mild to moderate mitral annular calcification consistent with mild mitral stenosis left atrium was mildly dilated. Repeat 2-D echo with EF of 60-65%, mild aortic valve stenosis, severe pulmonary hypertension, grade 2 diastolic dysfunction.      Post herpetic neuralgia (Chronic)    Continued on home pain regimen of fentanyl patch and gabapentin.       Nodule of left lung (Chronic)    Patient with a prior history of a pulmonary mass suspicious for malignancy however family decided not to pursue any further workup at this time. Patient is not hypoxic. Patient is not tachypneic. Continue pulmonary toilet. 04/2914 CXR let mid upper lung mass density-not new. C/o lef scapular area pain. Mild cardiomegaly w/o evident CHF.  05/24/14 CT chest w/o contrast: enlarging masslike density in the  area of prior linear scarring on 2009 chest CT. Findings are concerning for possible scar carcinoma. This could be futher evaluated PET CT or tissue biopsy if felt clinically indicated. Trace left pleural effusion. Mild cardiomegaly. Small hiatal hernia. Borderline low right paratracheal lymph node.         Loss of weight (Chronic)    Weights: 06/2014-07/2014 #120, 116, 115. May be resultant of decreased LLE edema sine the left hip surgery. Also lung malignancy can be a contributory.  Will continue to monitor weight weekly. The patient stated she sleeps and eats well.      Intertrochanteric fracture of left femur    Healed. Walker for short distance and w/c to go further.     Hypothyroidism (Chronic)    Takes Levothyroxine 75mcg daily. 03/23/14 TSH 2.553. 05/20/14 TSH 3.224      Hyperkalemia    07/13/14 K 5.5     GERD (gastroesophageal reflux disease)    Stable, continue Omeprazole 20mg  daily.       Fracture of T1 vertebra    Continue cervical collar.      Essential hypertension    controlled on a low-dose Coreg.       Edema    Baseline edema BLE, but LLE was worse since the left hip surgery. Denied pain and no noted heat, tenderness, or change in ROM. Continue to observe.      Constipation    08/25/14 dc'd Senokot continued Colace.      Compression fracture of L1 lumbar vertebra  Identified on X-ray Lumbar 08/02/13--remote inferior endplate compression fx--observe     Chronic kidney disease, stage II (mild) (Chronic)    07/13/14 Bun/creat 38/1.1     Anemia of chronic disease (Chronic)    05/20/14 Hgb 11.9 07/13/14 Hgb 10.6        Review of Systems: Review of Systems  Constitutional: Positive for weight loss. Negative for fever, chills, malaise/fatigue and diaphoresis.       06/2014-07/2014 # 120, 116, and 115.   HENT: Positive for hearing loss. Negative for congestion, ear discharge, ear pain, nosebleeds, sore throat and tinnitus.        Mild HOH  Eyes:  Negative for blurred vision, double vision, pain, discharge and redness.       Glaucoma R+L  Respiratory: Positive for cough. Negative for hemoptysis, sputum production, shortness of breath, wheezing and stridor.        Chronic  Cardiovascular: Positive for leg swelling. Negative for chest pain, palpitations, orthopnea, claudication and PND.       LLE L>R  Gastrointestinal: Negative for heartburn, nausea, vomiting, abdominal pain, diarrhea, constipation, blood in stool and melena.  Genitourinary: Positive for frequency. Negative for dysuria, urgency, hematuria and flank pain.       X1-x2 per night.   Musculoskeletal: Positive for joint pain, falls and neck pain. Negative for myalgias and back pain.       Ambulates with walker. C/o upper left scapular area pain for a 5-6 days following a fall in her bathroom a few days earlier. Constant pain in moderate severity. Woke up a few times last night due to her discomfort. Pain is aching in nature and radiates acrose her upper thoracic spine to the  right upper scapula at the same level and to the left arm.  Neck and left hip pain are well controlled presently.   Skin: Negative for itching and rash.       Let hip surgical incision healed. Small keratotic lesions anterior LLE and calf witch is injured with serous fluid leakage. No erythema, warmth, odorous drainage, and tenderness.    Neurological: Negative for dizziness, tingling, tremors, sensory change, speech change, focal weakness, seizures, loss of consciousness, weakness and headaches.  Endo/Heme/Allergies: Negative for environmental allergies and polydipsia. Does not bruise/bleed easily.  Psychiatric/Behavioral: Positive for memory loss. Negative for depression, suicidal ideas, hallucinations and substance abuse. The patient is not nervous/anxious and does not have insomnia.      Past Medical History  Diagnosis Date  . Chronic kidney disease, stage II (mild) 12/25/2012  . Unspecified arthropathy,  lower leg 09/11/2011  . Swelling of limb 09/10/2012  . Other and unspecified hyperlipidemia 07/26/2011  . Actinic keratosis 07/26/2011  . Abnormality of gait 07/26/2011  . Hyperglycemia 07/26/2011  . Tear film insufficiency, unspecified 12/29/2009  . Other malaise and fatigue 12/29/2009  . Unspecified hearing loss 06/30/2009  . Unspecified hypothyroidism 12/15/2008  . Unspecified essential hypertension 12/15/2008  . Unspecified glaucoma 12/15/2008    both eyes  . Weak 08/02/13  . Hyponatremia 08/02/13  . Herpes zoster 08/02/13  . Post herpetic neuralgia 08/02/13  . Cerebral atrophy 08/02/13  . Cerebrovascular disease 08/02/13  . Atherosclerosis 08/02/13  . Abnormal chest x-ray 08/02/13    08/02/13 nodule in left mid to upper lung  . Murmur, cardiac 08/02/13    aortic ejection  . Fracture lumbar vertebra-closed 08/02/13    L1  . Memory changes 07/03/13  . Lung mass 07/01/2014  . Intertrochanteric fracture of left femur 06/30/14  .  Fracture of T1 vertebra 06/30/14  . Fracture of C7 vertebra, closed 06/30/14  . History of fall 06/30/14  . GERD (gastroesophageal reflux disease) 06/03/2014  . Unspecified constipation 07/07/2014   Past Surgical History  Procedure Laterality Date  . Small intestine surgery  1957  . Cardiac catheterization  2004  . Gallbladder surgery  2006  . Cholecystectomy    . Femur im nail Left 07/02/2014    Procedure: INTRAMEDULLARY (IM) NAIL FEMORAL;  Surgeon: Loanne Drilling, MD;  Location: [facility];  Service: Orthopedics;  Laterality: Left;   Social History:   reports that she has never smoked. She has never used smokeless tobacco. She reports that she does not drink alcohol or use illicit drugs.  Family History  Problem Relation Age of Onset  . Cancer Sister   . Cancer Brother     pancreatic  . Cancer Son     lung  . Cancer Brother     pancreatic    Medications: Patient's Medications  New Prescriptions   No medications on file  Previous Medications    ACETAMINOPHEN (TYLENOL ARTHRITIS PAIN) 650 MG CR TABLET    Take 650 mg by mouth 3 (three) times daily as needed for pain. May also take one tablet every 6 hours as needed for pain   CALCIUM CARB-CHOLECALCIFEROL (CALCIUM 500 +D) 500-400 MG-UNIT TABS    Take 1 tablet by mouth daily.   CARVEDILOL (COREG) 3.125 MG TABLET    Take 1 tablet (3.125 mg total) by mouth 2 (two) times daily with a meal.   DOCUSATE SODIUM 100 MG CAPS    Take 100 mg by mouth 2 (two) times daily.   DORZOLAMIDE-TIMOLOL (COSOPT) 22.3-6.8 MG/ML OPHTHALMIC SOLUTION    Place 1 drop into both eyes 2 (two) times daily.    ENOXAPARIN (LOVENOX) 30 MG/0.3ML INJECTION    Inject 0.3 mLs (30 mg total) into the skin daily. Take injections for seven more days and then switch over to a 325 mg Aspirin daily for three more weeks.   FEEDING SUPPLEMENT, ENSURE COMPLETE, (ENSURE COMPLETE) LIQD    Take 237 mLs by mouth 2 (two) times daily between meals.   FENTANYL (DURAGESIC - DOSED MCG/HR) 12 MCG/HR    Place 1 patch (12.5 mcg total) onto the skin every 3 (three) days. Remove old patch   FERROUS SULFATE 325 (65 FE) MG TABLET    Take 1 tablet (325 mg total) by mouth daily with breakfast.   GABAPENTIN (NEURONTIN) 100 MG CAPSULE    Take 100 mg by mouth 3 (three) times daily.    GUAIFENESIN (MUCINEX) 600 MG 12 HR TABLET    Take 1 tablet (600 mg total) by mouth 2 (two) times daily. Take for 6 days then stop.   HYDROCODONE-ACETAMINOPHEN (NORCO/VICODIN) 5-325 MG PER TABLET       LEVOTHYROXINE (SYNTHROID, LEVOTHROID) 75 MCG TABLET    Take 75 mcg by mouth daily before breakfast.   OMEPRAZOLE (PRILOSEC) 20 MG CAPSULE    Take 20 mg by mouth daily.   SENNA (SENOKOT) 8.6 MG TABS TABLET    Take 1 tablet (8.6 mg total) by mouth 2 (two) times daily.   SPECIALTY VITAMINS PRODUCTS (ICAPS LUTEIN-ZEAXANTHIN PO)    Take 1 tablet by mouth daily.   TRAMADOL (ULTRAM) 50 MG TABLET    Take one tablet by mouth twice daily for pain  Modified Medications   No medications on file    Discontinued Medications   No medications on file  Physical Exam: Physical Exam  Constitutional: She is oriented to person, place, and time. She appears well-developed and well-nourished. No distress.  HENT:  Head: Normocephalic and atraumatic.  Right Ear: External ear normal.  Left Ear: External ear normal.  Nose: Nose normal.  Mouth/Throat: Oropharynx is clear and moist. No oropharyngeal exudate.  Eyes: Conjunctivae and EOM are normal. Pupils are equal, round, and reactive to light. Right eye exhibits no discharge. Left eye exhibits no discharge. No scleral icterus.  Neck: Normal range of motion. Neck supple. No JVD present. No tracheal deviation present. No thyromegaly present.  Cardiovascular: Normal rate and regular rhythm.   Murmur heard. 3/6  Pulmonary/Chest: Effort normal. No stridor. No respiratory distress. She has no wheezes. She has no rales. She exhibits no tenderness.  Abdominal: Soft. Bowel sounds are normal. She exhibits no distension. There is no tenderness. There is no rebound and no guarding.  Genitourinary: Guaiac negative stool.  Musculoskeletal: Normal range of motion. She exhibits edema. She exhibits no tenderness.  Ambulates with walker. C/o upper left scapular area pain for a 5-6 days following a fall in her bathroom a few days earlier. Constant pain in moderate severity. Woke up a few times last night due to her discomfort. Pain is aching in nature and radiates acrose her upper thoracic spine to the  right upper scapula at the same level and to the left arm. Mild edema entire left leg has improved since the left hip surgery.  BLE edema L1+, R trace only.   Lymphadenopathy:    She has no cervical adenopathy.  Neurological: She is alert and oriented to person, place, and time. She displays normal reflexes. No cranial nerve deficit. She exhibits normal muscle tone. Coordination normal.  Skin: Skin is warm and dry. No rash noted. She is not diaphoretic. No  erythema. No pallor.  Let hip surgical scar.  Small keratotic lesions anterior LLE and calf witch is injured with serous fluid leakage. No erythema, warmth, odorous drainage, and tenderness.     Psychiatric: She has a normal mood and affect. Her behavior is normal. Judgment and thought content normal.    Filed Vitals:   09/01/14 1542  BP: 122/80  Pulse: 90  Temp: 97.4 F (36.3 C)  TempSrc: Tympanic  Resp: 18      Labs reviewed: Basic Metabolic Panel:  Recent Labs  40/98/11 05/20/14  07/02/14 0433  07/04/14 0445 07/05/14 0445 07/06/14 0425 07/13/14  NA  --  135*  < >  --   < > 132* 135* 131* 136*  K  --  4.1  < >  --   < > 3.8 4.3 4.1 5.5*  CL  --   --   < >  --   < > 98 99 96  --   CO2  --   --   < >  --   < > 21 24 22   --   GLUCOSE  --   --   < >  --   < > 117* 121* 110*  --   BUN  --  25*  < >  --   < > 25* 35* 37* 38*  CREATININE  --  1.0  < >  --   < > 0.90 1.15* 1.01 1.1  CALCIUM  --   --   < >  --   < > 8.0* 8.0* 8.0*  --   TSH 2.55 3.22  --  1.990  --   --   --   --   --   < > =  values in this interval not displayed. Liver Function Tests:  Recent Labs  05/20/14 07/01/14 0632 07/13/14  AST 18 17 15   ALT 8 7 9   ALKPHOS 67 63 58  BILITOT  --  0.5  --   PROT  --  6.5  --   ALBUMIN  --  3.2*  --    CBC:  Recent Labs  06/30/14 1807 07/01/14 0632  07/04/14 2130 07/05/14 0445 07/06/14 0425 07/13/14  WBC 8.4 9.7  < > 12.0* 12.7* 11.6* 9.4  NEUTROABS 5.8 6.9  --   --   --   --   --   HGB 10.6* 9.8*  < > 11.0* 10.5* 10.6* 10.6*  HCT 32.7* 29.3*  < > 33.3* 30.7* 31.3* 32*  MCV 91.1 89.6  < > 89.3 88.5 88.4  --   PLT 241 203  < > 189 205 250 402*  < > = values in this interval not displayed.  Past Procedures:  09/29/13 Echocardiogram: moderate AI, moderate to severe MR, moderate TR, moderate pulmonary hypertension, mild LLH, hyperdynamic LV systolic function, EF 75%.    05/24/14 CT chest w/o contrast:  IMPRESSION:  Enlarging masslike density in the  area of prior linear scarring on  2009 chest CT. Findings are concerning for possible scar carcinoma.  This could be further evaluated with PET CT or tissue biopsy if felt  clinically indicated.  Trace left pleural effusion.  Mild cardiomegaly.  Small hiatal hernia.  Borderline low right paratracheal lymph node.  06/2014 hospital:   CT C-spine 06/30/2014  X-ray of the left hip 06/30/2014  X-ray of the right foot 06/30/2014  Chest x-ray 06/30/2001, 07/04/2014  X-ray of the right ankle 06/30/2014  2 d echo 07/01/14   Assessment/Plan Weak 05/20/14 urine culture No growth.  08/18/14 skin tear LLE  08/19/14 fell  08/21/14 negative urine culture.    Pulmonary hypertension Patient had cardiac catheterization in 2012 with normal coronaries.patient had a 2-D echo in September of 2005 with EF of 55-65% with no wall motion abnormalities. Mild aortic valve thickness, mild to moderate mitral annular calcification consistent with mild mitral stenosis left atrium was mildly dilated. Repeat 2-D echo with EF of 60-65%, mild aortic valve stenosis, severe pulmonary hypertension, grade 2 diastolic dysfunction.    Post herpetic neuralgia Continued on home pain regimen of fentanyl patch and gabapentin.     Nodule of left lung Patient with a prior history of a pulmonary mass suspicious for malignancy however family decided not to pursue any further workup at this time. Patient is not hypoxic. Patient is not tachypneic. Continue pulmonary toilet. 04/2914 CXR let mid upper lung mass density-not new. C/o lef scapular area pain. Mild cardiomegaly w/o evident CHF.  05/24/14 CT chest w/o contrast: enlarging masslike density in the area of prior linear scarring on 2009 chest CT. Findings are concerning for possible scar carcinoma. This could be futher evaluated PET CT or tissue biopsy if felt clinically indicated. Trace left pleural effusion. Mild cardiomegaly. Small hiatal hernia. Borderline low right  paratracheal lymph node.       Loss of weight Weights: 06/2014-07/2014 #120, 116, 115. May be resultant of decreased LLE edema sine the left hip surgery. Also lung malignancy can be a contributory.  Will continue to monitor weight weekly. The patient stated she sleeps and eats well.    Intertrochanteric fracture of left femur Healed. Walker for short distance and w/c to go further.   Hypothyroidism Takes Levothyroxine daily. 03/23/14 TSH 2.553.  05/20/14 TSH 3.224    Hyperkalemia 07/13/14 K 5.5   GERD (gastroesophageal reflux disease) Stable, continue Omeprazole 20mg  daily.     Fracture of T1 vertebra Continue cervical collar.    Essential hypertension controlled on a low-dose Coreg.     Edema Baseline edema BLE, but LLE was worse since the left hip surgery. Denied pain and no noted heat, tenderness, or change in ROM. Continue to observe.    Constipation 08/25/14 dc'd Senokot continued Colace.    Compression fracture of L1 lumbar vertebra Identified on X-ray Lumbar 08/02/13--remote inferior endplate compression fx--observe   Chronic kidney disease, stage II (mild) 07/13/14 Bun/creat 38/1.1   Anemia of chronic disease 05/20/14 Hgb 11.9 07/13/14 Hgb 10.6     Family/ Staff Communication: observe the patient.   Goals of Care: SNF  Labs/tests ordered: none

## 2014-09-01 NOTE — Assessment & Plan Note (Signed)
Takes Levothyroxine 75mcg daily. 03/23/14 TSH 2.553. 05/20/14 TSH 3.224

## 2014-09-01 NOTE — Assessment & Plan Note (Signed)
Baseline edema BLE, but LLE was worse since the left hip surgery. Denied pain and no noted heat, tenderness, or change in ROM. Continue to observe.

## 2014-09-01 NOTE — Assessment & Plan Note (Signed)
Patient had cardiac catheterization in 2012 with normal coronaries.patient had a 2-D echo in September of 2005 with EF of 55-65% with no wall motion abnormalities. Mild aortic valve thickness, mild to moderate mitral annular calcification consistent with mild mitral stenosis left atrium was mildly dilated. Repeat 2-D echo with EF of 60-65%, mild aortic valve stenosis, severe pulmonary hypertension, grade 2 diastolic dysfunction.  

## 2014-09-01 NOTE — Assessment & Plan Note (Signed)
Identified on X-ray Lumbar 08/02/13--remote inferior endplate compression fx--observe

## 2014-09-01 NOTE — Assessment & Plan Note (Signed)
Patient with a prior history of a pulmonary mass suspicious for malignancy however family decided not to pursue any further workup at this time. Patient is not hypoxic. Patient is not tachypneic. Continue pulmonary toilet. 04/2914 CXR let mid upper lung mass density-not new. C/o lef scapular area pain. Mild cardiomegaly w/o evident CHF.  05/24/14 CT chest w/o contrast: enlarging masslike density in the area of prior linear scarring on 2009 chest CT. Findings are concerning for possible scar carcinoma. This could be futher evaluated PET CT or tissue biopsy if felt clinically indicated. Trace left pleural effusion. Mild cardiomegaly. Small hiatal hernia. Borderline low right paratracheal lymph node.

## 2014-09-01 NOTE — Assessment & Plan Note (Signed)
07/13/14 K 5.5

## 2014-09-01 NOTE — Assessment & Plan Note (Signed)
Weights: 06/2014-07/2014 #120, 116, 115. May be resultant of decreased LLE edema sine the left hip surgery. Also lung malignancy can be a contributory.  Will continue to monitor weight weekly. The patient stated she sleeps and eats well.

## 2014-09-02 ENCOUNTER — Encounter: Payer: Self-pay | Admitting: Internal Medicine

## 2014-09-20 ENCOUNTER — Encounter: Payer: Self-pay | Admitting: Internal Medicine

## 2014-09-23 ENCOUNTER — Encounter: Payer: Self-pay | Admitting: Internal Medicine

## 2014-09-23 ENCOUNTER — Non-Acute Institutional Stay (SKILLED_NURSING_FACILITY): Payer: Medicare PPO | Admitting: Internal Medicine

## 2014-09-23 DIAGNOSIS — D638 Anemia in other chronic diseases classified elsewhere: Secondary | ICD-10-CM

## 2014-09-23 DIAGNOSIS — I10 Essential (primary) hypertension: Secondary | ICD-10-CM

## 2014-09-23 DIAGNOSIS — R748 Abnormal levels of other serum enzymes: Secondary | ICD-10-CM

## 2014-09-23 DIAGNOSIS — N182 Chronic kidney disease, stage 2 (mild): Secondary | ICD-10-CM

## 2014-09-23 DIAGNOSIS — R197 Diarrhea, unspecified: Secondary | ICD-10-CM

## 2014-09-23 NOTE — Progress Notes (Signed)
Patient ID: Kim FossaMary P Hodges, female   DOB: 06/14/1909, [age] y.o.   MRN: [id number]    [facility]  Place of Service: SNF (31)    No Known Allergies  Chief Complaint  Patient presents with  . Diarrhea    HPI:  Acute visit. Nursing reported loose stools that were Zenaida Tesar 4 this morning. She has not been running a fever. She denies nausea. Her abdomen is nontender. She has been able to eat, but says her appetite really hasn't been very good. She has not been on antibiotics recently.  In reviewing the chart, there are some unresolved issues. CBC has not been followed up on recently and she remains on ferrous sulfate. She had a previous episode of loose stools and this caused the Senokot to be discontinued. She remains on Colace. There is a previous history of elevated alkaline phosphatase needs further follow-up.  Medications: Patient's Medications  New Prescriptions   No medications on file  Previous Medications   ACETAMINOPHEN (TYLENOL ARTHRITIS PAIN) 650 MG CR TABLET    Take 650 mg by mouth 3 (three) times daily as needed for pain. May also take one tablet every 6 hours as needed for pain   CALCIUM CARB-CHOLECALCIFEROL (CALCIUM 500 +D) 500-400 MG-UNIT TABS    Take 1 tablet by mouth daily.   CARVEDILOL (COREG) 3.125 MG TABLET    Take 1 tablet (3.125 mg total) by mouth 2 (two) times daily with a meal.   DOCUSATE SODIUM 100 MG CAPS    Take 100 mg by mouth 2 (two) times daily.   DORZOLAMIDE-TIMOLOL (COSOPT) 22.3-6.8 MG/ML OPHTHALMIC SOLUTION    Place 1 drop into both eyes 2 (two) times daily.    ENOXAPARIN (LOVENOX) 30 MG/0.3ML INJECTION    Inject 0.3 mLs (30 mg total) into the skin daily. Take injections for seven more days and then switch over to a 325 mg Aspirin daily for three more weeks.   FEEDING SUPPLEMENT, ENSURE COMPLETE, (ENSURE COMPLETE) LIQD    Take 237 mLs by mouth 2 (two) times daily between meals.   FENTANYL (DURAGESIC - DOSED  MCG/HR) 12 MCG/HR    Place 1 patch (12.5 mcg total) onto the skin every 3 (three) days. Remove old patch   FERROUS SULFATE 325 (65 FE) MG TABLET    Take 1 tablet (325 mg total) by mouth daily with breakfast.   GUAIFENESIN (MUCINEX) 600 MG 12 HR TABLET    Take 1 tablet (600 mg total) by mouth 2 (two) times daily. Take for 6 days then stop.   HYDROCODONE-ACETAMINOPHEN (NORCO/VICODIN) 5-325 MG PER TABLET       LEVOTHYROXINE (SYNTHROID, LEVOTHROID) 75 MCG TABLET    Take 75 mcg by mouth daily before breakfast.   OMEPRAZOLE (PRILOSEC) 20 MG CAPSULE    Take 20 mg by mouth daily.   SENNA (SENOKOT) 8.6 MG TABS TABLET    Take 1 tablet (8.6 mg total) by mouth 2 (two) times daily.   SPECIALTY VITAMINS PRODUCTS (ICAPS LUTEIN-ZEAXANTHIN PO)    Take 1 tablet by mouth daily.   TRAMADOL (ULTRAM) 50 MG TABLET    Take one tablet by mouth twice daily for pain  Modified Medications   No medications on file  Discontinued Medications   GABAPENTIN (NEURONTIN) 100 MG CAPSULE    Take 100 mg by mouth 3 (three) times daily.      Review of Systems  Constitutional: Negative for fever, activity change, appetite change, fatigue and unexpected  weight change.  HENT: Positive for hearing loss. Negative for ear pain.   Eyes: Negative for visual disturbance.  Respiratory: Negative for cough, choking, shortness of breath and wheezing.   Cardiovascular: Positive for leg swelling. Negative for chest pain and palpitations.  Gastrointestinal: Negative.   Endocrine: Negative.   Musculoskeletal:       Generalized weakness. No falls. Uses walker.  Allergic/Immunologic: Negative.   Neurological: Positive for weakness.  Hematological: Negative.   Psychiatric/Behavioral: Negative.     Filed Vitals:   09/23/14 1341  BP: 146/62  Pulse: 66  Temp: 98.6 F (37 C)  Resp: 20  Height: 5\' 1"  (1.549 m)  Weight: 115 lb 4.8 oz (52.3 kg)   Body mass index is 21.8 kg/(m^2).  Physical Exam  Constitutional: She is oriented to person,  place, and time. She appears well-developed and well-nourished. No distress.  HENT:  Head: Normocephalic and atraumatic.  Right Ear: External ear normal.  Left Ear: External ear normal.  Nose: Nose normal.  Mouth/Throat: Oropharynx is clear and moist. No oropharyngeal exudate.  Eyes: Conjunctivae and EOM are normal. Pupils are equal, round, and reactive to light. Right eye exhibits no discharge. Left eye exhibits no discharge. No scleral icterus.  Neck: Normal range of motion. Neck supple. No JVD present. No tracheal deviation present. No thyromegaly present.  Cardiovascular: Normal rate and regular rhythm.   Murmur (3/6 aortic ejection) heard. Pulmonary/Chest: Effort normal. No stridor. No respiratory distress. She has no wheezes. She has no rales. She exhibits no tenderness.  Abdominal: Soft. Bowel sounds are normal. She exhibits no distension. There is no tenderness. There is no rebound and no guarding.  Genitourinary: Guaiac negative stool.  Musculoskeletal: Normal range of motion. She exhibits edema. She exhibits no tenderness.  Mild edema entire left leg has improved since the left hip surgery.  BLE edema L1+, R trace only.   Lymphadenopathy:    She has no cervical adenopathy.  Neurological: She is alert and oriented to person, place, and time. She displays normal reflexes. No cranial nerve deficit. She exhibits normal muscle tone. Coordination normal.  Skin: Skin is warm and dry. No rash noted. She is not diaphoretic. No erythema. No pallor.  Let hip surgical scar.  Small keratotic lesions anterior LLE and calf witch is injured with serous fluid leakage. No erythema, warmth, odorous drainage, and tenderness.  Psychiatric: She has a normal mood and affect. Her behavior is normal. Judgment and thought content normal.     Labs reviewed: Lab on 07/14/2014  Component Date Value Ref Range Status  . Hemoglobin 07/13/2014 10.6* 12.0 - 16.0 g/dL Final  . HCT 16/10/960409/22/2015 32* 36 - 46 %  Final  . Platelets 07/13/2014 402* 150 - 399 K/L Final  . WBC 07/13/2014 9.4   Final  . Glucose 07/13/2014 98   Final  . BUN 07/13/2014 38* 4 - 21 mg/dL Final  . Creatinine 54/09/811909/22/2015 1.1  0.5 - 1.1 mg/dL Final  . Potassium 14/78/295609/22/2015 5.5* 3.4 - 5.3 mmol/L Final  . Sodium 07/13/2014 136* 137 - 147 mmol/L Final  . Alkaline Phosphatase 07/13/2014 58  25 - 125 U/L Final  . ALT 07/13/2014 9  7 - 35 U/L Final  . AST 07/13/2014 15  13 - 35 U/L Final  . Bilirubin, Total 07/13/2014 0.6   Final  Admission on 06/30/2014, Discharged on 07/06/2014  No results displayed because visit has over 200 results.       Assessment/Plan  1. Diarrhea -DC Colace -Stool  for C. difficile toxin assay  2. Chronic kidney disease, stage II (mild) -CMP  3. Anemia of chronic disease -DC ferrous sulfate -CBC  4. Essential hypertension Controlled  5. Elevated alkaline phosphatase -CMP

## 2014-09-27 ENCOUNTER — Encounter: Payer: Self-pay | Admitting: Nurse Practitioner

## 2014-09-27 ENCOUNTER — Non-Acute Institutional Stay (SKILLED_NURSING_FACILITY): Payer: Medicare PPO | Admitting: Nurse Practitioner

## 2014-09-27 DIAGNOSIS — K219 Gastro-esophageal reflux disease without esophagitis: Secondary | ICD-10-CM

## 2014-09-27 DIAGNOSIS — E039 Hypothyroidism, unspecified: Secondary | ICD-10-CM

## 2014-09-27 DIAGNOSIS — B0229 Other postherpetic nervous system involvement: Secondary | ICD-10-CM

## 2014-09-27 DIAGNOSIS — N182 Chronic kidney disease, stage 2 (mild): Secondary | ICD-10-CM

## 2014-09-27 DIAGNOSIS — I10 Essential (primary) hypertension: Secondary | ICD-10-CM

## 2014-09-27 DIAGNOSIS — D638 Anemia in other chronic diseases classified elsewhere: Secondary | ICD-10-CM

## 2014-09-27 NOTE — Assessment & Plan Note (Signed)
Stable, continue Omeprazole 20mg daily.  

## 2014-09-27 NOTE — Assessment & Plan Note (Signed)
controlled on a low-dose Coreg 3.125mg bid.    

## 2014-09-27 NOTE — Assessment & Plan Note (Signed)
Takes Levothyroxine 75mcg daily. 03/23/14 TSH 2.553. 05/20/14 TSH 3.224   

## 2014-09-27 NOTE — Assessment & Plan Note (Signed)
Continued on home pain regimen of fentanyl patch 4212mcg/hr  and gabapentin 100mg  tid, and Tramadol 50mg  bid, and Tylenol 650mg  tid.

## 2014-09-27 NOTE — Assessment & Plan Note (Addendum)
05/20/14 Hgb 11.9 07/13/14 Hgb 10.6 Takes Fe 325mg  daily.  09/28/14 Hgb 10.3-dc Fe, repeat CBC and Fe in one month.

## 2014-09-27 NOTE — Progress Notes (Signed)
Patient ID: Kim Hodges, female   DOB: 09/12/1909, [age] y.o.   MRN: [id number]   Code Status: DNR  No Known Allergies  Chief Complaint  Patient presents with  . Medical Management of Chronic Issues    HPI: Patient is a [age] y.o. female [facility] of chronic medical conditions.     Hospitalized 06/30/2014-07/06/2014 for s/p fall and resultants of left hip fx, T1 compression fracture, C7 lamina fracture. She is s/p IM nail L intertrochanteric femur 07/02/14 Orthopedics: Dr Lequita HaltAluisio 009/06/2014 -Lovenox to ASA 325mg -ASA 81mg  for post op DVT/PE prophylaxis. T1 compression fracture of C7 lamina fracture-Aspen neck collar, pneumonia-[facility]   Problem List Items Addressed This Visit    Post herpetic neuralgia (Chronic)    Continued on home pain regimen of fentanyl patch 212mcg/hr  and gabapentin 100mg  tid, and Tramadol 50mg  bid, and Tylenol 650mg  tid.       Hypothyroidism (Chronic)    Takes Levothyroxine 75mcg daily. 03/23/14 TSH 2.553. 05/20/14 TSH 3.224       GERD (gastroesophageal reflux disease)    Stable, continue Omeprazole 20mg  daily.       Essential hypertension - Primary    controlled on a low-dose Coreg 3.125mg  bid.        Chronic kidney disease, stage II (mild) (Chronic)    07/13/14 Bun/creat 38/1.1 09/28/14 Bun/creat 34/0.85    Anemia of chronic disease (Chronic)    05/20/14 Hgb 11.9 07/13/14 Hgb 10.6 Takes Fe 325mg  daily.  09/28/14 Hgb 10.3-dc Fe, repeat CBC and Fe in one month.         Review of Systems: Review of Systems  Constitutional: Positive for weight loss. Negative for fever, chills, malaise/fatigue and diaphoresis.       06/2014-07/2014 # 120, 116, and 115.   HENT: Positive for hearing loss. Negative for congestion, ear discharge, ear pain, nosebleeds, sore throat and tinnitus.        Mild HOH  Eyes: Negative for blurred vision, double vision, pain, discharge and redness.    Glaucoma R+L  Respiratory: Positive for cough. Negative for hemoptysis, sputum production, shortness of breath, wheezing and stridor.        Chronic  Cardiovascular: Positive for leg swelling. Negative for chest pain, palpitations, orthopnea, claudication and PND.       LLE L>R  Gastrointestinal: Negative for heartburn, nausea, vomiting, abdominal pain, diarrhea, constipation, blood in stool and melena.  Genitourinary: Positive for frequency. Negative for dysuria, urgency, hematuria and flank pain.       X1-x2 per night.   Musculoskeletal: Positive for joint pain, falls and neck pain. Negative for myalgias and back pain.       Ambulates with walker. C/o upper left scapular area pain for a 5-6 days following a fall in her bathroom a few days earlier. Constant pain in moderate severity. Woke up a few times last night due to her discomfort. Pain is aching in nature and radiates acrose her upper thoracic spine to the  right upper scapula at the same level and to the left arm.  Neck and left hip pain are well controlled presently.  Overall her pain is well managed with Fentanyl 1812mcg/hr patch.   Skin: Negative for itching and rash.       Let hip surgical incision healed. Small keratotic lesions anterior LLE and calf witch is injured with serous fluid leakage. No erythema, warmth, odorous drainage, and tenderness.  Neurological: Negative for dizziness, tingling, tremors, sensory change, speech change, focal weakness, seizures, loss of consciousness, weakness and headaches.  Endo/Heme/Allergies: Negative for environmental allergies and polydipsia. Does not bruise/bleed easily.  Psychiatric/Behavioral: Positive for memory loss. Negative for depression, suicidal ideas, hallucinations and substance abuse. The patient is not nervous/anxious and does not have insomnia.      Past Medical History  Diagnosis Date  . Chronic kidney disease, stage II (mild) 12/25/2012  . Unspecified arthropathy, lower leg  09/11/2011  . Swelling of limb 09/10/2012  . Other and unspecified hyperlipidemia 07/26/2011  . Actinic keratosis 07/26/2011  . Abnormality of gait 07/26/2011  . Hyperglycemia 07/26/2011  . Tear film insufficiency, unspecified 12/29/2009  . Other malaise and fatigue 12/29/2009  . Unspecified hearing loss 06/30/2009  . Unspecified hypothyroidism 12/15/2008  . Unspecified essential hypertension 12/15/2008  . Unspecified glaucoma 12/15/2008    both eyes  . Weak 08/02/13  . Hyponatremia 08/02/13  . Herpes zoster 08/02/13  . Post herpetic neuralgia 08/02/13  . Cerebral atrophy 08/02/13  . Cerebrovascular disease 08/02/13  . Atherosclerosis 08/02/13  . Abnormal chest x-ray 08/02/13    08/02/13 nodule in left mid to upper lung  . Murmur, cardiac 08/02/13    aortic ejection  . Fracture lumbar vertebra-closed 08/02/13    L1  . Memory changes 07/03/13  . Lung mass 07/01/2014  . Intertrochanteric fracture of left femur 06/30/14  . Fracture of T1 vertebra 06/30/14  . Fracture of C7 vertebra, closed 06/30/14  . History of fall 06/30/14  . GERD (gastroesophageal reflux disease) 06/03/2014  . Unspecified constipation 07/07/2014   Past Surgical History  Procedure Laterality Date  . Small intestine surgery  1957  . Cardiac catheterization  2004  . Gallbladder surgery  2006  . Cholecystectomy    . Femur im nail Left 07/02/2014    Procedure: INTRAMEDULLARY (IM) NAIL FEMORAL;  Surgeon: Loanne Drilling, MD;  Location: [facility];  Service: Orthopedics;  Laterality: Left;   Social History:   reports that she has never smoked. She has never used smokeless tobacco. She reports that she does not drink alcohol or use illicit drugs.  Family History  Problem Relation Age of Onset  . Cancer Sister   . Cancer Brother     pancreatic  . Cancer Son     lung  . Cancer Brother     pancreatic    Medications: Patient's Medications  New Prescriptions   No medications on file  Previous Medications   ACETAMINOPHEN  (TYLENOL ARTHRITIS PAIN) 650 MG CR TABLET    Take 650 mg by mouth 3 (three) times daily as needed for pain. May also take one tablet every 6 hours as needed for pain   CALCIUM CARB-CHOLECALCIFEROL (CALCIUM 500 +D) 500-400 MG-UNIT TABS    Take 1 tablet by mouth daily.   CARVEDILOL (COREG) 3.125 MG TABLET    Take 1 tablet (3.125 mg total) by mouth 2 (two) times daily with a meal.   DOCUSATE SODIUM 100 MG CAPS    Take 100 mg by mouth 2 (two) times daily.   DORZOLAMIDE-TIMOLOL (COSOPT) 22.3-6.8 MG/ML OPHTHALMIC SOLUTION    Place 1 drop into both eyes 2 (two) times daily.    ENOXAPARIN (LOVENOX) 30 MG/0.3ML INJECTION    Inject 0.3 mLs (30 mg total) into the skin daily. Take injections for seven more days and then switch over to a 325 mg Aspirin daily for three more weeks.   FEEDING SUPPLEMENT, ENSURE COMPLETE, (ENSURE COMPLETE) LIQD  Take 237 mLs by mouth 2 (two) times daily between meals.   FENTANYL (DURAGESIC - DOSED MCG/HR) 12 MCG/HR    Place 1 patch (12.5 mcg total) onto the skin every 3 (three) days. Remove old patch   FERROUS SULFATE 325 (65 FE) MG TABLET    Take 1 tablet (325 mg total) by mouth daily with breakfast.   GUAIFENESIN (MUCINEX) 600 MG 12 HR TABLET    Take 1 tablet (600 mg total) by mouth 2 (two) times daily. Take for 6 days then stop.   HYDROCODONE-ACETAMINOPHEN (NORCO/VICODIN) 5-325 MG PER TABLET       LEVOTHYROXINE (SYNTHROID, LEVOTHROID) 75 MCG TABLET    Take 75 mcg by mouth daily before breakfast.   OMEPRAZOLE (PRILOSEC) 20 MG CAPSULE    Take 20 mg by mouth daily.   SENNA (SENOKOT) 8.6 MG TABS TABLET    Take 1 tablet (8.6 mg total) by mouth 2 (two) times daily.   SPECIALTY VITAMINS PRODUCTS (ICAPS LUTEIN-ZEAXANTHIN PO)    Take 1 tablet by mouth daily.   TRAMADOL (ULTRAM) 50 MG TABLET    Take one tablet by mouth twice daily for pain  Modified Medications   No medications on file  Discontinued Medications   No medications on file     Physical Exam: Physical Exam    Constitutional: She is oriented to person, place, and time. She appears well-developed and well-nourished. No distress.  HENT:  Head: Normocephalic and atraumatic.  Right Ear: External ear normal.  Left Ear: External ear normal.  Nose: Nose normal.  Mouth/Throat: Oropharynx is clear and moist. No oropharyngeal exudate.  Eyes: Conjunctivae and EOM are normal. Pupils are equal, round, and reactive to light. Right eye exhibits no discharge. Left eye exhibits no discharge. No scleral icterus.  Neck: Normal range of motion. Neck supple. No JVD present. No tracheal deviation present. No thyromegaly present.  Cardiovascular: Normal rate and regular rhythm.   Murmur heard. 3/6  Pulmonary/Chest: Effort normal. No stridor. No respiratory distress. She has no wheezes. She has no rales. She exhibits no tenderness.  Abdominal: Soft. Bowel sounds are normal. She exhibits no distension. There is no tenderness. There is no rebound and no guarding.  Genitourinary: Guaiac negative stool.  Musculoskeletal: Normal range of motion. She exhibits edema. She exhibits no tenderness.  Ambulates with walker. C/o upper left scapular area pain for a 5-6 days following a fall in her bathroom a few days earlier. Constant pain in moderate severity. Woke up a few times last night due to her discomfort. Pain is aching in nature and radiates acrose her upper thoracic spine to the  right upper scapula at the same level and to the left arm. Mild edema entire left leg has improved since the left hip surgery.  Pain is well managed with Fentanyl 1812mcg/hr patch.  BLE edema L1+, R trace only.   Lymphadenopathy:    She has no cervical adenopathy.  Neurological: She is alert and oriented to person, place, and time. She displays normal reflexes. No cranial nerve deficit. She exhibits normal muscle tone. Coordination normal.  Skin: Skin is warm and dry. No rash noted. She is not diaphoretic. No erythema. No pallor.  Let hip surgical  scar.  Small keratotic lesions anterior LLE and calf witch is injured with serous fluid leakage. No erythema, warmth, odorous drainage, and tenderness.     Psychiatric: She has a normal mood and affect. Her behavior is normal. Judgment and thought content normal.    Filed Vitals:  09/27/14 1128  BP: 146/62  Pulse: 66  Temp: 98.6 F (37 C)  TempSrc: Tympanic  Resp: 20      Labs reviewed: Basic Metabolic Panel:  Recent Labs  16/10/96 05/20/14  07/02/14 0433  07/04/14 0445 07/05/14 0445 07/06/14 0425 07/13/14 09/28/14  NA  --  135*  < >  --   < > 132* 135* 131* 136* 137  K  --  4.1  < >  --   < > 3.8 4.3 4.1 5.5* 4.2  CL  --   --   < >  --   < > 98 99 96  --   --   CO2  --   --   < >  --   < > 21 24 22   --   --   GLUCOSE  --   --   < >  --   < > 117* 121* 110*  --   --   BUN  --  25*  < >  --   < > 25* 35* 37* 38* 34*  CREATININE  --  1.0  < >  --   < > 0.90 1.15* 1.01 1.1 0.8  CALCIUM  --   --   < >  --   < > 8.0* 8.0* 8.0*  --   --   TSH 2.55 3.22  --  1.990  --   --   --   --   --   --   < > = values in this interval not displayed. Liver Function Tests:  Recent Labs  07/01/14 0454 07/13/14 09/28/14  AST 17 15 14   ALT 7 9 8   ALKPHOS 63 58 72  BILITOT 0.5  --   --   PROT 6.5  --   --   ALBUMIN 3.2*  --   --    CBC:  Recent Labs  06/30/14 1807 07/01/14 0632  07/04/14 2130 07/05/14 0445 07/06/14 0425 07/13/14 09/28/14  WBC 8.4 9.7  < > 12.0* 12.7* 11.6* 9.4 9.1  NEUTROABS 5.8 6.9  --   --   --   --   --   --   HGB 10.6* 9.8*  < > 11.0* 10.5* 10.6* 10.6* 10.3*  HCT 32.7* 29.3*  < > 33.3* 30.7* 31.3* 32* 31*  MCV 91.1 89.6  < > 89.3 88.5 88.4  --   --   PLT 241 203  < > 189 205 250 402* 334  < > = values in this interval not displayed.  Past Procedures:  09/29/13 Echocardiogram: moderate AI, moderate to severe MR, moderate TR, moderate pulmonary hypertension, mild LLH, hyperdynamic LV systolic function, EF 75%.    05/24/14 CT chest w/o  contrast:  IMPRESSION:  Enlarging masslike density in the area of prior linear scarring on  2009 chest CT. Findings are concerning for possible scar carcinoma.  This could be further evaluated with PET CT or tissue biopsy if felt  clinically indicated.  Trace left pleural effusion.  Mild cardiomegaly.  Small hiatal hernia.  Borderline low right paratracheal lymph node.  06/2014 hospital:   CT C-spine 06/30/2014  X-ray of the left hip 06/30/2014  X-ray of the right foot 06/30/2014  Chest x-ray 06/30/2001, 07/04/2014  X-ray of the right ankle 06/30/2014  2 d echo 07/01/14   Assessment/Plan Post herpetic neuralgia Continued on home pain regimen of fentanyl patch 8mcg/hr  and gabapentin 100mg  tid, and Tramadol 50mg  bid, and Tylenol 650mg  tid.  Hypothyroidism Takes Levothyroxine daily. 03/23/14 TSH 2.553. 05/20/14 TSH 3.224     GERD (gastroesophageal reflux disease) Stable, continue Omeprazole 20mg  daily.     Anemia of chronic disease 05/20/14 Hgb 11.9 07/13/14 Hgb 10.6 Takes Fe 325mg  daily.  09/28/14 Hgb 10.3-dc Fe, repeat CBC and Fe in one month.    Essential hypertension controlled on a low-dose Coreg 3.125mg  bid.      Chronic kidney disease, stage II (mild) 07/13/14 Bun/creat 38/1.1 09/28/14 Bun/creat 34/0.85    Family/ Staff Communication: observe the patient.   Goals of Care: SNF  Labs/tests ordered: none

## 2014-09-28 LAB — HEPATIC FUNCTION PANEL
ALT: 8 U/L (ref 7–35)
AST: 14 U/L (ref 13–35)
Alkaline Phosphatase: 72 U/L (ref 25–125)
BILIRUBIN, TOTAL: 0.5 mg/dL

## 2014-09-28 LAB — CBC AND DIFFERENTIAL
HCT: 31 % — AB (ref 36–46)
HEMOGLOBIN: 10.3 g/dL — AB (ref 12.0–16.0)
PLATELETS: 334 10*3/uL (ref 150–399)
WBC: 9.1 10^3/mL

## 2014-09-28 LAB — BASIC METABOLIC PANEL
BUN: 34 mg/dL — AB (ref 4–21)
Creatinine: 0.8 mg/dL (ref 0.5–1.1)
GLUCOSE: 101 mg/dL
Potassium: 4.2 mmol/L (ref 3.4–5.3)
Sodium: 137 mmol/L (ref 137–147)

## 2014-09-29 NOTE — Assessment & Plan Note (Signed)
07/13/14 Bun/creat 38/1.1 09/28/14 Bun/creat 34/0.85

## 2014-10-27 ENCOUNTER — Encounter: Payer: Self-pay | Admitting: Nurse Practitioner

## 2014-10-27 ENCOUNTER — Non-Acute Institutional Stay (SKILLED_NURSING_FACILITY): Payer: Medicare PPO | Admitting: Nurse Practitioner

## 2014-10-27 DIAGNOSIS — I10 Essential (primary) hypertension: Secondary | ICD-10-CM

## 2014-10-27 DIAGNOSIS — K5900 Constipation, unspecified: Secondary | ICD-10-CM

## 2014-10-27 DIAGNOSIS — K219 Gastro-esophageal reflux disease without esophagitis: Secondary | ICD-10-CM

## 2014-10-27 DIAGNOSIS — N182 Chronic kidney disease, stage 2 (mild): Secondary | ICD-10-CM

## 2014-10-27 DIAGNOSIS — D638 Anemia in other chronic diseases classified elsewhere: Secondary | ICD-10-CM

## 2014-10-27 DIAGNOSIS — S12600S Unspecified displaced fracture of seventh cervical vertebra, sequela: Secondary | ICD-10-CM

## 2014-10-27 DIAGNOSIS — R609 Edema, unspecified: Secondary | ICD-10-CM

## 2014-10-27 DIAGNOSIS — E039 Hypothyroidism, unspecified: Secondary | ICD-10-CM

## 2014-10-27 DIAGNOSIS — B0229 Other postherpetic nervous system involvement: Secondary | ICD-10-CM

## 2014-10-27 NOTE — Progress Notes (Signed)
Patient ID: Kim Hodges, female   DOB: 05/12/1909, [age] y.o.   MRN: [id number]   Code Status: DNR  No Known Allergies  Chief Complaint  Patient presents with  . Medical Management of Chronic Issues    HPI: Patient is a [age] y.o. female seen in the SNF [facility] today for evaluation of chronic medical conditions.     Hospitalized 06/30/2014-07/06/2014 for s/p fall and resultants of left hip fx, T1 compression fracture, C7 lamina fracture. She is s/p IM nail L intertrochanteric femur 07/02/14 Orthopedics: Dr Lequita HaltAluisio 009/06/2014 -Lovenox to ASA 325mg -ASA 81mg  for post op DVT/PE prophylaxis. T1 compression fracture of C7 lamina fracture-Aspen neck collar, pneumonia-complete 6 days of Augmentin at [facility]   Problem List Items Addressed This Visit    Post herpetic neuralgia (Chronic)    Continued on home pain regimen of fentanyl patch 5912mcg/hr  and gabapentin 100mg  tid, and Tramadol 50mg  bid, and Tylenol 650mg  tid.        Hypothyroidism (Chronic)    Takes Levothyroxine 75mcg daily. 03/23/14 TSH 2.553. 05/20/14 TSH 3.224      GERD (gastroesophageal reflux disease)    Stable, continue Omeprazole 20mg  daily.       Essential hypertension    controlled on a low-dose Coreg 3.125mg  bid.      Edema    Trace BLE    Constipation    08/25/14 dc'd Senokot continued Colace.       Chronic kidney disease, stage II (mild) (Chronic)    07/13/14 Bun/creat 38/1.1 09/28/14 Bun/creat 34/0.85     C7 cervical fracture    Continue cervical collar.      Anemia of chronic disease - Primary (Chronic)    05/20/14 Hgb 11.9 07/13/14 Hgb 10.6 09/28/14 Hgb 10.3-dc Fe, repeat CBC and Fe in one month.       Review of Systems: Review of Systems  Constitutional: Positive for weight loss. Negative for fever, chills, malaise/fatigue and diaphoresis.       06/2014-07/2014 # 120, 116, and 115.   HENT: Positive for hearing loss. Negative for congestion, ear discharge, ear pain, nosebleeds, sore  throat and tinnitus.        Mild HOH  Eyes: Negative for blurred vision, double vision, pain, discharge and redness.       Glaucoma R+L  Respiratory: Positive for cough. Negative for hemoptysis, sputum production, shortness of breath, wheezing and stridor.        Chronic  Cardiovascular: Positive for leg swelling. Negative for chest pain, palpitations, orthopnea, claudication and PND.       LLE L>R trace  Gastrointestinal: Negative for heartburn, nausea, vomiting, abdominal pain, diarrhea, constipation, blood in stool and melena.  Genitourinary: Positive for frequency. Negative for dysuria, urgency, hematuria and flank pain.       X1-x2 per night.   Musculoskeletal: Positive for joint pain, falls and neck pain. Negative for myalgias and back pain.       Ambulates with walker. C/o upper left scapular area pain for a 5-6 days following a fall in her bathroom a few days earlier. Constant pain in moderate severity. Woke up a few times last night due to her discomfort. Pain is aching in nature and radiates acrose her upper thoracic spine to the  right upper scapula at the same level and to the left arm.  Neck and left hip pain are well controlled presently.  Overall her pain is well managed with Fentanyl 3612mcg/hr patch.   Skin: Negative for itching  and rash.       Let hip surgical incision healed. Small keratotic lesions anterior LLE and calf witch is injured with serous fluid leakage. No erythema, warmth, odorous drainage, and tenderness.    Neurological: Negative for dizziness, tingling, tremors, sensory change, speech change, focal weakness, seizures, loss of consciousness, weakness and headaches.  Endo/Heme/Allergies: Negative for environmental allergies and polydipsia. Does not bruise/bleed easily.  Psychiatric/Behavioral: Positive for memory loss. Negative for depression, suicidal ideas, hallucinations and substance abuse. The patient is not nervous/anxious and does not have insomnia.       Past Medical History  Diagnosis Date  . Chronic kidney disease, stage II (mild) 12/25/2012  . Unspecified arthropathy, lower leg 09/11/2011  . Swelling of limb 09/10/2012  . Other and unspecified hyperlipidemia 07/26/2011  . Actinic keratosis 07/26/2011  . Abnormality of gait 07/26/2011  . Hyperglycemia 07/26/2011  . Tear film insufficiency, unspecified 12/29/2009  . Other malaise and fatigue 12/29/2009  . Unspecified hearing loss 06/30/2009  . Unspecified hypothyroidism 12/15/2008  . Unspecified essential hypertension 12/15/2008  . Unspecified glaucoma 12/15/2008    both eyes  . Weak 08/02/13  . Hyponatremia 08/02/13  . Herpes zoster 08/02/13  . Post herpetic neuralgia 08/02/13  . Cerebral atrophy 08/02/13  . Cerebrovascular disease 08/02/13  . Atherosclerosis 08/02/13  . Abnormal chest x-ray 08/02/13    08/02/13 nodule in left mid to upper lung  . Murmur, cardiac 08/02/13    aortic ejection  . Fracture lumbar vertebra-closed 08/02/13    L1  . Memory changes 07/03/13  . Lung mass 07/01/2014  . Intertrochanteric fracture of left femur 06/30/14  . Fracture of T1 vertebra 06/30/14  . Fracture of C7 vertebra, closed 06/30/14  . History of fall 06/30/14  . GERD (gastroesophageal reflux disease) 06/03/2014  . Unspecified constipation 07/07/2014   Past Surgical History  Procedure Laterality Date  . Small intestine surgery  1957  . Cardiac catheterization  2004  . Gallbladder surgery  2006  . Cholecystectomy    . Femur im nail Left 07/02/2014    Procedure: INTRAMEDULLARY (IM) NAIL FEMORAL;  Surgeon: Loanne Drilling, MD;  Location: [facility];  Service: Orthopedics;  Laterality: Left;   Social History:   reports that she has never smoked. She has never used smokeless tobacco. She reports that she does not drink alcohol or use illicit drugs.  Family History  Problem Relation Age of Onset  . Cancer Sister   . Cancer Brother     pancreatic  . Cancer Son     lung  . Cancer Brother      pancreatic    Medications: Patient's Medications  New Prescriptions   No medications on file  Previous Medications   ACETAMINOPHEN (TYLENOL ARTHRITIS PAIN) 650 MG CR TABLET    Take 650 mg by mouth 3 (three) times daily as needed for pain. May also take one tablet every 6 hours as needed for pain   CALCIUM CARB-CHOLECALCIFEROL (CALCIUM 500 +D) 500-400 MG-UNIT TABS    Take 1 tablet by mouth daily.   CARVEDILOL (COREG) 3.125 MG TABLET    Take 1 tablet (3.125 mg total) by mouth 2 (two) times daily with a meal.   DOCUSATE SODIUM 100 MG CAPS    Take 100 mg by mouth 2 (two) times daily.   DORZOLAMIDE-TIMOLOL (COSOPT) 22.3-6.8 MG/ML OPHTHALMIC SOLUTION    Place 1 drop into both eyes 2 (two) times daily.    ENOXAPARIN (LOVENOX) 30 MG/0.3ML INJECTION    Inject 0.3  mLs (30 mg total) into the skin daily. Take injections for seven more days and then switch over to a 325 mg Aspirin daily for three more weeks.   FEEDING SUPPLEMENT, ENSURE COMPLETE, (ENSURE COMPLETE) LIQD    Take 237 mLs by mouth 2 (two) times daily between meals.   FENTANYL (DURAGESIC - DOSED MCG/HR) 12 MCG/HR    Place 1 patch (12.5 mcg total) onto the skin every 3 (three) days. Remove old patch   FERROUS SULFATE 325 (65 FE) MG TABLET    Take 1 tablet (325 mg total) by mouth daily with breakfast.   GUAIFENESIN (MUCINEX) 600 MG 12 HR TABLET    Take 1 tablet (600 mg total) by mouth 2 (two) times daily. Take for 6 days then stop.   HYDROCODONE-ACETAMINOPHEN (NORCO/VICODIN) 5-325 MG PER TABLET       LEVOTHYROXINE (SYNTHROID, LEVOTHROID) 75 MCG TABLET    Take 75 mcg by mouth daily before breakfast.   OMEPRAZOLE (PRILOSEC) 20 MG CAPSULE    Take 20 mg by mouth daily.   SENNA (SENOKOT) 8.6 MG TABS TABLET    Take 1 tablet (8.6 mg total) by mouth 2 (two) times daily.   SPECIALTY VITAMINS PRODUCTS (ICAPS LUTEIN-ZEAXANTHIN PO)    Take 1 tablet by mouth daily.   TRAMADOL (ULTRAM) 50 MG TABLET    Take one tablet by mouth twice daily for pain  Modified  Medications   No medications on file  Discontinued Medications   No medications on file     Physical Exam: Physical Exam  Constitutional: She is oriented to person, place, and time. She appears well-developed and well-nourished. No distress.  HENT:  Head: Normocephalic and atraumatic.  Right Ear: External ear normal.  Left Ear: External ear normal.  Nose: Nose normal.  Mouth/Throat: Oropharynx is clear and moist. No oropharyngeal exudate.  Eyes: Conjunctivae and EOM are normal. Pupils are equal, round, and reactive to light. Right eye exhibits no discharge. Left eye exhibits no discharge. No scleral icterus.  Neck: Normal range of motion. Neck supple. No JVD present. No tracheal deviation present. No thyromegaly present.  Cardiovascular: Normal rate and regular rhythm.   Murmur heard. 3/6  Pulmonary/Chest: Effort normal. No stridor. No respiratory distress. She has no wheezes. She has no rales. She exhibits no tenderness.  Abdominal: Soft. Bowel sounds are normal. She exhibits no distension. There is no tenderness. There is no rebound and no guarding.  Genitourinary: Guaiac negative stool.  Musculoskeletal: Normal range of motion. She exhibits edema. She exhibits no tenderness.  Ambulates with walker. C/o upper left scapular area pain for a 5-6 days following a fall in her bathroom a few days earlier. Constant pain in moderate severity. Woke up a few times last night due to her discomfort. Pain is aching in nature and radiates acrose her upper thoracic spine to the  right upper scapula at the same level and to the left arm. Mild edema entire left leg has improved since the left hip surgery.   Trace edema BLE Pain is well managed with Fentanyl 74mcg/hr patch.  BLE edema L1+, R trace only.   Lymphadenopathy:    She has no cervical adenopathy.  Neurological: She is alert and oriented to person, place, and time. She displays normal reflexes. No cranial nerve deficit. She exhibits normal  muscle tone. Coordination normal.  Skin: Skin is warm and dry. No rash noted. She is not diaphoretic. No erythema. No pallor.  Let hip surgical scar.  Small keratotic lesions anterior  LLE and calf witch is injured with serous fluid leakage. No erythema, warmth, odorous drainage, and tenderness.     Psychiatric: She has a normal mood and affect. Her behavior is normal. Judgment and thought content normal.    Filed Vitals:   10/27/14 1602  BP: 102/86  Pulse: 90  Temp: 98.4 F (36.9 C)  TempSrc: Tympanic  Resp: 20      Labs reviewed: Basic Metabolic Panel:  Recent Labs  16/10/96 05/20/14  07/02/14 0433  07/04/14 0445 07/05/14 0445 07/06/14 0425 07/13/14 09/28/14  NA  --  135*  < >  --   < > 132* 135* 131* 136* 137  K  --  4.1  < >  --   < > 3.8 4.3 4.1 5.5* 4.2  CL  --   --   < >  --   < > 98 99 96  --   --   CO2  --   --   < >  --   < > --   --   GLUCOSE  --   --   < >  --   < > 117* 121* 110*  --   --   BUN  --  25*  < >  --   < > 25* 35* 37* 38* 34*  CREATININE  --  1.0  < >  --   < > 0.90 1.15* 1.01 1.1 0.8  CALCIUM  --   --   < >  --   < > 8.0* 8.0* 8.0*  --   --   TSH 2.55 3.22  --  1.990  --   --   --   --   --   --   < > = values in this interval not displayed. Liver Function Tests:  Recent Labs  07/01/14 0454 07/13/14 09/28/14  AST ALT ALKPHOS 63 58 72  BILITOT 0.5  --   --   PROT 6.5  --   --   ALBUMIN 3.2*  --   --    CBC:  Recent Labs  06/30/14 1807 07/01/14 0632  07/04/14 2130 07/05/14 0445 07/06/14 0425 07/13/14 09/28/14  WBC 8.4 9.7  < > 12.0* 12.7* 11.6* 9.4 9.1  NEUTROABS 5.8 6.9  --   --   --   --   --   --   HGB 10.6* 9.8*  < > 11.0* 10.5* 10.6* 10.6* 10.3*  HCT 32.7* 29.3*  < > 33.3* 30.7* 31.3* 32* 31*  MCV 91.1 89.6  < > 89.3 88.5 88.4  --   --   PLT 241 203  < > 189 205 250 402* 334  < > = values in this interval not displayed.  Past Procedures:  09/29/13 Echocardiogram: moderate AI, moderate to severe  MR, moderate TR, moderate pulmonary hypertension, mild LLH, hyperdynamic LV systolic function, EF 75%.    05/24/14 CT chest w/o contrast:  IMPRESSION:  Enlarging masslike density in the area of prior linear scarring on  2009 chest CT. Findings are concerning for possible scar carcinoma.  This could be further evaluated with PET CT or tissue biopsy if felt  clinically indicated.  Trace left pleural effusion.  Mild cardiomegaly.  Small hiatal hernia.  Borderline low right paratracheal lymph node.  06/2014 hospital:   CT C-spine 06/30/2014  X-ray of the left hip 06/30/2014  X-ray of the right foot 06/30/2014  Chest x-ray 06/30/2001, 07/04/2014  X-ray of the right ankle 06/30/2014  2 d echo 07/01/14   Assessment/Plan Anemia of chronic disease 05/20/14 Hgb 11.9 07/13/14 Hgb 10.6 09/28/14 Hgb 10.3-dc Fe, repeat CBC and Fe in one month.  C7 cervical fracture Continue cervical collar.    Post herpetic neuralgia Continued on home pain regimen of fentanyl patch 75mcg/hr  and gabapentin 100mg  tid, and Tramadol 50mg  bid, and Tylenol 650mg  tid.      Hypothyroidism Takes Levothyroxine daily. 03/23/14 TSH 2.553. 05/20/14 TSH 3.224    GERD (gastroesophageal reflux disease) Stable, continue Omeprazole 20mg  daily.     Essential hypertension controlled on a low-dose Coreg 3.125mg  bid.    Edema Trace BLE  Constipation 08/25/14 dc'd Senokot continued Colace.     Chronic kidney disease, stage II (mild) 07/13/14 Bun/creat 38/1.1 09/28/14 Bun/creat 34/0.85     Family/ Staff Communication: observe the patient.   Goals of Care: SNF  Labs/tests ordered: none

## 2014-10-28 LAB — CBC AND DIFFERENTIAL
HEMATOCRIT: 33 % — AB (ref 36–46)
Hemoglobin: 10.5 g/dL — AB (ref 12.0–16.0)
Platelets: 231 10*3/uL (ref 150–399)
WBC: 7.3 10*3/mL

## 2014-10-28 NOTE — Assessment & Plan Note (Signed)
Continued on home pain regimen of fentanyl patch 12mcg/hr  and gabapentin 100mg tid, and Tramadol 50mg bid, and Tylenol 650mg tid.     

## 2014-10-28 NOTE — Assessment & Plan Note (Signed)
07/13/14 Bun/creat 38/1.1 09/28/14 Bun/creat 34/0.85 

## 2014-10-28 NOTE — Assessment & Plan Note (Signed)
05/20/14 Hgb 11.9 07/13/14 Hgb 10.6 09/28/14 Hgb 10.3-dc Fe, repeat CBC and Fe in one month.

## 2014-10-28 NOTE — Assessment & Plan Note (Signed)
Continue cervical collar.  

## 2014-10-28 NOTE — Assessment & Plan Note (Signed)
controlled on a low-dose Coreg 3.125mg bid.    

## 2014-10-28 NOTE — Assessment & Plan Note (Signed)
Trace BLE 

## 2014-10-28 NOTE — Assessment & Plan Note (Signed)
Stable, continue Omeprazole 20mg daily.  

## 2014-10-28 NOTE — Assessment & Plan Note (Signed)
Takes Levothyroxine 75mcg daily. 03/23/14 TSH 2.553. 05/20/14 TSH 3.224   

## 2014-10-28 NOTE — Assessment & Plan Note (Signed)
08/25/14 dc'd Senokot continued Colace.   

## 2014-11-01 ENCOUNTER — Other Ambulatory Visit: Payer: Self-pay | Admitting: Nurse Practitioner

## 2014-11-01 DIAGNOSIS — D638 Anemia in other chronic diseases classified elsewhere: Secondary | ICD-10-CM

## 2014-11-04 ENCOUNTER — Encounter (HOSPITAL_COMMUNITY): Payer: Self-pay | Admitting: Orthopedic Surgery

## 2014-11-15 ENCOUNTER — Other Ambulatory Visit: Payer: Self-pay | Admitting: *Deleted

## 2014-11-15 MED ORDER — FENTANYL 12 MCG/HR TD PT72: 12.5000 ug | MEDICATED_PATCH | TRANSDERMAL | Status: AC

## 2014-11-15 NOTE — Telephone Encounter (Signed)
[facility] 

## 2014-11-24 ENCOUNTER — Non-Acute Institutional Stay (SKILLED_NURSING_FACILITY): Payer: Medicare PPO | Admitting: Nurse Practitioner

## 2014-11-24 ENCOUNTER — Encounter: Payer: Self-pay | Admitting: Nurse Practitioner

## 2014-11-24 DIAGNOSIS — I10 Essential (primary) hypertension: Secondary | ICD-10-CM

## 2014-11-24 DIAGNOSIS — K219 Gastro-esophageal reflux disease without esophagitis: Secondary | ICD-10-CM

## 2014-11-24 DIAGNOSIS — B0229 Other postherpetic nervous system involvement: Secondary | ICD-10-CM

## 2014-11-24 DIAGNOSIS — S12600S Unspecified displaced fracture of seventh cervical vertebra, sequela: Secondary | ICD-10-CM

## 2014-11-24 DIAGNOSIS — E039 Hypothyroidism, unspecified: Secondary | ICD-10-CM

## 2014-11-24 NOTE — Assessment & Plan Note (Signed)
Takes Levothyroxine 75mcg daily. 03/23/14 TSH 2.553. 05/20/14 TSH 3.224   

## 2014-11-24 NOTE — Progress Notes (Signed)
Patient ID: Kim Hodges, female   DOB: 04-Jun-1909, 81 y.o.   MRN: [id number]   Code Status: DNR  No Known Allergies  Chief Complaint  Patient presents with  . Medical Management of Chronic Issues    HPI: Patient is a 81 y.o. female seen in the SNF at [facility] today for evaluation of chronic medical conditions.     Hospitalized 06/30/2014-07/06/2014 for s/p fall and resultants of left hip fx, T1 compression fracture, C7 lamina fracture. She is s/p IM nail L intertrochanteric femur 07/02/14 Orthopedics: Dr Lequita Halt 009/06/2014 -Lovenox to ASA 325mg -ASA 81mg  for post op DVT/PE prophylaxis. T1 compression fracture of C7 lamina fracture-Aspen neck collar, pneumonia-complete 6 days of Augmentin at SNF [facility]   Problem List Items Addressed This Visit    Post herpetic neuralgia (Chronic)    Continued on home pain regimen of fentanyl patch 19mcg/hr  and gabapentin 100mg  tid, and Tylenol 650mg  tid prn        Hypothyroidism (Chronic)    Takes Levothyroxine daily. 03/23/14 TSH 2.553. 05/20/14 TSH 3.224        GERD (gastroesophageal reflux disease)    Stable, continue Omeprazole 20mg  daily.        Essential hypertension - Primary    controlled on a low-dose Coreg 3.125mg  bid.         C7 cervical fracture    Stable.          Review of Systems: Review of Systems  Constitutional: Negative for fever, chills, weight loss, malaise/fatigue and diaphoresis.  HENT: Positive for hearing loss. Negative for congestion, ear discharge, ear pain, nosebleeds, sore throat and tinnitus.   Eyes: Negative for blurred vision, double vision, photophobia, pain, discharge and redness.  Respiratory: Negative for cough, hemoptysis, sputum production, shortness of breath, wheezing and stridor.   Cardiovascular: Positive for leg swelling. Negative for chest pain, palpitations, orthopnea, claudication and PND.       LLE  Gastrointestinal: Negative for heartburn, nausea, vomiting, abdominal  pain, diarrhea, constipation, blood in stool and melena.  Genitourinary: Positive for frequency. Negative for dysuria, urgency, hematuria and flank pain.  Musculoskeletal: Positive for joint pain. Negative for myalgias, back pain, falls and neck pain.       Left hip and upper leg aches and weakness when walking. Ambulates with walker 3x /day.   Skin: Negative for itching and rash.  Neurological: Negative for dizziness, tingling, tremors, sensory change, speech change, focal weakness, seizures, loss of consciousness, weakness and headaches.       Post herpetic neuralgia.   Endo/Heme/Allergies: Negative for environmental allergies and polydipsia. Does not bruise/bleed easily.  Psychiatric/Behavioral: Positive for memory loss. Negative for depression, suicidal ideas, hallucinations and substance abuse. The patient is not nervous/anxious and does not have insomnia.      Past Medical History  Diagnosis Date  . Chronic kidney disease, stage II (mild) 12/25/2012  . Unspecified arthropathy, lower leg 09/11/2011  . Swelling of limb 09/10/2012  . Other and unspecified hyperlipidemia 07/26/2011  . Actinic keratosis 07/26/2011  . Abnormality of gait 07/26/2011  . Hyperglycemia 07/26/2011  . Tear film insufficiency, unspecified 12/29/2009  . Other malaise and fatigue 12/29/2009  . Unspecified hearing loss 06/30/2009  . Unspecified hypothyroidism 12/15/2008  . Unspecified essential hypertension 12/15/2008  . Unspecified glaucoma 12/15/2008    both eyes  . Weak 08/02/13  . Hyponatremia 08/02/13  . Herpes zoster 08/02/13  . Post herpetic neuralgia 08/02/13  . Cerebral atrophy 08/02/13  . Cerebrovascular disease 08/02/13  .  Atherosclerosis 08/02/13  . Abnormal chest x-ray 08/02/13    08/02/13 nodule in left mid to upper lung  . Murmur, cardiac 08/02/13    aortic ejection  . Fracture lumbar vertebra-closed 08/02/13    L1  . Memory changes 07/03/13  . Lung mass 07/01/2014  . Intertrochanteric fracture of  left femur 06/30/14  . Fracture of T1 vertebra 06/30/14  . Fracture of C7 vertebra, closed 06/30/14  . History of fall 06/30/14  . GERD (gastroesophageal reflux disease) 06/03/2014  . Unspecified constipation 07/07/2014   Past Surgical History  Procedure Laterality Date  . Small intestine surgery  1957  . Cardiac catheterization  2004  . Gallbladder surgery  2006  . Cholecystectomy    . Femur im nail Left 07/02/2014    Procedure: INTRAMEDULLARY (IM) NAIL FEMORAL;  Surgeon: Loanne Drilling, MD;  Location: [facility];  Service: Orthopedics;  Laterality: Left;   Social History:   reports that she has never smoked. She has never used smokeless tobacco. She reports that she does not drink alcohol or use illicit drugs.  Family History  Problem Relation Age of Onset  . Cancer Sister   . Cancer Brother     pancreatic  . Cancer Son     lung  . Cancer Brother     pancreatic    Medications: Patient's Medications  New Prescriptions   No medications on file  Previous Medications   ACETAMINOPHEN (TYLENOL ARTHRITIS PAIN) 650 MG CR TABLET    Take 650 mg by mouth 3 (three) times daily as needed for pain. May also take one tablet every 6 hours as needed for pain   CALCIUM CARB-CHOLECALCIFEROL (CALCIUM 500 +D) 500-400 MG-UNIT TABS    Take 1 tablet by mouth daily.   CARVEDILOL (COREG) 3.125 MG TABLET    Take 1 tablet (3.125 mg total) by mouth 2 (two) times daily with a meal.   DOCUSATE SODIUM 100 MG CAPS    Take 100 mg by mouth 2 (two) times daily.   DORZOLAMIDE-TIMOLOL (COSOPT) 22.3-6.8 MG/ML OPHTHALMIC SOLUTION    Place 1 drop into both eyes 2 (two) times daily.    ENOXAPARIN (LOVENOX) 30 MG/0.3ML INJECTION    Inject 0.3 mLs (30 mg total) into the skin daily. Take injections for seven more days and then switch over to a 325 mg Aspirin daily for three more weeks.   FEEDING SUPPLEMENT, ENSURE COMPLETE, (ENSURE COMPLETE) LIQD    Take 237 mLs by mouth 2 (two) times daily between meals.   FENTANYL (DURAGESIC -  DOSED MCG/HR) 12 MCG/HR    Place 1 patch (12.5 mcg total) onto the skin every 3 (three) days. Remove old patch   FERROUS SULFATE 325 (65 FE) MG TABLET    Take 1 tablet (325 mg total) by mouth daily with breakfast.   GUAIFENESIN (MUCINEX) 600 MG 12 HR TABLET    Take 1 tablet (600 mg total) by mouth 2 (two) times daily. Take for 6 days then stop.   HYDROCODONE-ACETAMINOPHEN (NORCO/VICODIN) 5-325 MG PER TABLET       LEVOTHYROXINE (SYNTHROID, LEVOTHROID) 75 MCG TABLET    Take 75 mcg by mouth daily before breakfast.   OMEPRAZOLE (PRILOSEC) 20 MG CAPSULE    Take 20 mg by mouth daily.   SENNA (SENOKOT) 8.6 MG TABS TABLET    Take 1 tablet (8.6 mg total) by mouth 2 (two) times daily.   SPECIALTY VITAMINS PRODUCTS (ICAPS LUTEIN-ZEAXANTHIN PO)    Take 1 tablet by mouth daily.  TRAMADOL (ULTRAM) 50 MG TABLET    Take one tablet by mouth twice daily for pain  Modified Medications   No medications on file  Discontinued Medications   No medications on file     Physical Exam: Physical Exam  Constitutional: She is oriented to person, place, and time. She appears well-developed and well-nourished. No distress.  HENT:  Head: Normocephalic and atraumatic.  Right Ear: External ear normal.  Left Ear: External ear normal.  Nose: Nose normal.  Mouth/Throat: Oropharynx is clear and moist. No oropharyngeal exudate.  Eyes: Conjunctivae and EOM are normal. Pupils are equal, round, and reactive to light. Right eye exhibits no discharge. Left eye exhibits no discharge. No scleral icterus.  Neck: Normal range of motion. Neck supple. No JVD present. No tracheal deviation present. No thyromegaly present.  Cardiovascular: Normal rate and regular rhythm.   Murmur heard. 3/6  Pulmonary/Chest: Effort normal. No stridor. No respiratory distress. She has no wheezes. She has no rales. She exhibits no tenderness.  Abdominal: Soft. Bowel sounds are normal. She exhibits no distension. There is no tenderness. There is no rebound  and no guarding.  Genitourinary: Guaiac negative stool.  Musculoskeletal: Normal range of motion. She exhibits edema. She exhibits no tenderness.  Mild edema entire left leg has improved since the left hip surgery. Trace to 1+ LLE.  Pain is well managed with Fentanyl 812mcg/hr patch.    Lymphadenopathy:    She has no cervical adenopathy.  Neurological: She is alert and oriented to person, place, and time. She displays normal reflexes. No cranial nerve deficit. She exhibits normal muscle tone. Coordination normal.  Skin: Skin is warm and dry. No rash noted. She is not diaphoretic. No erythema. No pallor.  Let hip surgical scar.  Small keratotic lesions anterior LLE and calf witch is injured with serous fluid leakage. No erythema, warmth, odorous drainage, and tenderness.     Psychiatric: She has a normal mood and affect. Her behavior is normal. Judgment and thought content normal.    Filed Vitals:   11/24/14 1336  BP: 123/76  Pulse: 66  Temp: 98.4 F (36.9 C)  TempSrc: Tympanic  Resp: 16      Labs reviewed: Basic Metabolic Panel:  Recent Labs  40/98/1104/12/06 05/20/14  07/02/14 0433  07/04/14 0445 07/05/14 0445 07/06/14 0425 07/13/14 09/28/14  NA  --  135*  < >  --   < > 132* 135* 131* 136* 137  K  --  4.1  < >  --   < > 3.8 4.3 4.1 5.5* 4.2  CL  --   --   < >  --   < > 98 99 96  --   --   CO2  --   --   < >  --   < > 21 24 22   --   --   GLUCOSE  --   --   < >  --   < > 117* 121* 110*  --   --   BUN  --  25*  < >  --   < > 25* 35* 37* 38* 34*  CREATININE  --  1.0  < >  --   < > 0.90 1.15* 1.01 1.1 0.8  CALCIUM  --   --   < >  --   < > 8.0* 8.0* 8.0*  --   --   TSH 2.55 3.22  --  1.990  --   --   --   --   --   --   < > =  values in this interval not displayed. Liver Function Tests:  Recent Labs  07/01/14 6295 07/13/14 09/28/14  AST ALT ALKPHOS 63 58 72  BILITOT 0.5  --   --   PROT 6.5  --   --   ALBUMIN 3.2*  --   --    CBC:  Recent Labs   06/30/14 1807 07/01/14 0632  07/04/14 2130 07/05/14 0445 07/06/14 0425 07/13/14 09/28/14 10/28/14  WBC 8.4 9.7  < > 12.0* 12.7* 11.6* 9.4 9.1 7.3  NEUTROABS 5.8 6.9  --   --   --   --   --   --   --   HGB 10.6* 9.8*  < > 11.0* 10.5* 10.6* 10.6* 10.3* 10.5*  HCT 32.7* 29.3*  < > 33.3* 30.7* 31.3* 32* 31* 33*  MCV 91.1 89.6  < > 89.3 88.5 88.4  --   --   --   PLT 241 203  < > 189 205 250 402* 334 231  < > = values in this interval not displayed.  Past Procedures:  09/29/13 Echocardiogram: moderate AI, moderate to severe MR, moderate TR, moderate pulmonary hypertension, mild LLH, hyperdynamic LV systolic function, EF 75%.    05/24/14 CT chest w/o contrast:  IMPRESSION:  Enlarging masslike density in the area of prior linear scarring on  2009 chest CT. Findings are concerning for possible scar carcinoma.  This could be further evaluated with PET CT or tissue biopsy if felt  clinically indicated.  Trace left pleural effusion.  Mild cardiomegaly.  Small hiatal hernia.  Borderline low right paratracheal lymph node.  06/2014 hospital:   CT C-spine 06/30/2014  X-ray of the left hip 06/30/2014  X-ray of the right foot 06/30/2014  Chest x-ray 06/30/2001, 07/04/2014  X-ray of the right ankle 06/30/2014  2 d echo 07/01/14   Assessment/Plan Post herpetic neuralgia Continued on home pain regimen of fentanyl patch 93mcg/hr  and gabapentin  tid, and Tylenol  tid prn     Hypothyroidism Takes Levothyroxine daily. 03/23/14 TSH 2.553. 05/20/14 TSH 3.224     GERD (gastroesophageal reflux disease) Stable, continue Omeprazole  daily.     Essential hypertension controlled on a low-dose Coreg 3.125mg  bid.      C7 cervical fracture Stable.      Family/ Staff Communication: observe the patient.   Goals of Care: SNF  Labs/tests ordered: none

## 2014-11-24 NOTE — Assessment & Plan Note (Signed)
Stable

## 2014-11-24 NOTE — Assessment & Plan Note (Signed)
Stable, continue Omeprazole 20mg daily.  

## 2014-11-24 NOTE — Assessment & Plan Note (Signed)
Continued on home pain regimen of fentanyl patch 12mcg/hr  and gabapentin 100mg tid, and Tylenol 650mg tid prn   

## 2014-11-24 NOTE — Assessment & Plan Note (Signed)
controlled on a low-dose Coreg 3.125mg bid.    

## 2014-12-23 ENCOUNTER — Non-Acute Institutional Stay (SKILLED_NURSING_FACILITY): Payer: Medicare PPO | Admitting: Nurse Practitioner

## 2014-12-23 ENCOUNTER — Encounter: Payer: Self-pay | Admitting: Nurse Practitioner

## 2014-12-23 DIAGNOSIS — E039 Hypothyroidism, unspecified: Secondary | ICD-10-CM | POA: Diagnosis not present

## 2014-12-23 DIAGNOSIS — R609 Edema, unspecified: Secondary | ICD-10-CM | POA: Diagnosis not present

## 2014-12-23 DIAGNOSIS — I10 Essential (primary) hypertension: Secondary | ICD-10-CM

## 2014-12-23 DIAGNOSIS — K219 Gastro-esophageal reflux disease without esophagitis: Secondary | ICD-10-CM | POA: Diagnosis not present

## 2014-12-23 DIAGNOSIS — B0229 Other postherpetic nervous system involvement: Secondary | ICD-10-CM

## 2014-12-23 NOTE — Assessment & Plan Note (Signed)
Trace BLE 

## 2014-12-23 NOTE — Assessment & Plan Note (Signed)
controlled on a low-dose Coreg 3.125mg bid.    

## 2014-12-23 NOTE — Progress Notes (Signed)
Patient ID: Kim Hodges, female   DOB: [birth year], 81 y.o.   MRN: [id number]   Code Status: DNR  No Known Allergies  Chief Complaint  Patient presents with  . Medical Management of Chronic Issues    HPI: Patient is a 81 y.o. female seen in the SNF at [facility] today for evaluation of chronic medical conditions.     Hospitalized 06/30/2014-07/06/2014 for s/p fall and resultants of left hip fx, T1 compression fracture, C7 lamina fracture. She is s/p IM nail L intertrochanteric femur 07/02/14 Orthopedics: Dr Lequita Halt 009/06/2014 -Lovenox to ASA 325mg -ASA 81mg  for post op DVT/PE prophylaxis. T1 compression fracture of C7 lamina fracture-Aspen neck collar, pneumonia-complete 6 days of Augmentin at SNF [facility]   Problem List Items Addressed This Visit    Post herpetic neuralgia - Primary (Chronic)    Continued on home pain regimen of fentanyl patch 73mcg/hr  and gabapentin 100mg  tid, and Tylenol 650mg  tid prn        Hypothyroidism (Chronic)    Takes Levothyroxine daily. 03/23/14 TSH 2.553. 05/20/14 TSH 3.224        GERD (gastroesophageal reflux disease)    Stable, continue Omeprazole 20mg  daily.         Essential hypertension    controlled on a low-dose Coreg 3.125mg  bid.        Edema    Trace BLE          Review of Systems: Review of Systems  Constitutional: Negative for fever, chills, weight loss, malaise/fatigue and diaphoresis.  HENT: Positive for hearing loss. Negative for congestion, ear discharge, ear pain, nosebleeds, sore throat and tinnitus.   Eyes: Negative for blurred vision, double vision, photophobia, pain, discharge and redness.  Respiratory: Negative for cough, hemoptysis, sputum production, shortness of breath, wheezing and stridor.   Cardiovascular: Positive for leg swelling. Negative for chest pain, palpitations, orthopnea, claudication and PND.       Trace only  Gastrointestinal: Negative for heartburn, nausea, vomiting, abdominal pain,  diarrhea, constipation, blood in stool and melena.  Genitourinary: Positive for frequency. Negative for dysuria, urgency, hematuria and flank pain.  Musculoskeletal: Positive for joint pain. Negative for myalgias, back pain, falls and neck pain.  Skin: Negative for itching and rash.  Neurological: Negative for dizziness, tingling, tremors, sensory change, speech change, focal weakness, seizures, loss of consciousness, weakness and headaches.  Endo/Heme/Allergies: Negative for environmental allergies and polydipsia. Does not bruise/bleed easily.  Psychiatric/Behavioral: Positive for memory loss. Negative for depression, suicidal ideas, hallucinations and substance abuse. The patient is not nervous/anxious and does not have insomnia.      Past Medical History  Diagnosis Date  . Chronic kidney disease, stage II (mild) 12/25/2012  . Unspecified arthropathy, lower leg 09/11/2011  . Swelling of limb 09/10/2012  . Other and unspecified hyperlipidemia 07/26/2011  . Actinic keratosis 07/26/2011  . Abnormality of gait 07/26/2011  . Hyperglycemia 07/26/2011  . Tear film insufficiency, unspecified 12/29/2009  . Other malaise and fatigue 12/29/2009  . Unspecified hearing loss 06/30/2009  . Unspecified hypothyroidism 12/15/2008  . Unspecified essential hypertension 12/15/2008  . Unspecified glaucoma 12/15/2008    both eyes  . Weak 08/02/13  . Hyponatremia 08/02/13  . Herpes zoster 08/02/13  . Post herpetic neuralgia 08/02/13  . Cerebral atrophy 08/02/13  . Cerebrovascular disease 08/02/13  . Atherosclerosis 08/02/13  . Abnormal chest x-ray 08/02/13    08/02/13 nodule in left mid to upper lung  . Murmur, cardiac 08/02/13    aortic ejection  .  Fracture lumbar vertebra-closed 08/02/13    L1  . Memory changes 07/03/13  . Lung mass 07/01/2014  . Intertrochanteric fracture of left femur 06/30/14  . Fracture of T1 vertebra 06/30/14  . Fracture of C7 vertebra, closed 06/30/14  . History of fall 06/30/14  . GERD  (gastroesophageal reflux disease) 06/03/2014  . Unspecified constipation 07/07/2014   Past Surgical History  Procedure Laterality Date  . Small intestine surgery  1957  . Cardiac catheterization  2004  . Gallbladder surgery  2006  . Cholecystectomy    . Femur im nail Left 07/02/2014    Procedure: INTRAMEDULLARY (IM) NAIL FEMORAL;  Surgeon: Loanne Drilling, MD;  Location: [facility];  Service: Orthopedics;  Laterality: Left;   Social History:   reports that she has never smoked. She has never used smokeless tobacco. She reports that she does not drink alcohol or use illicit drugs.  Family History  Problem Relation Age of Onset  . Cancer Sister   . Cancer Brother     pancreatic  . Cancer Son     lung  . Cancer Brother     pancreatic    Medications: Patient's Medications  New Prescriptions   No medications on file  Previous Medications   ACETAMINOPHEN (TYLENOL ARTHRITIS PAIN) 650 MG CR TABLET    Take 650 mg by mouth 3 (three) times daily as needed for pain. May also take one tablet every 6 hours as needed for pain   CALCIUM CARB-CHOLECALCIFEROL (CALCIUM 500 +D) 500-400 MG-UNIT TABS    Take 1 tablet by mouth daily.   CARVEDILOL (COREG) 3.125 MG TABLET    Take 1 tablet (3.125 mg total) by mouth 2 (two) times daily with a meal.   DOCUSATE SODIUM 100 MG CAPS    Take 100 mg by mouth 2 (two) times daily.   DORZOLAMIDE-TIMOLOL (COSOPT) 22.3-6.8 MG/ML OPHTHALMIC SOLUTION    Place 1 drop into both eyes 2 (two) times daily.    ENOXAPARIN (LOVENOX) 30 MG/0.3ML INJECTION    Inject 0.3 mLs (30 mg total) into the skin daily. Take injections for seven more days and then switch over to a 325 mg Aspirin daily for three more weeks.   FEEDING SUPPLEMENT, ENSURE COMPLETE, (ENSURE COMPLETE) LIQD    Take 237 mLs by mouth 2 (two) times daily between meals.   FENTANYL (DURAGESIC - DOSED MCG/HR) 12 MCG/HR    Place 1 patch (12.5 mcg total) onto the skin every 3 (three) days. Remove old patch   FERROUS SULFATE 325  (65 FE) MG TABLET    Take 1 tablet (325 mg total) by mouth daily with breakfast.   GUAIFENESIN (MUCINEX) 600 MG 12 HR TABLET    Take 1 tablet (600 mg total) by mouth 2 (two) times daily. Take for 6 days then stop.   HYDROCODONE-ACETAMINOPHEN (NORCO/VICODIN) 5-325 MG PER TABLET       LEVOTHYROXINE (SYNTHROID, LEVOTHROID) 75 MCG TABLET    Take 75 mcg by mouth daily before breakfast.   OMEPRAZOLE (PRILOSEC) 20 MG CAPSULE    Take 20 mg by mouth daily.   SENNA (SENOKOT) 8.6 MG TABS TABLET    Take 1 tablet (8.6 mg total) by mouth 2 (two) times daily.   SPECIALTY VITAMINS PRODUCTS (ICAPS LUTEIN-ZEAXANTHIN PO)    Take 1 tablet by mouth daily.   TRAMADOL (ULTRAM) 50 MG TABLET    Take one tablet by mouth twice daily for pain  Modified Medications   No medications on file  Discontinued Medications  No medications on file     Physical Exam: Physical Exam  Constitutional: She is oriented to person, place, and time. She appears well-developed and well-nourished. No distress.  HENT:  Head: Normocephalic and atraumatic.  Right Ear: External ear normal.  Left Ear: External ear normal.  Nose: Nose normal.  Mouth/Throat: Oropharynx is clear and moist. No oropharyngeal exudate.  Eyes: Conjunctivae and EOM are normal. Pupils are equal, round, and reactive to light. Right eye exhibits no discharge. Left eye exhibits no discharge. No scleral icterus.  Neck: Normal range of motion. Neck supple. No JVD present. No tracheal deviation present. No thyromegaly present.  Cardiovascular: Normal rate and regular rhythm.   Murmur heard. 3/6  Pulmonary/Chest: Effort normal. No stridor. No respiratory distress. She has no wheezes. She has no rales. She exhibits no tenderness.  Abdominal: Soft. Bowel sounds are normal. She exhibits no distension. There is no tenderness. There is no rebound and no guarding.  Genitourinary: Guaiac negative stool.  Musculoskeletal: Normal range of motion. She exhibits edema. She exhibits  no tenderness.  Mild edema entire left leg has improved since the left hip surgery. Trace edema in ankles. Pain is well managed with Fentanyl 69mcg/hr patch.    Lymphadenopathy:    She has no cervical adenopathy.  Neurological: She is alert and oriented to person, place, and time. She displays normal reflexes. No cranial nerve deficit. She exhibits normal muscle tone. Coordination normal.  Skin: Skin is warm and dry. No rash noted. She is not diaphoretic. No erythema. No pallor.  Let hip surgical scar.  Small keratotic lesions anterior LLE and calf witch is injured with serous fluid leakage. No erythema, warmth, odorous drainage, and tenderness.     Psychiatric: She has a normal mood and affect. Her behavior is normal. Judgment and thought content normal.    Filed Vitals:   12/23/14 1535  BP: 138/64  Pulse: 68  Temp: 97.4 F (36.3 C)  TempSrc: Tympanic  Resp: 18      Labs reviewed: Basic Metabolic Panel:  Recent Labs  16/10/96 05/20/14  07/02/14 0433  07/04/14 0445 07/05/14 0445 07/06/14 0425 07/13/14 09/28/14  NA  --  135*  < >  --   < > 132* 135* 131* 136* 137  K  --  4.1  < >  --   < > 3.8 4.3 4.1 5.5* 4.2  CL  --   --   < >  --   < > 98 99 96  --   --   CO2  --   --   < >  --   < > --   --   GLUCOSE  --   --   < >  --   < > 117* 121* 110*  --   --   BUN  --  25*  < >  --   < > 25* 35* 37* 38* 34*  CREATININE  --  1.0  < >  --   < > 0.90 1.15* 1.01 1.1 0.8  CALCIUM  --   --   < >  --   < > 8.0* 8.0* 8.0*  --   --   TSH 2.55 3.22  --  1.990  --   --   --   --   --   --   < > = values in this interval not displayed. Liver Function Tests:  Recent Labs  07/01/14 0454 07/13/14 09/28/14  AST  17 15 14   ALT 7 9 8   ALKPHOS 63 58 72  BILITOT 0.5  --   --   PROT 6.5  --   --   ALBUMIN 3.2*  --   --    CBC:  Recent Labs  06/30/14 1807 07/01/14 0632  07/04/14 2130 07/05/14 0445 07/06/14 0425 07/13/14 09/28/14 10/28/14  WBC 8.4 9.7  < > 12.0* 12.7*  11.6* 9.4 9.1 7.3  NEUTROABS 5.8 6.9  --   --   --   --   --   --   --   HGB 10.6* 9.8*  < > 11.0* 10.5* 10.6* 10.6* 10.3* 10.5*  HCT 32.7* 29.3*  < > 33.3* 30.7* 31.3* 32* 31* 33*  MCV 91.1 89.6  < > 89.3 88.5 88.4  --   --   --   PLT 241 203  < > 189 205 250 402* 334 231  < > = values in this interval not displayed.  Past Procedures:  09/29/13 Echocardiogram: moderate AI, moderate to severe MR, moderate TR, moderate pulmonary hypertension, mild LLH, hyperdynamic LV systolic function, EF 75%.    05/24/14 CT chest w/o contrast:  IMPRESSION:  Enlarging masslike density in the area of prior linear scarring on  2009 chest CT. Findings are concerning for possible scar carcinoma.  This could be further evaluated with PET CT or tissue biopsy if felt  clinically indicated.  Trace left pleural effusion.  Mild cardiomegaly.  Small hiatal hernia.  Borderline low right paratracheal lymph node.  06/2014 hospital:   CT C-spine 06/30/2014  X-ray of the left hip 06/30/2014  X-ray of the right foot 06/30/2014  Chest x-ray 06/30/2001, 07/04/2014  X-ray of the right ankle 06/30/2014  2 d echo 07/01/14   Assessment/Plan Post herpetic neuralgia Continued on home pain regimen of fentanyl patch 912mcg/hr  and gabapentin 100mg  tid, and Tylenol 650mg  tid prn     Hypothyroidism Takes Levothyroxine 75mcg daily. 03/23/14 TSH 2.553. 05/20/14 TSH 3.224     GERD (gastroesophageal reflux disease) Stable, continue Omeprazole 20mg  daily.      Essential hypertension controlled on a low-dose Coreg 3.125mg  bid.     Edema Trace BLE      Family/ Staff Communication: observe the patient.   Goals of Care: SNF  Labs/tests ordered: none

## 2014-12-23 NOTE — Assessment & Plan Note (Signed)
Stable, continue Omeprazole 20mg daily.  

## 2014-12-23 NOTE — Assessment & Plan Note (Signed)
Takes Levothyroxine 75mcg daily. 03/23/14 TSH 2.553. 05/20/14 TSH 3.224   

## 2014-12-23 NOTE — Assessment & Plan Note (Signed)
Continued on home pain regimen of fentanyl patch 12mcg/hr  and gabapentin 100mg tid, and Tylenol 650mg tid prn   

## 2015-01-24 ENCOUNTER — Encounter: Payer: Self-pay | Admitting: Nurse Practitioner

## 2015-01-24 ENCOUNTER — Non-Acute Institutional Stay (SKILLED_NURSING_FACILITY): Payer: Medicare PPO | Admitting: Nurse Practitioner

## 2015-01-24 DIAGNOSIS — R609 Edema, unspecified: Secondary | ICD-10-CM

## 2015-01-24 DIAGNOSIS — I10 Essential (primary) hypertension: Secondary | ICD-10-CM | POA: Diagnosis not present

## 2015-01-24 DIAGNOSIS — K5900 Constipation, unspecified: Secondary | ICD-10-CM | POA: Diagnosis not present

## 2015-01-24 DIAGNOSIS — B0229 Other postherpetic nervous system involvement: Secondary | ICD-10-CM

## 2015-01-24 DIAGNOSIS — K219 Gastro-esophageal reflux disease without esophagitis: Secondary | ICD-10-CM | POA: Diagnosis not present

## 2015-01-24 DIAGNOSIS — E039 Hypothyroidism, unspecified: Secondary | ICD-10-CM | POA: Diagnosis not present

## 2015-01-24 NOTE — Progress Notes (Signed)
Patient ID: Kim Hodges, female   DOB: 12-16-1908, 81 y.o.   MRN: [id number]   Code Status: DNR  No Known Allergies  Chief Complaint  Patient presents with  . Medical Management of Chronic Issues    HPI: Patient is a 81 y.o. female seen in the SNF at [facility] today for evaluation of chronic medical conditions.  Problem List Items Addressed This Visit    Hypothyroidism - Primary (Chronic)    Takes Levothyroxine daily. 03/23/14 TSH 2.553. 05/20/14 TSH 3.224       Post herpetic neuralgia (Chronic)    Continued on home pain regimen of fentanyl patch 82mcg/hr  and gabapentin 100mg  tid, and Tylenol 650mg  tid prn        Essential hypertension    controlled on a low-dose Coreg 3.125mg  bid.        Edema    Trace BLE      GERD (gastroesophageal reflux disease)    Stable, continue Omeprazole 20mg  daily.         Constipation    08/25/14 dc'd Senokot continued Colace.          Review of Systems:  Review of Systems  Constitutional: Negative for fever, chills and diaphoresis.  HENT: Positive for hearing loss. Negative for congestion, ear discharge, ear pain, nosebleeds, sore throat and tinnitus.   Eyes: Negative for photophobia, pain, discharge and redness.  Respiratory: Negative for cough, shortness of breath, wheezing and stridor.   Cardiovascular: Positive for leg swelling. Negative for chest pain and palpitations.       Trace only  Gastrointestinal: Negative for nausea, vomiting, abdominal pain, diarrhea, constipation and blood in stool.  Endocrine: Negative for polydipsia.  Genitourinary: Positive for frequency. Negative for dysuria, urgency, hematuria and flank pain.  Musculoskeletal: Negative for myalgias, back pain and neck pain.  Skin: Negative for rash.  Allergic/Immunologic: Negative for environmental allergies.  Neurological: Negative for dizziness, tremors, seizures, weakness and headaches.  Hematological: Does not bruise/bleed easily.    Psychiatric/Behavioral: Negative for suicidal ideas and hallucinations. The patient is not nervous/anxious.      Past Medical History  Diagnosis Date  . Chronic kidney disease, stage II (mild) 12/25/2012  . Unspecified arthropathy, lower leg 09/11/2011  . Swelling of limb 09/10/2012  . Other and unspecified hyperlipidemia 07/26/2011  . Actinic keratosis 07/26/2011  . Abnormality of gait 07/26/2011  . Hyperglycemia 07/26/2011  . Tear film insufficiency, unspecified 12/29/2009  . Other malaise and fatigue 12/29/2009  . Unspecified hearing loss 06/30/2009  . Unspecified hypothyroidism 12/15/2008  . Unspecified essential hypertension 12/15/2008  . Unspecified glaucoma 12/15/2008    both eyes  . Weak 08/02/13  . Hyponatremia 08/02/13  . Herpes zoster 08/02/13  . Post herpetic neuralgia 08/02/13  . Cerebral atrophy 08/02/13  . Cerebrovascular disease 08/02/13  . Atherosclerosis 08/02/13  . Abnormal chest x-ray 08/02/13    08/02/13 nodule in left mid to upper lung  . Murmur, cardiac 08/02/13    aortic ejection  . Fracture lumbar vertebra-closed 08/02/13    L1  . Memory changes 07/03/13  . Lung mass 07/01/2014  . Intertrochanteric fracture of left femur 06/30/14  . Fracture of T1 vertebra 06/30/14  . Fracture of C7 vertebra, closed 06/30/14  . History of fall 06/30/14  . GERD (gastroesophageal reflux disease) 06/03/2014  . Unspecified constipation 07/07/2014   Past Surgical History  Procedure Laterality Date  . Small intestine surgery  1957  . Cardiac catheterization  2004  . Gallbladder surgery  2006  . Cholecystectomy    . Femur im nail Left 07/02/2014    Procedure: INTRAMEDULLARY (IM) NAIL FEMORAL;  Surgeon: Loanne DrillingFrank Aluisio V, MD;  Location: [facility];  Service: Orthopedics;  Laterality: Left;   Social History:   reports that she has never smoked. She has never used smokeless tobacco. She reports that she does not drink alcohol or use illicit drugs.  Family History  Problem Relation Age of Onset   . Cancer Sister   . Cancer Brother     pancreatic  . Cancer Son     lung  . Cancer Brother     pancreatic    Medications: Patient's Medications  New Prescriptions   No medications on file  Previous Medications   ACETAMINOPHEN (TYLENOL ARTHRITIS PAIN) 650 MG CR TABLET    Take 650 mg by mouth 3 (three) times daily as needed for pain. May also take one tablet every 6 hours as needed for pain   CALCIUM CARB-CHOLECALCIFEROL (CALCIUM 500 +D) 500-400 MG-UNIT TABS    Take 1 tablet by mouth daily.   CARVEDILOL (COREG) 3.125 MG TABLET    Take 1 tablet (3.125 mg total) by mouth 2 (two) times daily with a meal.   DOCUSATE SODIUM 100 MG CAPS    Take 100 mg by mouth 2 (two) times daily.   DORZOLAMIDE-TIMOLOL (COSOPT) 22.3-6.8 MG/ML OPHTHALMIC SOLUTION    Place 1 drop into both eyes 2 (two) times daily.    ENOXAPARIN (LOVENOX) 30 MG/0.3ML INJECTION    Inject 0.3 mLs (30 mg total) into the skin daily. Take injections for seven more days and then switch over to a 325 mg Aspirin daily for three more weeks.   FEEDING SUPPLEMENT, ENSURE COMPLETE, (ENSURE COMPLETE) LIQD    Take 237 mLs by mouth 2 (two) times daily between meals.   FENTANYL (DURAGESIC - DOSED MCG/HR) 12 MCG/HR    Place 1 patch (12.5 mcg total) onto the skin every 3 (three) days. Remove old patch   FERROUS SULFATE 325 (65 FE) MG TABLET    Take 1 tablet (325 mg total) by mouth daily with breakfast.   GUAIFENESIN (MUCINEX) 600 MG 12 HR TABLET    Take 1 tablet (600 mg total) by mouth 2 (two) times daily. Take for 6 days then stop.   HYDROCODONE-ACETAMINOPHEN (NORCO/VICODIN) 5-325 MG PER TABLET       LEVOTHYROXINE (SYNTHROID, LEVOTHROID) 75 MCG TABLET    Take 75 mcg by mouth daily before breakfast.   OMEPRAZOLE (PRILOSEC) 20 MG CAPSULE    Take 20 mg by mouth daily.   SENNA (SENOKOT) 8.6 MG TABS TABLET    Take 1 tablet (8.6 mg total) by mouth 2 (two) times daily.   SPECIALTY VITAMINS PRODUCTS (ICAPS LUTEIN-ZEAXANTHIN PO)    Take 1 tablet by mouth  daily.   TRAMADOL (ULTRAM) 50 MG TABLET    Take one tablet by mouth twice daily for pain  Modified Medications   No medications on file  Discontinued Medications   No medications on file     Physical Exam: Physical Exam  Constitutional: She is oriented to person, place, and time. She appears well-developed and well-nourished. No distress.  HENT:  Head: Normocephalic and atraumatic.  Right Ear: External ear normal.  Left Ear: External ear normal.  Nose: Nose normal.  Mouth/Throat: Oropharynx is clear and moist. No oropharyngeal exudate.  Eyes: Conjunctivae and EOM are normal. Pupils are equal, round, and reactive to light. Right eye exhibits no discharge. Left eye exhibits  no discharge. No scleral icterus.  Neck: Normal range of motion. Neck supple. No JVD present. No tracheal deviation present. No thyromegaly present.  Cardiovascular: Normal rate and regular rhythm.   Murmur heard. 3/6  Pulmonary/Chest: Effort normal. No stridor. No respiratory distress. She has no wheezes. She has no rales. She exhibits no tenderness.  Abdominal: Soft. Bowel sounds are normal. She exhibits no distension. There is no tenderness. There is no rebound and no guarding.  Genitourinary: Guaiac negative stool.  Musculoskeletal: Normal range of motion. She exhibits edema. She exhibits no tenderness.  Mild edema entire left leg has improved since the left hip surgery. Trace edema in ankles. Pain is well managed with Fentanyl 64mcg/hr patch.    Lymphadenopathy:    She has no cervical adenopathy.  Neurological: She is alert and oriented to person, place, and time. She displays normal reflexes. No cranial nerve deficit. She exhibits normal muscle tone. Coordination normal.  Skin: Skin is warm and dry. No rash noted. She is not diaphoretic. No erythema. No pallor.  Let hip surgical scar.  Small keratotic lesions anterior LLE and calf witch is injured with serous fluid leakage. No erythema, warmth, odorous  drainage, and tenderness.     Psychiatric: She has a normal mood and affect. Her behavior is normal. Judgment and thought content normal.    Filed Vitals:   01/24/15 1640  BP: 136/58  Pulse: 64  Temp: 97.4 F (36.3 C)  TempSrc: Tympanic  Resp: 16      Labs reviewed: Basic Metabolic Panel:  Recent Labs  16/10/96 05/20/14  07/02/14 0433  07/04/14 0445 07/05/14 0445 07/06/14 0425 07/13/14 09/28/14  NA  --  135*  < >  --   < > 132* 135* 131* 136* 137  K  --  4.1  < >  --   < > 3.8 4.3 4.1 5.5* 4.2  CL  --   --   < >  --   < > 98 99 96  --   --   CO2  --   --   < >  --   < > --   --   GLUCOSE  --   --   < >  --   < > 117* 121* 110*  --   --   BUN  --  25*  < >  --   < > 25* 35* 37* 38* 34*  CREATININE  --  1.0  < >  --   < > 0.90 1.15* 1.01 1.1 0.8  CALCIUM  --   --   < >  --   < > 8.0* 8.0* 8.0*  --   --   TSH 2.55 3.22  --  1.990  --   --   --   --   --   --   < > = values in this interval not displayed. Liver Function Tests:  Recent Labs  07/01/14 0454 07/13/14 09/28/14  AST ALT ALKPHOS 63 58 72  BILITOT 0.5  --   --   PROT 6.5  --   --   ALBUMIN 3.2*  --   --    No results for input(s): LIPASE, AMYLASE in the last 8760 hours. No results for input(s): AMMONIA in the last 8760 hours. CBC:  Recent Labs  06/30/14 1807 07/01/14 0632  07/04/14 2130 07/05/14 0445 07/06/14 0425 07/13/14 09/28/14 10/28/14  WBC 8.4  9.7  < > 12.0* 12.7* 11.6* 9.4 9.1 7.3  NEUTROABS 5.8 6.9  --   --   --   --   --   --   --   HGB 10.6* 9.8*  < > 11.0* 10.5* 10.6* 10.6* 10.3* 10.5*  HCT 32.7* 29.3*  < > 33.3* 30.7* 31.3* 32* 31* 33*  MCV 91.1 89.6  < > 89.3 88.5 88.4  --   --   --   PLT 241 203  < > 189 205 250 402* 334 231  < > = values in this interval not displayed. Lipid Panel: No results for input(s): CHOL, HDL, LDLCALC, TRIG, CHOLHDL, LDLDIRECT in the last 8760 hours.  Past Procedures:  09/29/13 Echocardiogram: moderate AI, moderate to severe  MR, moderate TR, moderate pulmonary hypertension, mild LLH, hyperdynamic LV systolic function, EF 75%.    05/24/14 CT chest w/o contrast:  IMPRESSION:  Enlarging masslike density in the area of prior linear scarring on  2009 chest CT. Findings are concerning for possible scar carcinoma.  This could be further evaluated with PET CT or tissue biopsy if felt  clinically indicated.  Trace left pleural effusion.  Mild cardiomegaly.  Small hiatal hernia.  Borderline low right paratracheal lymph node.  06/2014 hospital:    CT C-spine 06/30/2014   X-ray of the left hip 06/30/2014   X-ray of the right foot 06/30/2014   Chest x-ray 06/30/2001, 07/04/2014   X-ray of the right ankle 06/30/2014   2 d echo 07/01/14  Assessment/Plan Hypothyroidism Takes Levothyroxine daily. 03/23/14 TSH 2.553. 05/20/14 TSH 3.224    Post herpetic neuralgia Continued on home pain regimen of fentanyl patch 40mcg/hr  and gabapentin  tid, and Tylenol  tid prn     Essential hypertension controlled on a low-dose Coreg 3.125mg  bid.     Edema Trace BLE   GERD (gastroesophageal reflux disease) Stable, continue Omeprazole  daily.      Constipation 08/25/14 dc'd Senokot continued Colace.      Family/ Staff Communication: observe the patient  Goals of Care: SNF  Labs/tests ordered: none

## 2015-01-24 NOTE — Assessment & Plan Note (Signed)
08/25/14 dc'd Senokot continued Colace.   

## 2015-01-24 NOTE — Assessment & Plan Note (Signed)
controlled on a low-dose Coreg 3.125mg bid.    

## 2015-01-24 NOTE — Assessment & Plan Note (Signed)
Trace BLE 

## 2015-01-24 NOTE — Assessment & Plan Note (Signed)
Stable, continue Omeprazole 20mg daily.  

## 2015-01-24 NOTE — Assessment & Plan Note (Signed)
Continued on home pain regimen of fentanyl patch 12mcg/hr  and gabapentin 100mg tid, and Tylenol 650mg tid prn   

## 2015-01-24 NOTE — Assessment & Plan Note (Signed)
Takes Levothyroxine 75mcg daily. 03/23/14 TSH 2.553. 05/20/14 TSH 3.224   

## 2015-02-07 ENCOUNTER — Other Ambulatory Visit: Payer: Self-pay | Admitting: Nurse Practitioner

## 2015-02-07 DIAGNOSIS — S12600S Unspecified displaced fracture of seventh cervical vertebra, sequela: Secondary | ICD-10-CM

## 2015-02-07 DIAGNOSIS — H409 Unspecified glaucoma: Secondary | ICD-10-CM

## 2015-02-24 ENCOUNTER — Encounter: Payer: Self-pay | Admitting: Internal Medicine

## 2015-02-24 ENCOUNTER — Non-Acute Institutional Stay: Payer: Medicare PPO | Admitting: Internal Medicine

## 2015-02-24 ENCOUNTER — Encounter: Payer: Medicare PPO | Admitting: Nurse Practitioner

## 2015-02-24 DIAGNOSIS — R911 Solitary pulmonary nodule: Secondary | ICD-10-CM | POA: Diagnosis not present

## 2015-02-24 DIAGNOSIS — D638 Anemia in other chronic diseases classified elsewhere: Secondary | ICD-10-CM | POA: Diagnosis not present

## 2015-02-24 DIAGNOSIS — S72002S Fracture of unspecified part of neck of left femur, sequela: Secondary | ICD-10-CM

## 2015-02-24 DIAGNOSIS — N182 Chronic kidney disease, stage 2 (mild): Secondary | ICD-10-CM

## 2015-02-24 DIAGNOSIS — I10 Essential (primary) hypertension: Secondary | ICD-10-CM | POA: Diagnosis not present

## 2015-02-24 DIAGNOSIS — E039 Hypothyroidism, unspecified: Secondary | ICD-10-CM | POA: Diagnosis not present

## 2015-02-24 DIAGNOSIS — R413 Other amnesia: Secondary | ICD-10-CM | POA: Diagnosis not present

## 2015-02-24 DIAGNOSIS — B0229 Other postherpetic nervous system involvement: Secondary | ICD-10-CM | POA: Diagnosis not present

## 2015-02-24 DIAGNOSIS — R269 Unspecified abnormalities of gait and mobility: Secondary | ICD-10-CM

## 2015-02-24 NOTE — Progress Notes (Signed)
Patient ID: Kim Hodges, female   DOB: 04-13-1909, 81 y.o.   MRN: [id number]    HISTORY AND PHYSICAL  Location:  [facility]  Nursing Home Room Number: 17 Place of Service: [phone])   Extended Emergency Contact Information Primary Emergency Contact: Chivers,Gerald Address: [address]          [place] [address] [place] Home Phone: [phone] Mobile Phone: [phone] Relation: Son Secondary Emergency Contact: Wayna Chalet [place] Mobile Phone: [phone] Relation: None  Advanced Directive information Does patient have an advance directive?: Yes, Type of Advance Directive: Healthcare Power of [place];Living will;Out of facility DNR (pink MOST or yellow form), Pre-existing out of facility DNR order (yellow form or pink MOST form): Yellow form placed in chart (order not valid for inpatient use), Does patient want to make changes to advanced directive?: No - Patient declined  No chief complaint on file.   HPI:  Anemia of chronic disease: Last hemoglobin 10.5 with an MCV of 86.7 on 10/28/2014. Serum iron low normal at 43 (normal 42-145).  Chronic kidney disease, stage II (mild): Last checked 09/28/2014 with BUN 34 and creatinine 0.85  Hypothyroidism, unspecified hypothyroidism type: No recent values  Nodule of left lung: Follow-up chest x-ray later this year may be warranted. She denies cough or chest discomfort. Previous scapular discomfort has improved.  Post herpetic neuralgia: Improved. No complaints today  Abnormality of gait: Patient can walk unassisted with her walker. She prefers the 2 front wheels and rear skids due to stability. She also has a four-wheel walker but has quit using it.  Closed left hip fracture, sequela: Mild left hip discomfort as a residual problem.  Essential hypertension: Controlled  Memory loss: Mild to moderate    Past Medical History  Diagnosis Date  . Chronic kidney disease,  stage II (mild) 12/25/2012  . Unspecified arthropathy, lower leg 09/11/2011  . Swelling of limb 09/10/2012  . Other and unspecified hyperlipidemia 07/26/2011  . Actinic keratosis 07/26/2011  . Abnormality of gait 07/26/2011  . Hyperglycemia 07/26/2011  . Tear film insufficiency, unspecified 12/29/2009  . Other malaise and fatigue 12/29/2009  . Unspecified hearing loss 06/30/2009  . Unspecified hypothyroidism 12/15/2008  . Unspecified essential hypertension 12/15/2008  . Unspecified glaucoma 12/15/2008    both eyes  . Weak 08/02/13  . Hyponatremia 08/02/13  . Herpes zoster 08/02/13  . Post herpetic neuralgia 08/02/13  . Cerebral atrophy 08/02/13  . Cerebrovascular disease 08/02/13  . Atherosclerosis 08/02/13  . Abnormal chest x-ray 08/02/13    08/02/13 nodule in left mid to upper lung  . Murmur, cardiac 08/02/13    aortic ejection  . Fracture lumbar vertebra-closed 08/02/13    L1  . Memory changes 07/03/13  . Lung mass 07/01/2014  . Intertrochanteric fracture of left femur 06/30/14  . Fracture of T1 vertebra 06/30/14  . Fracture of C7 vertebra, closed 06/30/14  . History of fall 06/30/14  . GERD (gastroesophageal reflux disease) 06/03/2014  . Unspecified constipation 07/07/2014  . Anemia of chronic disease 09/24/2013    05/20/14 Hgb 11.9 07/13/14 Hgb 10.6 09/28/14 Hgb 10.3-dc Fe, repeat CBC and Fe in one month.  10/28/13 Hgb 10.5, Iron 43   . Hypothyroidism 12/15/2008    03/23/14 TSH 2.553 05/20/14 TSH 3.224       . Memory loss 07/09/2013    07/09/13 MMSE 21/30, failed clock drawing     Past Surgical History  Procedure Laterality Date  . Small intestine  surgery  1957  . Cardiac catheterization  2004  . Gallbladder surgery  2006  . Cholecystectomy    . Femur im nail Left 07/02/2014    Procedure: INTRAMEDULLARY (IM) NAIL FEMORAL;  Surgeon: Loanne Drilling, MD;  Location: [facility];  Service: Orthopedics;  Laterality: Left;    Patient Care Team: [name], MD as PCP - General (Internal  Medicine) [name], MD (Internal Medicine) [facility] Elise Benne, MD as Consulting Physician (Ophthalmology) Man Mast X, NP as Nurse Practitioner (Nurse Practitioner) [facility] Elise Benne, MD as Consulting Physician (Ophthalmology) Man Mast X, NP as Nurse Practitioner (Nurse Practitioner)  History   Social History  . Marital Status: Widowed    Spouse Name: N/A  . Number of Children: N/A  . Years of Education: N/A   Occupational History  . retired Runner, broadcasting/film[employer]video    Social History Main Topics  . Smoking status: Never Smoker   . Smokeless tobacco: Never Used  . Alcohol Use: No  . Drug Use: No  . Sexual Activity: No   Other Topics Concern  . Not on file   Social History Narrative   Patient lives at [facility]   Walks with walker   DNR, POA, Living Will   Never smoked   Alcohol none   Exercise none              reports that she has never smoked. She has never used smokeless tobacco. She reports that she does not drink alcohol or use illicit drugs.  Family History  Problem Relation Age of Onset  . Cancer Sister   . Cancer Brother     pancreatic  . Cancer Son     lung  . Cancer Brother     pancreatic   Family Status  Relation Status Death Age  . Mother Deceased 70    natral causes  . Father Deceased 13    natural causes  . Sister Deceased 48  . Brother Deceased 28  . Son Deceased 76  . Brother Deceased 55  . Son Alive     Immunization History  Administered Date(s) Administered  . Influenza Whole 07/22/2012, 08/05/2013  . Influenza-Unspecified 08/25/2014  . Pneumococcal Polysaccharide-23 10/22/2006  . Td 10/22/2004    No Known Allergies  Medications: Patient's Medications  New Prescriptions   No medications on file  Previous Medications   ACETAMINOPHEN (TYLENOL ARTHRITIS PAIN) 650 MG CR TABLET    Take 650 mg by mouth 3 (three) times daily as needed for pain. May also take one tablet every 6 hours as  needed for pain   CARVEDILOL (COREG) 3.125 MG TABLET    Take 1 tablet (3.125 mg total) by mouth 2 (two) times daily with a meal.   DOCUSATE SODIUM 100 MG CAPS    Take 100 mg by mouth 2 (two) times daily.   DORZOLAMIDE-TIMOLOL (COSOPT) 22.3-6.8 MG/ML OPHTHALMIC SOLUTION    Place 1 drop into both eyes 2 (two) times daily.    FEEDING SUPPLEMENT, ENSURE COMPLETE, (ENSURE COMPLETE) LIQD    Take 237 mLs by mouth 2 (two) times daily between meals.   FENTANYL (DURAGESIC - DOSED MCG/HR) 12 MCG/HR    Place 1 patch (12.5 mcg total) onto the skin every 3 (three) days. Remove old patch   FERROUS SULFATE 325 (65 FE) MG TABLET    Take 1 tablet (325 mg total) by mouth daily with breakfast.   GUAIFENESIN (MUCINEX) 600 MG 12 HR  TABLET    Take 1 tablet (600 mg total) by mouth 2 (two) times daily. Take for 6 days then stop.   HYDROCODONE-ACETAMINOPHEN (NORCO/VICODIN) 5-325 MG PER TABLET       LEVOTHYROXINE (SYNTHROID, LEVOTHROID) 75 MCG TABLET    Take 75 mcg by mouth daily before breakfast.   OMEPRAZOLE (PRILOSEC) 20 MG CAPSULE    Take 20 mg by mouth daily.   SENNA (SENOKOT) 8.6 MG TABS TABLET    Take 1 tablet (8.6 mg total) by mouth 2 (two) times daily.   TRAMADOL (ULTRAM) 50 MG TABLET    Take one tablet by mouth twice daily for pain  Modified Medications   No medications on file  Discontinued Medications   No medications on file    Review of Systems  Constitutional: Negative for fever, chills and diaphoresis.  HENT: Positive for hearing loss. Negative for congestion, ear discharge, ear pain, nosebleeds, sore throat and tinnitus.   Eyes: Negative for photophobia, pain, discharge and redness.  Respiratory: Negative for cough, shortness of breath, wheezing and stridor.   Cardiovascular: Positive for leg swelling. Negative for chest pain and palpitations.       Trace only  Gastrointestinal: Negative for nausea, vomiting, abdominal pain, diarrhea, constipation and blood in stool.  Endocrine: Negative for  polydipsia.  Genitourinary: Positive for frequency. Negative for dysuria, urgency, hematuria and flank pain.  Musculoskeletal: Positive for gait problem. Negative for myalgias, back pain and neck pain.       Tender left hip.  Skin: Negative for rash.  Allergic/Immunologic: Negative for environmental allergies.  Neurological: Negative for dizziness, tremors, seizures, weakness and headaches.       Memory loss  Hematological: Does not bruise/bleed easily.       History of anemia  Psychiatric/Behavioral: Negative for suicidal ideas and hallucinations. The patient is not nervous/anxious.     Filed Vitals:   02/24/15 1500  BP: 132/80  Pulse: 66  Temp: 97.4 F (36.3 C)  Resp: 20  Height:  (1.549 m)  Weight: 113 lb 14.4 oz (51.665 kg)   Body mass index is 21.53 kg/(m^2).  Physical Exam  Constitutional: She is oriented to person, place, and time. She appears well-developed and well-nourished. No distress.  HENT:  Head: Normocephalic and atraumatic.  Right Ear: External ear normal.  Left Ear: External ear normal.  Nose: Nose normal.  Mouth/Throat: Oropharynx is clear and moist. No oropharyngeal exudate.  Eyes: Conjunctivae and EOM are normal. Pupils are equal, round, and reactive to light. Right eye exhibits no discharge. Left eye exhibits no discharge. No scleral icterus.  Neck: Normal range of motion. Neck supple. No JVD present. No tracheal deviation present. No thyromegaly present.  Cardiovascular: Normal rate and regular rhythm.   Murmur heard. 3/6  Pulmonary/Chest: Effort normal. No stridor. No respiratory distress. She has no wheezes. She has no rales. She exhibits no tenderness.  Abdominal: Soft. Bowel sounds are normal. She exhibits no distension. There is no tenderness. There is no rebound and no guarding.  Genitourinary: Guaiac negative stool.  Musculoskeletal: Normal range of motion. She exhibits edema. She exhibits no tenderness.  Mild edema entire left leg has  improved since the left hip surgery. Trace edema in ankles. Pain is well managed with Fentanyl 90mcg/hr patch.  Lymphadenopathy:    She has no cervical adenopathy.  Neurological: She is alert and oriented to person, place, and time. She displays normal reflexes. No cranial nerve deficit. She exhibits normal muscle tone. Coordination normal.  Skin:  Skin is warm and dry. No rash noted. She is not diaphoretic. No erythema. No pallor.  Let hip surgical scar.  Small keratotic lesions anterior LLE and calf . No erythema, warmth, odorous drainage, and tenderness.    Psychiatric: She has a normal mood and affect. Her behavior is normal. Judgment and thought content normal.     Labs reviewed: No visits with results within 3 Month(s) from this visit. Latest known visit with results is:  Lab on 11/01/2014  Component Date Value Ref Range Status  . Hemoglobin 10/28/2014 10.5* 12.0 - 16.0 g/dL Final  . HCT 16/10/960401/04/2015 33* 36 - 46 % Final  . Platelets 10/28/2014 231  150 - 399 K/L Final  . WBC 10/28/2014 7.3   Final    Dg Chest 1 View  06/30/2014   CLINICAL DATA:  Pain post trauma  EXAM: CHEST - 1 VIEW  COMPARISON:  Chest CT May 24, 2014  FINDINGS: Mass in the left upper lobe is again noted measuring approximately 2.3 x 1.8 cm. Elsewhere, lungs are clear. Heart is upper normal in size with pulmonary vascularity within normal limits. No adenopathy. No pneumothorax. No fractures apparent.  IMPRESSION: Right upper lobe mass again noted, suspicious for neoplasm. Elsewhere lungs clear. No pneumothorax apparent.   Electronically Signed   By: Bretta BangWilliam  Woodruff M.D.   On: 06/30/2014 19:02   Dg Ankle Complete Right  06/30/2014   CLINICAL DATA:  Bruising post trauma  EXAM: RIGHT ANKLE - COMPLETE 3+ VIEW  COMPARISON:  None.  FINDINGS: Frontal, oblique, and lateral views were obtained. There is a small avulsion arising from the lateral malleolus. There is a second small avulsion arising from the dorsal distal  talus. There is no appreciable joint effusion. There is mild generalized soft tissue swelling. The ankle mortise appears intact. There is a spur arising from the inferior calcaneus.  IMPRESSION: Small lateral malleolar avulsion. There is an avulsion also arising from the dorsal distal talus. Ankle mortise appears intact.   Electronically Signed   By: Bretta BangWilliam  Woodruff M.D.   On: 06/30/2014 19:04   Ct Cervical Spine Wo Contrast  06/30/2014   CLINICAL DATA:  Larey SeatFell.  Neck pain.  EXAM: CT CERVICAL SPINE WITHOUT CONTRAST  TECHNIQUE: Multidetector CT imaging of the cervical spine was performed without intravenous contrast. Multiplanar CT image reconstructions were also generated.  COMPARISON:  None.  FINDINGS: Advanced degenerative cervical spondylosis with multilevel disc disease and facet disease. The overall alignment is maintained. The spinal canal is fairly generous. There is a compression type fracture of the T1 vertebral body. There is also a laminar fracture on the left at C7.  No other definite fractures are identified. The C1-2 articulations are maintained. The dens is intact.  IMPRESSION: T1 compression fracture and left C7 laminar fracture.  Advanced degenerative cervical spondylosis with multilevel disc disease and facet disease.   Electronically Signed   By: Loralie ChampagneMark  Gallerani M.D.   On: 06/30/2014 21:44   Dg Foot Complete Right  06/30/2014   CLINICAL DATA:  Status post fall.  Bruising  EXAM: RIGHT FOOT COMPLETE - 3+ VIEW  COMPARISON:  None.  FINDINGS: The bones are osteopenic. There is mild degenerative changes at the first MTP joint. There is no evidence of arthropathy or other focal bone abnormality. Small plantar heel spur noted. Soft tissues are unremarkable. There is mild diffuse soft tissue swelling noted.  IMPRESSION: 1. Soft tissue swelling. 2. Mild chronic degenerative change.   Electronically Signed   By: Ladona Ridgelaylor  Bradly ChrisStroud M.D.   On: 06/30/2014 19:05     Assessment/Plan  1. Anemia of chronic  disease Follow-up CBC  2. Chronic kidney disease, stage II (mild) Follow-up CMP  3. Hypothyroidism, unspecified hypothyroidism type Follow-up TSH  4. Nodule of left lung Consider follow-up chest x-ray in August or September, 2016  5. Post herpetic neuralgia Improved  6. Abnormality of gait At risk for falling  7. Closed left hip fracture, sequela Mild discomfort. Controlled with current pain medications.  8. Essential hypertension Controlled  9. Memory loss Continue to monitor

## 2015-03-01 LAB — BASIC METABOLIC PANEL
BUN: 51 mg/dL — AB (ref 4–21)
Creatinine: 1 mg/dL (ref 0.5–1.1)
GLUCOSE: 78 mg/dL
Potassium: 4.3 mmol/L (ref 3.4–5.3)
SODIUM: 138 mmol/L (ref 137–147)

## 2015-03-01 LAB — HEPATIC FUNCTION PANEL
ALT: 8 U/L (ref 7–35)
AST: 12 U/L — AB (ref 13–35)
Alkaline Phosphatase: 63 U/L (ref 25–125)
Bilirubin, Total: 0.4 mg/dL

## 2015-03-01 LAB — CBC AND DIFFERENTIAL
HCT: 32 % — AB (ref 36–46)
HEMOGLOBIN: 10.7 g/dL — AB (ref 12.0–16.0)
PLATELETS: 214 10*3/uL (ref 150–399)
WBC: 7.7 10*3/mL

## 2015-03-01 LAB — TSH: TSH: 3.93 u[IU]/mL (ref 0.41–5.90)

## 2015-03-02 ENCOUNTER — Other Ambulatory Visit: Payer: Self-pay | Admitting: Nurse Practitioner

## 2015-03-02 DIAGNOSIS — R748 Abnormal levels of other serum enzymes: Secondary | ICD-10-CM

## 2015-03-02 DIAGNOSIS — E039 Hypothyroidism, unspecified: Secondary | ICD-10-CM

## 2015-03-02 DIAGNOSIS — N182 Chronic kidney disease, stage 2 (mild): Secondary | ICD-10-CM

## 2015-03-02 DIAGNOSIS — D638 Anemia in other chronic diseases classified elsewhere: Secondary | ICD-10-CM

## 2015-03-28 ENCOUNTER — Non-Acute Institutional Stay: Payer: Medicare PPO | Admitting: Nurse Practitioner

## 2015-03-28 ENCOUNTER — Encounter: Payer: Self-pay | Admitting: Nurse Practitioner

## 2015-03-28 DIAGNOSIS — Z8619 Personal history of other infectious and parasitic diseases: Secondary | ICD-10-CM | POA: Diagnosis not present

## 2015-03-28 DIAGNOSIS — K5900 Constipation, unspecified: Secondary | ICD-10-CM

## 2015-03-28 DIAGNOSIS — N182 Chronic kidney disease, stage 2 (mild): Secondary | ICD-10-CM

## 2015-03-28 DIAGNOSIS — D638 Anemia in other chronic diseases classified elsewhere: Secondary | ICD-10-CM

## 2015-03-28 DIAGNOSIS — K219 Gastro-esophageal reflux disease without esophagitis: Secondary | ICD-10-CM | POA: Diagnosis not present

## 2015-03-28 DIAGNOSIS — I10 Essential (primary) hypertension: Secondary | ICD-10-CM | POA: Diagnosis not present

## 2015-03-28 DIAGNOSIS — R609 Edema, unspecified: Secondary | ICD-10-CM

## 2015-03-28 DIAGNOSIS — J209 Acute bronchitis, unspecified: Secondary | ICD-10-CM | POA: Diagnosis not present

## 2015-03-28 DIAGNOSIS — J189 Pneumonia, unspecified organism: Secondary | ICD-10-CM | POA: Insufficient documentation

## 2015-03-28 DIAGNOSIS — E039 Hypothyroidism, unspecified: Secondary | ICD-10-CM

## 2015-03-28 DIAGNOSIS — R748 Abnormal levels of other serum enzymes: Secondary | ICD-10-CM

## 2015-03-28 NOTE — Assessment & Plan Note (Signed)
Malaise and expiratory wheezes. Augmentin 875mg  bid x 7 days, DuoNeb q6h x 3 days then prn x 1 wk, Medrol does pack.

## 2015-03-28 NOTE — Assessment & Plan Note (Signed)
Continued on home pain regimen of fentanyl patch 8712mcg/hr  and gabapentin 100mg  tid, and Tramadol 50mg  bid, and Tylenol 650mg  tid.

## 2015-03-28 NOTE — Assessment & Plan Note (Signed)
Trace BLE 

## 2015-03-28 NOTE — Assessment & Plan Note (Signed)
08/25/14 dc'd Senokot continued Colace.   

## 2015-03-28 NOTE — Assessment & Plan Note (Signed)
03/01/15 Hgb 10.7

## 2015-03-28 NOTE — Progress Notes (Signed)
Patient ID: Kim Hodges, female   DOB: Mar 12, 1909, 81 y.o.   MRN: [id number]   Code Status: DNR  No Known Allergies  Chief Complaint  Patient presents with  . Medical Management of Chronic Issues  . Acute Visit    malaise, wheezing    HPI: Patient is a 81 y.o. female seen in the SNF at [facility] today for evaluation of malaise, wheezes, and chronic medical conditions.  Problem List Items Addressed This Visit    Chronic kidney disease, stage II (mild) (Chronic)    03/01/15 Bun/creat 51/0.96       Hypothyroidism (Chronic)    Takes Levothyroxine daily. 03/23/14 TSH 2.553. 05/20/14 TSH 3.224. 03/01/15 TSH 3.927           Anemia of chronic disease (Chronic)    03/01/15 Hgb 10.7       Essential hypertension    controlled on a low-dose Coreg 3.125mg  bid.        History of shingles    Continued on home pain regimen of fentanyl patch 72mcg/hr  and gabapentin  tid, and Tramadol  bid, and Tylenol  tid.          Edema    Trace BLE       GERD (gastroesophageal reflux disease)    Stable, continue Omeprazole  daily.         Constipation    08/25/14 dc'd Senokot continued Colace.        Alkaline phosphatase elevation    03/01/15 Alkaline phosphatase 63. Observe.        Acute bronchitis - Primary    Malaise and expiratory wheezes. Augmentin  bid x 7 days, DuoNeb q6h x 3 days then prn x 1 wk, Medrol does pack.          Review of Systems:  Review of Systems  Constitutional: Positive for fatigue. Negative for fever, chills and diaphoresis.  HENT: Positive for hearing loss. Negative for congestion, ear discharge, ear pain, nosebleeds, sore throat and tinnitus.   Eyes: Negative for photophobia, pain, discharge and redness.  Respiratory: Positive for wheezing. Negative for cough, shortness of breath and stridor.   Cardiovascular: Positive for leg swelling. Negative for chest pain and palpitations.       Trace only    Gastrointestinal: Negative for nausea, vomiting, abdominal pain, diarrhea, constipation and blood in stool.  Endocrine: Negative for polydipsia.  Genitourinary: Positive for frequency. Negative for dysuria, urgency, hematuria and flank pain.  Musculoskeletal: Negative for myalgias, back pain and neck pain.  Skin: Negative for rash.  Allergic/Immunologic: Negative for environmental allergies.  Neurological: Negative for dizziness, tremors, seizures, weakness and headaches.  Hematological: Does not bruise/bleed easily.  Psychiatric/Behavioral: Negative for suicidal ideas and hallucinations. The patient is not nervous/anxious.      Past Medical History  Diagnosis Date  . Chronic kidney disease, stage II (mild) 12/25/2012  . Unspecified arthropathy, lower leg 09/11/2011  . Swelling of limb 09/10/2012  . Other and unspecified hyperlipidemia 07/26/2011  . Actinic keratosis 07/26/2011  . Abnormality of gait 07/26/2011  . Hyperglycemia 07/26/2011  . Tear film insufficiency, unspecified 12/29/2009  . Other malaise and fatigue 12/29/2009  . Unspecified hearing loss 06/30/2009  . Unspecified hypothyroidism 12/15/2008  . Unspecified essential hypertension 12/15/2008  . Unspecified glaucoma 12/15/2008    both eyes  . Weak 08/02/13  . Hyponatremia 08/02/13  . Herpes zoster 08/02/13  . Post herpetic neuralgia 08/02/13  . Cerebral atrophy 08/02/13  . Cerebrovascular disease 08/02/13  .  Atherosclerosis 08/02/13  . Abnormal chest x-ray 08/02/13    08/02/13 nodule in left mid to upper lung  . Murmur, cardiac 08/02/13    aortic ejection  . Fracture lumbar vertebra-closed 08/02/13    L1  . Memory changes 07/03/13  . Lung mass 07/01/2014  . Intertrochanteric fracture of left femur 06/30/14  . Fracture of T1 vertebra 06/30/14  . Fracture of C7 vertebra, closed 06/30/14  . History of fall 06/30/14  . GERD (gastroesophageal reflux disease) 06/03/2014  . Unspecified constipation 07/07/2014  . Anemia of chronic  disease 09/24/2013    05/20/14 Hgb 11.9 07/13/14 Hgb 10.6 09/28/14 Hgb 10.3-dc Fe, repeat CBC and Fe in one month.  10/28/13 Hgb 10.5, Iron 43   . Hypothyroidism 12/15/2008    03/23/14 TSH 2.553 05/20/14 TSH 3.224       . Memory loss 07/09/2013    07/09/13 MMSE 21/30, failed clock drawing    Past Surgical History  Procedure Laterality Date  . Small intestine surgery  1957  . Cardiac catheterization  2004  . Gallbladder surgery  2006  . Cholecystectomy    . Femur im nail Left 07/02/2014    Procedure: INTRAMEDULLARY (IM) NAIL FEMORAL;  Surgeon: Loanne Drilling, MD;  Location: [facility];  Service: Orthopedics;  Laterality: Left;   Social History:   reports that she has never smoked. She has never used smokeless tobacco. She reports that she does not drink alcohol or use illicit drugs.  Family History  Problem Relation Age of Onset  . Cancer Sister   . Cancer Brother     pancreatic  . Cancer Son     lung  . Cancer Brother     pancreatic    Medications: Patient's Medications  New Prescriptions   No medications on file  Previous Medications   ACETAMINOPHEN (TYLENOL ARTHRITIS PAIN) 650 MG CR TABLET    Take 650 mg by mouth 3 (three) times daily as needed for pain. May also take one tablet every 6 hours as needed for pain   CARVEDILOL (COREG) 3.125 MG TABLET    Take 1 tablet (3.125 mg total) by mouth 2 (two) times daily with a meal.   DOCUSATE SODIUM 100 MG CAPS    Take 100 mg by mouth 2 (two) times daily.   DORZOLAMIDE-TIMOLOL (COSOPT) 22.3-6.8 MG/ML OPHTHALMIC SOLUTION    Place 1 drop into both eyes 2 (two) times daily.    FEEDING SUPPLEMENT, ENSURE COMPLETE, (ENSURE COMPLETE) LIQD    Take 237 mLs by mouth 2 (two) times daily between meals.   FENTANYL (DURAGESIC - DOSED MCG/HR) 12 MCG/HR    Place 1 patch (12.5 mcg total) onto the skin every 3 (three) days. Remove old patch   FERROUS SULFATE 325 (65 FE) MG TABLET    Take 1 tablet (325 mg total) by mouth daily with breakfast.   GUAIFENESIN (MUCINEX)  600 MG 12 HR TABLET    Take 1 tablet (600 mg total) by mouth 2 (two) times daily. Take for 6 days then stop.   HYDROCODONE-ACETAMINOPHEN (NORCO/VICODIN) 5-325 MG PER TABLET       LEVOTHYROXINE (SYNTHROID, LEVOTHROID) 75 MCG TABLET    Take 75 mcg by mouth daily before breakfast.   OMEPRAZOLE (PRILOSEC) 20 MG CAPSULE    Take 20 mg by mouth daily.   SENNA (SENOKOT) 8.6 MG TABS TABLET    Take 1 tablet (8.6 mg total) by mouth 2 (two) times daily.   TRAMADOL (ULTRAM) 50 MG TABLET  Take one tablet by mouth twice daily for pain  Modified Medications   No medications on file  Discontinued Medications   No medications on file     Physical Exam: Physical Exam  Constitutional: She is oriented to person, place, and time. She appears well-developed and well-nourished. No distress.  HENT:  Head: Normocephalic and atraumatic.  Right Ear: External ear normal.  Left Ear: External ear normal.  Nose: Nose normal.  Mouth/Throat: Oropharynx is clear and moist. No oropharyngeal exudate.  Eyes: Conjunctivae and EOM are normal. Pupils are equal, round, and reactive to light. Right eye exhibits no discharge. Left eye exhibits no discharge. No scleral icterus.  Neck: Normal range of motion. Neck supple. No JVD present. No tracheal deviation present. No thyromegaly present.  Cardiovascular: Normal rate and regular rhythm.   Murmur heard. 3/6  Pulmonary/Chest: Effort normal. No stridor. No respiratory distress. She has wheezes. She has no rales. She exhibits no tenderness.  Abdominal: Soft. Bowel sounds are normal. She exhibits no distension. There is no tenderness. There is no rebound and no guarding.  Genitourinary: Guaiac negative stool.  Musculoskeletal: Normal range of motion. She exhibits edema. She exhibits no tenderness.  Mild edema entire left leg has improved since the left hip surgery. Trace edema in ankles. Pain is well managed with Fentanyl 5712mcg/hr patch.    Lymphadenopathy:    She has no  cervical adenopathy.  Neurological: She is alert and oriented to person, place, and time. She displays normal reflexes. No cranial nerve deficit. She exhibits normal muscle tone. Coordination normal.  Skin: Skin is warm and dry. No rash noted. She is not diaphoretic. No erythema. No pallor.  Let hip surgical scar.  Small keratotic lesions anterior LLE and calf witch is injured with serous fluid leakage. No erythema, warmth, odorous drainage, and tenderness.     Psychiatric: She has a normal mood and affect. Her behavior is normal. Judgment and thought content normal.    Filed Vitals:   03/28/15 1545  BP: 112/70  Pulse: 68  Temp: 98 F (36.7 C)  TempSrc: Tympanic  Resp: 20      Labs reviewed: Basic Metabolic Panel:  Recent Labs  40/98/1107/30/15  07/02/14 0433  07/04/14 0445 07/05/14 0445 07/06/14 0425 07/13/14 09/28/14 03/01/15  NA 135*  < >  --   < > 132* 135* 131* 136* 137 138  K 4.1  < >  --   < > 3.8 4.3 4.1 5.5* 4.2 4.3  CL  --   < >  --   < > 98 99 96  --   --   --   CO2  --   < >  --   < > 21 24 22   --   --   --   GLUCOSE  --   < >  --   < > 117* 121* 110*  --   --   --   BUN 25*  < >  --   < > 25* 35* 37* 38* 34* 51*  CREATININE 1.0  < >  --   < > 0.90 1.15* 1.01 1.1 0.8 1.0  CALCIUM  --   < >  --   < > 8.0* 8.0* 8.0*  --   --   --   TSH 3.22  --  1.990  --   --   --   --   --   --  3.93  < > = values in this interval not  displayed. Liver Function Tests:  Recent Labs  07/01/14 0454 07/13/14 09/28/14 03/01/15  AST 12*  ALT ALKPHOS 63 58 72 63  BILITOT 0.5  --   --   --   PROT 6.5  --   --   --   ALBUMIN 3.2*  --   --   --    No results for input(s): LIPASE, AMYLASE in the last 8760 hours. No results for input(s): AMMONIA in the last 8760 hours. CBC:  Recent Labs  06/30/14 1807 07/01/14 0632  07/04/14 2130 07/05/14 0445 07/06/14 0425  09/28/14 10/28/14 03/01/15  WBC 8.4 9.7  < > 12.0* 12.7* 11.6*  < > 9.1 7.3 7.7  NEUTROABS 5.8 6.9  --    --   --   --   --   --   --   --   HGB 10.6* 9.8*  < > 11.0* 10.5* 10.6*  < > 10.3* 10.5* 10.7*  HCT 32.7* 29.3*  < > 33.3* 30.7* 31.3*  < > 31* 33* 32*  MCV 91.1 89.6  < > 89.3 88.5 88.4  --   --   --   --   PLT 241 203  < > 189 205 250  < > 334 231 214  < > = values in this interval not displayed. Lipid Panel: No results for input(s): CHOL, HDL, LDLCALC, TRIG, CHOLHDL, LDLDIRECT in the last 8760 hours.  Past Procedures:  09/29/13 Echocardiogram: moderate AI, moderate to severe MR, moderate TR, moderate pulmonary hypertension, mild LLH, hyperdynamic LV systolic function, EF 75%.    05/24/14 CT chest w/o contrast:  IMPRESSION:  Enlarging masslike density in the area of prior linear scarring on  2009 chest CT. Findings are concerning for possible scar carcinoma.  This could be further evaluated with PET CT or tissue biopsy if felt  clinically indicated.  Trace left pleural effusion.  Mild cardiomegaly.  Small hiatal hernia.  Borderline low right paratracheal lymph node.  06/2014 hospital:    CT C-spine 06/30/2014   X-ray of the left hip 06/30/2014   X-ray of the right foot 06/30/2014   Chest x-ray 06/30/2001, 07/04/2014   X-ray of the right ankle 06/30/2014   2 d echo 07/01/14  Assessment/Plan Acute bronchitis Malaise and expiratory wheezes. Augmentin  bid x 7 days, DuoNeb q6h x 3 days then prn x 1 wk, Medrol does pack.    Alkaline phosphatase elevation 03/01/15 Alkaline phosphatase 63. Observe.     Constipation 08/25/14 dc'd Senokot continued Colace.     GERD (gastroesophageal reflux disease) Stable, continue Omeprazole  daily.      Edema Trace BLE    History of shingles Continued on home pain regimen of fentanyl patch 70mcg/hr  and gabapentin  tid, and Tramadol  bid, and Tylenol  tid.       Essential hypertension controlled on a low-dose Coreg 3.125mg  bid.     Anemia of chronic disease 03/01/15 Hgb  10.7    Hypothyroidism Takes Levothyroxine daily. 03/23/14 TSH 2.553. 05/20/14 TSH 3.224. 03/01/15 TSH 3.927        Chronic kidney disease, stage II (mild) 03/01/15 Bun/creat 51/0.96      Family/ Staff Communication: observe the patient  Goals of Care: SNF  Labs/tests ordered: none

## 2015-03-28 NOTE — Assessment & Plan Note (Signed)
03/01/15 Alkaline phosphatase 63. Observe.

## 2015-03-28 NOTE — Assessment & Plan Note (Signed)
controlled on a low-dose Coreg 3.125mg bid.    

## 2015-03-28 NOTE — Assessment & Plan Note (Signed)
Stable, continue Omeprazole 20mg daily.  

## 2015-03-28 NOTE — Assessment & Plan Note (Signed)
Takes Levothyroxine 75mcg daily. 03/23/14 TSH 2.553. 05/20/14 TSH 3.224. 03/01/15 TSH 3.927

## 2015-03-28 NOTE — Assessment & Plan Note (Signed)
03/01/15 Bun/creat 51/0.96

## 2015-04-19 LAB — HEPATIC FUNCTION PANEL
ALT: 8 U/L (ref 7–35)
AST: 12 U/L — AB (ref 13–35)
Alkaline Phosphatase: 60 U/L (ref 25–125)
BILIRUBIN, TOTAL: 0.3 mg/dL

## 2015-04-19 LAB — BASIC METABOLIC PANEL
BUN: 47 mg/dL — AB (ref 4–21)
Creatinine: 1.1 mg/dL (ref 0.5–1.1)
GLUCOSE: 133 mg/dL
Potassium: 5 mmol/L (ref 3.4–5.3)
SODIUM: 143 mmol/L (ref 137–147)

## 2015-04-19 LAB — CBC AND DIFFERENTIAL
HEMATOCRIT: 33 % — AB (ref 36–46)
Hemoglobin: 10.7 g/dL — AB (ref 12.0–16.0)
Platelets: 377 10*3/uL (ref 150–399)
WBC: 10.2 10^3/mL

## 2015-04-19 LAB — TSH: TSH: 2.97 u[IU]/mL (ref 0.41–5.90)

## 2015-04-20 ENCOUNTER — Non-Acute Institutional Stay: Payer: Medicare PPO | Admitting: Nurse Practitioner

## 2015-04-20 ENCOUNTER — Encounter: Payer: Self-pay | Admitting: Nurse Practitioner

## 2015-04-20 DIAGNOSIS — K5900 Constipation, unspecified: Secondary | ICD-10-CM

## 2015-04-20 DIAGNOSIS — I10 Essential (primary) hypertension: Secondary | ICD-10-CM

## 2015-04-20 DIAGNOSIS — I5020 Unspecified systolic (congestive) heart failure: Secondary | ICD-10-CM

## 2015-04-20 DIAGNOSIS — N182 Chronic kidney disease, stage 2 (mild): Secondary | ICD-10-CM

## 2015-04-20 DIAGNOSIS — B0229 Other postherpetic nervous system involvement: Secondary | ICD-10-CM | POA: Diagnosis not present

## 2015-04-20 DIAGNOSIS — E039 Hypothyroidism, unspecified: Secondary | ICD-10-CM

## 2015-04-20 DIAGNOSIS — D638 Anemia in other chronic diseases classified elsewhere: Secondary | ICD-10-CM | POA: Diagnosis not present

## 2015-04-20 DIAGNOSIS — J189 Pneumonia, unspecified organism: Secondary | ICD-10-CM | POA: Diagnosis not present

## 2015-04-20 DIAGNOSIS — I509 Heart failure, unspecified: Secondary | ICD-10-CM | POA: Insufficient documentation

## 2015-04-20 DIAGNOSIS — R609 Edema, unspecified: Secondary | ICD-10-CM

## 2015-04-20 DIAGNOSIS — K219 Gastro-esophageal reflux disease without esophagitis: Secondary | ICD-10-CM

## 2015-04-20 NOTE — Assessment & Plan Note (Signed)
04/19/15 Bun/creat 47/1.11

## 2015-04-20 NOTE — Assessment & Plan Note (Signed)
Continued on home pain regimen of fentanyl patch 5012mcg/hr  and gabapentin 100mg  tid, and Tylenol 650mg  tid prn

## 2015-04-20 NOTE — Assessment & Plan Note (Signed)
04/19/15 Hgb 10.7, off Iron

## 2015-04-20 NOTE — Assessment & Plan Note (Signed)
Trace BLE, L>R

## 2015-04-20 NOTE — Assessment & Plan Note (Signed)
controlled on a low-dose Coreg 3.125mg  bid.

## 2015-04-20 NOTE — Progress Notes (Signed)
Patient ID: Kim Hodges, female   DOB: [birth year], 81 y.o.   MRN: [id number]   Code Status: DNR  No Known Allergies  Chief Complaint  Patient presents with  . Medical Management of Chronic Issues  . Acute Visit    pneumonia    HPI: Patient is a 81 y.o. female seen in the SNF at [facility] today for evaluation of pneumonia  and chronic medical conditions.  Problem List Items Addressed This Visit    Chronic kidney disease, stage II (mild) (Chronic)    04/19/15 Bun/creat 47/1.11      Hypothyroidism (Chronic)    04/19/15 TSH 2.971. Takes Levothyroxine daily.       Post herpetic neuralgia (Chronic)    Continued on home pain regimen of fentanyl patch 70mcg/hr  and gabapentin 100mg  tid, and Tylenol 650mg  tid prn        Anemia of chronic disease (Chronic)    04/19/15 Hgb 10.7, off Iron      Essential hypertension    controlled on a low-dose Coreg 3.125mg  bid.         Edema    Trace BLE, L>R        GERD (gastroesophageal reflux disease)    Stable, continue Omeprazole 20mg  daily.         Constipation    08/25/14 dc'd Senokot continued Colace.         HCAP (healthcare-associated pneumonia) - Primary    04/15/15 CXR  Patchy leftlung consoidation and left effusion with a small right pleural effusion may represent acute multifocal infectious process or pulmonary edema She was treated for Malaise and expiratory wheezes. Augmentin 875mg  bid x 7 days, DuoNeb q6h x 3 days then prn x 1 wk, Medrol does pack. About 3 weeks ago 04/15/15 2 weeks course of Avelox, Lasix 20mg  x 3days, Neb, O2 04/20/15 improved clinically, will continue Furosemide 10mg  and Kcl x 2 weeks, f/u BMP      Congestive heart failure    04/15/15 CXR patchy left lung consolidation and left effusion with small right pleural effusion may represent acute multifocal infectious process or pulmonary edema.  04/19/15 BNP 533.4 04/20/15 Furosemide 10mg , Kcl daily x 2 weeks(completed  initial Furosemide 20mg  daily x 3 days), f/u BMP in 2 weeks.          Review of Systems:  Review of Systems  Constitutional: Positive for fatigue. Negative for fever, chills and diaphoresis.  HENT: Positive for hearing loss. Negative for congestion, ear discharge, ear pain, nosebleeds, sore throat and tinnitus.   Eyes: Negative for photophobia, pain, discharge and redness.  Respiratory: Positive for wheezing. Negative for cough, shortness of breath and stridor.   Cardiovascular: Positive for leg swelling. Negative for chest pain and palpitations.       Trace only  Gastrointestinal: Negative for nausea, vomiting, abdominal pain, diarrhea, constipation and blood in stool.  Endocrine: Negative for polydipsia.  Genitourinary: Positive for frequency. Negative for dysuria, urgency, hematuria and flank pain.  Musculoskeletal: Negative for myalgias, back pain and neck pain.  Skin: Negative for rash.  Allergic/Immunologic: Negative for environmental allergies.  Neurological: Negative for dizziness, tremors, seizures, weakness and headaches.  Hematological: Does not bruise/bleed easily.  Psychiatric/Behavioral: Negative for suicidal ideas and hallucinations. The patient is not nervous/anxious.      Past Medical History  Diagnosis Date  . Chronic kidney disease, stage II (mild) 12/25/2012  . Unspecified arthropathy, lower leg 09/11/2011  . Swelling of limb 09/10/2012  . Other  and unspecified hyperlipidemia 07/26/2011  . Actinic keratosis 07/26/2011  . Abnormality of gait 07/26/2011  . Hyperglycemia 07/26/2011  . Tear film insufficiency, unspecified 12/29/2009  . Other malaise and fatigue 12/29/2009  . Unspecified hearing loss 06/30/2009  . Unspecified hypothyroidism 12/15/2008  . Unspecified essential hypertension 12/15/2008  . Unspecified glaucoma 12/15/2008    both eyes  . Weak 08/02/13  . Hyponatremia 08/02/13  . Herpes zoster 08/02/13  . Post herpetic neuralgia 08/02/13  . Cerebral atrophy  08/02/13  . Cerebrovascular disease 08/02/13  . Atherosclerosis 08/02/13  . Abnormal chest x-ray 08/02/13    08/02/13 nodule in left mid to upper lung  . Murmur, cardiac 08/02/13    aortic ejection  . Fracture lumbar vertebra-closed 08/02/13    L1  . Memory changes 07/03/13  . Lung mass 07/01/2014  . Intertrochanteric fracture of left femur 06/30/14  . Fracture of T1 vertebra 06/30/14  . Fracture of C7 vertebra, closed 06/30/14  . History of fall 06/30/14  . GERD (gastroesophageal reflux disease) 06/03/2014  . Unspecified constipation 07/07/2014  . Anemia of chronic disease 09/24/2013    05/20/14 Hgb 11.9 07/13/14 Hgb 10.6 09/28/14 Hgb 10.3-dc Fe, repeat CBC and Fe in one month.  10/28/13 Hgb 10.5, Iron 43   . Hypothyroidism 12/15/2008    03/23/14 TSH 2.553 05/20/14 TSH 3.224       . Memory loss 07/09/2013    07/09/13 MMSE 21/30, failed clock drawing    Past Surgical History  Procedure Laterality Date  . Small intestine surgery  1957  . Cardiac catheterization  2004  . Gallbladder surgery  2006  . Cholecystectomy    . Femur im nail Left 07/02/2014    Procedure: INTRAMEDULLARY (IM) NAIL FEMORAL;  Surgeon: Loanne Drilling, MD;  Location: [facility];  Service: Orthopedics;  Laterality: Left;   Social History:   reports that she has never smoked. She has never used smokeless tobacco. She reports that she does not drink alcohol or use illicit drugs.  Family History  Problem Relation Age of Onset  . Cancer Sister   . Cancer Brother     pancreatic  . Cancer Son     lung  . Cancer Brother     pancreatic    Medications: Patient's Medications  New Prescriptions   No medications on file  Previous Medications   ACETAMINOPHEN (TYLENOL ARTHRITIS PAIN) 650 MG CR TABLET    Take 650 mg by mouth 3 (three) times daily as needed for pain. May also take one tablet every 6 hours as needed for pain   CARVEDILOL (COREG) 3.125 MG TABLET    Take 1 tablet (3.125 mg total) by mouth 2 (two) times daily with a meal.    DOCUSATE SODIUM 100 MG CAPS    Take 100 mg by mouth 2 (two) times daily.   DORZOLAMIDE-TIMOLOL (COSOPT) 22.3-6.8 MG/ML OPHTHALMIC SOLUTION    Place 1 drop into both eyes 2 (two) times daily.    FEEDING SUPPLEMENT, ENSURE COMPLETE, (ENSURE COMPLETE) LIQD    Take 237 mLs by mouth 2 (two) times daily between meals.   FENTANYL (DURAGESIC - DOSED MCG/HR) 12 MCG/HR    Place 1 patch (12.5 mcg total) onto the skin every 3 (three) days. Remove old patch   FERROUS SULFATE 325 (65 FE) MG TABLET    Take 1 tablet (325 mg total) by mouth daily with breakfast.   GUAIFENESIN (MUCINEX) 600 MG 12 HR TABLET    Take 1 tablet (600 mg total) by  mouth 2 (two) times daily. Take for 6 days then stop.   HYDROCODONE-ACETAMINOPHEN (NORCO/VICODIN) 5-325 MG PER TABLET       LEVOTHYROXINE (SYNTHROID, LEVOTHROID) 75 MCG TABLET    Take 75 mcg by mouth daily before breakfast.   OMEPRAZOLE (PRILOSEC) 20 MG CAPSULE    Take 20 mg by mouth daily.   SENNA (SENOKOT) 8.6 MG TABS TABLET    Take 1 tablet (8.6 mg total) by mouth 2 (two) times daily.   TRAMADOL (ULTRAM) 50 MG TABLET    Take one tablet by mouth twice daily for pain  Modified Medications   No medications on file  Discontinued Medications   No medications on file     Physical Exam: Physical Exam  Constitutional: She is oriented to person, place, and time. She appears well-developed and well-nourished. No distress.  HENT:  Head: Normocephalic and atraumatic.  Right Ear: External ear normal.  Left Ear: External ear normal.  Nose: Nose normal.  Mouth/Throat: Oropharynx is clear and moist. No oropharyngeal exudate.  Eyes: Conjunctivae and EOM are normal. Pupils are equal, round, and reactive to light. Right eye exhibits no discharge. Left eye exhibits no discharge. No scleral icterus.  Neck: Normal range of motion. Neck supple. No JVD present. No tracheal deviation present. No thyromegaly present.  Cardiovascular: Normal rate and regular rhythm.   Murmur heard. 3/6    Pulmonary/Chest: Effort normal. No stridor. No respiratory distress. She has wheezes. She has no rales. She exhibits no tenderness.  Abdominal: Soft. Bowel sounds are normal. She exhibits no distension. There is no tenderness. There is no rebound and no guarding.  Genitourinary: Guaiac negative stool.  Musculoskeletal: Normal range of motion. She exhibits edema. She exhibits no tenderness.  Mild edema entire left leg has improved since the left hip surgery. Trace edema in ankles. Pain is well managed with Fentanyl 2512mcg/hr patch.    Lymphadenopathy:    She has no cervical adenopathy.  Neurological: She is alert and oriented to person, place, and time. She displays normal reflexes. No cranial nerve deficit. She exhibits normal muscle tone. Coordination normal.  Skin: Skin is warm and dry. No rash noted. She is not diaphoretic. No erythema. No pallor.  Let hip surgical scar.  Small keratotic lesions anterior LLE and calf witch is injured with serous fluid leakage. No erythema, warmth, odorous drainage, and tenderness.     Psychiatric: She has a normal mood and affect. Her behavior is normal. Judgment and thought content normal.    Filed Vitals:   04/20/15 1350  BP: 118/76  Pulse: 76  Temp: 97.1 F (36.2 C)  TempSrc: Tympanic  Resp: 18      Labs reviewed: Basic Metabolic Panel:  Recent Labs  16/07/9608/11/15 0433  07/04/14 0445 07/05/14 0445 07/06/14 0425  09/28/14 03/01/15 04/19/15  NA  --   < > 132* 135* 131*  < > 137 138 143  K  --   < > 3.8 4.3 4.1  < > 4.2 4.3 5.0  CL  --   < > 98 99 96  --   --   --   --   CO2  --   < > 21 24 22   --   --   --   --   GLUCOSE  --   < > 117* 121* 110*  --   --   --   --   BUN  --   < > 25* 35* 37*  < > 34* 51* 47*  CREATININE  --   < > 0.90 1.15* 1.01  < > 0.8 1.0 1.1  CALCIUM  --   < > 8.0* 8.0* 8.0*  --   --   --   --   TSH 1.990  --   --   --   --   --   --  3.93 2.97  < > = values in this interval not displayed. Liver Function  Tests:  Recent Labs  07/01/14 3244  09/28/14 03/01/15 04/19/15  AST 17  < > 14 12* 12*  ALT 7  < > 8 8 8   ALKPHOS 63  < > 72 63 60  BILITOT 0.5  --   --   --   --   PROT 6.5  --   --   --   --   ALBUMIN 3.2*  --   --   --   --   < > = values in this interval not displayed. No results for input(s): LIPASE, AMYLASE in the last 8760 hours. No results for input(s): AMMONIA in the last 8760 hours. CBC:  Recent Labs  06/30/14 1807 07/01/14 0632  07/04/14 2130 07/05/14 0445 07/06/14 0425  10/28/14 03/01/15 04/19/15  WBC 8.4 9.7  < > 12.0* 12.7* 11.6*  < > 7.3 7.7 10.2  NEUTROABS 5.8 6.9  --   --   --   --   --   --   --   --   HGB 10.6* 9.8*  < > 11.0* 10.5* 10.6*  < > 10.5* 10.7* 10.7*  HCT 32.7* 29.3*  < > 33.3* 30.7* 31.3*  < > 33* 32* 33*  MCV 91.1 89.6  < > 89.3 88.5 88.4  --   --   --   --   PLT 241 203  < > 189 205 250  < > 231 214 377  < > = values in this interval not displayed. Lipid Panel: No results for input(s): CHOL, HDL, LDLCALC, TRIG, CHOLHDL, LDLDIRECT in the last 8760 hours.  Past Procedures:  09/29/13 Echocardiogram: moderate AI, moderate to severe MR, moderate TR, moderate pulmonary hypertension, mild LLH, hyperdynamic LV systolic function, EF 75%.    05/24/14 CT chest w/o contrast:  IMPRESSION:  Enlarging masslike density in the area of prior linear scarring on  2009 chest CT. Findings are concerning for possible scar carcinoma.  This could be further evaluated with PET CT or tissue biopsy if felt  clinically indicated.  Trace left pleural effusion.  Mild cardiomegaly.  Small hiatal hernia.  Borderline low right paratracheal lymph node.  06/2014 hospital:    CT C-spine 06/30/2014   X-ray of the left hip 06/30/2014   X-ray of the right foot 06/30/2014   Chest x-ray 06/30/2001, 07/04/2014   X-ray of the right ankle 06/30/2014   2 d echo 07/01/14  04/15/15 CXR  Patchy leftlung consoidation and left effusion with a small right  pleural effusion may represent acute multifocal infectious process or pulmonary edema  Assessment/Plan HCAP (healthcare-associated pneumonia) 04/15/15 CXR  Patchy leftlung consoidation and left effusion with a small right pleural effusion may represent acute multifocal infectious process or pulmonary edema She was treated for Malaise and expiratory wheezes. Augmentin 875mg  bid x 7 days, DuoNeb q6h x 3 days then prn x 1 wk, Medrol does pack. About 3 weeks ago 04/15/15 2 weeks course of Avelox, Lasix 20mg  x 3days, Neb, O2 04/20/15 improved clinically, will continue Furosemide 10mg  and Kcl x 2 weeks,  f/u BMP  Congestive heart failure 04/15/15 CXR patchy left lung consolidation and left effusion with small right pleural effusion may represent acute multifocal infectious process or pulmonary edema.  04/19/15 BNP 533.4 04/20/15 Furosemide , Kcl daily x 2 weeks(completed initial Furosemide  daily x 3 days), f/u BMP in 2 weeks.   Chronic kidney disease, stage II (mild) 04/19/15 Bun/creat 47/1.11  Hypothyroidism 04/19/15 TSH 2.971. Takes Levothyroxine daily.   Post herpetic neuralgia Continued on home pain regimen of fentanyl patch 37mcg/hr  and gabapentin  tid, and Tylenol  tid prn    Anemia of chronic disease 04/19/15 Hgb 10.7, off Iron  Essential hypertension controlled on a low-dose Coreg 3.125mg  bid.     Edema Trace BLE, L>R    GERD (gastroesophageal reflux disease) Stable, continue Omeprazole  daily.     Constipation 08/25/14 dc'd Senokot continued Colace.       Family/ Staff Communication: observe the patient  Goals of Care: SNF  Labs/tests ordered: none

## 2015-04-20 NOTE — Assessment & Plan Note (Signed)
04/19/15 TSH 2.971. Takes Levothyroxine 75mcg daily.

## 2015-04-20 NOTE — Assessment & Plan Note (Signed)
04/15/15 CXR patchy left lung consolidation and left effusion with small right pleural effusion may represent acute multifocal infectious process or pulmonary edema.  04/19/15 BNP 533.4 04/20/15 Furosemide 10mg , Kcl 10meq daily x 2 weeks(completed initial Furosemide 20mg  daily x 3 days), f/u BMP in 2 weeks.

## 2015-04-20 NOTE — Assessment & Plan Note (Signed)
Stable, continue Omeprazole 20mg daily.  

## 2015-04-20 NOTE — Assessment & Plan Note (Signed)
08/25/14 dc'd Senokot continued Colace.   

## 2015-04-20 NOTE — Assessment & Plan Note (Signed)
04/15/15 CXR  Patchy leftlung consoidation and left effusion with a small right pleural effusion may represent acute multifocal infectious process or pulmonary edema She was treated for Malaise and expiratory wheezes. Augmentin 875mg  bid x 7 days, DuoNeb q6h x 3 days then prn x 1 wk, Medrol does pack. About 3 weeks ago 04/15/15 2 weeks course of Avelox, Lasix 20mg  x 3days, Neb, O2 04/20/15 improved clinically, will continue Furosemide 10mg  and Kcl 10meq x 2 weeks, f/u BMP

## 2015-04-28 ENCOUNTER — Non-Acute Institutional Stay: Payer: Medicare PPO | Admitting: Nurse Practitioner

## 2015-04-28 ENCOUNTER — Encounter: Payer: Self-pay | Admitting: Nurse Practitioner

## 2015-04-28 DIAGNOSIS — K5900 Constipation, unspecified: Secondary | ICD-10-CM

## 2015-04-28 DIAGNOSIS — I10 Essential (primary) hypertension: Secondary | ICD-10-CM | POA: Diagnosis not present

## 2015-04-28 DIAGNOSIS — I5040 Unspecified combined systolic (congestive) and diastolic (congestive) heart failure: Secondary | ICD-10-CM

## 2015-04-28 DIAGNOSIS — J189 Pneumonia, unspecified organism: Secondary | ICD-10-CM

## 2015-04-28 DIAGNOSIS — K219 Gastro-esophageal reflux disease without esophagitis: Secondary | ICD-10-CM | POA: Diagnosis not present

## 2015-04-28 DIAGNOSIS — E039 Hypothyroidism, unspecified: Secondary | ICD-10-CM

## 2015-04-28 DIAGNOSIS — D638 Anemia in other chronic diseases classified elsewhere: Secondary | ICD-10-CM | POA: Diagnosis not present

## 2015-04-28 DIAGNOSIS — B0229 Other postherpetic nervous system involvement: Secondary | ICD-10-CM | POA: Diagnosis not present

## 2015-04-28 DIAGNOSIS — R609 Edema, unspecified: Secondary | ICD-10-CM | POA: Diagnosis not present

## 2015-04-28 DIAGNOSIS — N182 Chronic kidney disease, stage 2 (mild): Secondary | ICD-10-CM

## 2015-04-28 LAB — BASIC METABOLIC PANEL
BUN: 38 mg/dL — AB (ref 4–21)
Creatinine: 1.2 mg/dL — AB (ref 0.5–1.1)
GLUCOSE: 150 mg/dL
Potassium: 4.9 mmol/L (ref 3.4–5.3)
SODIUM: 139 mmol/L (ref 137–147)

## 2015-04-28 LAB — CBC AND DIFFERENTIAL
HEMATOCRIT: 31 % — AB (ref 36–46)
HEMOGLOBIN: 10 g/dL — AB (ref 12.0–16.0)
Platelets: 286 10*3/uL (ref 150–399)
WBC: 17.6 10^3/mL

## 2015-04-28 LAB — HEPATIC FUNCTION PANEL
ALK PHOS: 65 U/L (ref 25–125)
ALT: 12 U/L (ref 7–35)
AST: 17 U/L (ref 13–35)

## 2015-04-28 NOTE — Assessment & Plan Note (Signed)
controlled on a low-dose Coreg 3.125mg bid.    

## 2015-04-28 NOTE — Assessment & Plan Note (Signed)
04/19/15 Bun/creat 47/1.11, pending CMP

## 2015-04-28 NOTE — Assessment & Plan Note (Signed)
Continued on home pain regimen of fentanyl patch 12mcg/hr  and gabapentin 100mg tid, and Tylenol 650mg tid prn   

## 2015-04-28 NOTE — Progress Notes (Signed)
Patient ID: Kim Hodges, female   DOB: 1909-10-02, 81 y.o.   MRN: [id number]   Code Status: DNR  No Known Allergies  Chief Complaint  Patient presents with  . Medical Management of Chronic Issues  . Acute Visit    cough, O2 desaturation.     HPI: Patient is a 81 y.o. female seen in the SNF at [facility] today for evaluation of persisted cough, O2 desaturation,  and chronic medical conditions.  Problem List Items Addressed This Visit    Chronic kidney disease, stage II (mild) (Chronic)    04/19/15 Bun/creat 47/1.11, pending CMP       Hypothyroidism (Chronic)    04/19/15 TSH 2.971. Takes Levothyroxine daily.        Post herpetic neuralgia (Chronic)    Continued on home pain regimen of fentanyl patch 74mcg/hr  and gabapentin 100mg  tid, and Tylenol 650mg  tid prn        Anemia of chronic disease (Chronic)    04/19/15 Hgb 10.7, off Iron, update CBC       Essential hypertension    controlled on a low-dose Coreg 3.125mg  bid.         Edema    Persists, adding Furosemide 20mg  daily.       GERD (gastroesophageal reflux disease)    Stable, continue Omeprazole 20mg  daily.        Constipation    08/25/14 dc'd Senokot continued Colace.        HCAP (healthcare-associated pneumonia) - Primary    04/28/15 CXR cardiomegaly, fluid overload in the right lung, superimposed/underlying pneumonia, a malignant process in the left hemithorax would be difficult as well to exclude. --complete 14 day course Avelox, adding Prednisone 20mg  daily for persisted cough/rales/O2 desaturation, Net q4h x 11 wk. Observe, POA made no hospitalization       Congestive heart failure    04/28/15 adding Furosemide 20mg  daily, update CBC, CMP pending.          Review of Systems:  Review of Systems  Constitutional: Positive for fatigue. Negative for fever, chills and diaphoresis.  HENT: Positive for hearing loss. Negative for congestion, ear discharge, ear pain, nosebleeds, sore  throat and tinnitus.   Eyes: Negative for photophobia, pain, discharge and redness.  Respiratory: Positive for cough and wheezing. Negative for shortness of breath and stridor.   Cardiovascular: Positive for leg swelling. Negative for chest pain and palpitations.       Trace only  Gastrointestinal: Negative for nausea, vomiting, abdominal pain, diarrhea, constipation and blood in stool.  Endocrine: Negative for polydipsia.  Genitourinary: Positive for frequency. Negative for dysuria, urgency, hematuria and flank pain.  Musculoskeletal: Negative for myalgias, back pain and neck pain.  Skin: Negative for rash.  Allergic/Immunologic: Negative for environmental allergies.  Neurological: Negative for dizziness, tremors, seizures, weakness and headaches.  Hematological: Does not bruise/bleed easily.  Psychiatric/Behavioral: Negative for suicidal ideas and hallucinations. The patient is not nervous/anxious.      Past Medical History  Diagnosis Date  . Chronic kidney disease, stage II (mild) 12/25/2012  . Unspecified arthropathy, lower leg 09/11/2011  . Swelling of limb 09/10/2012  . Other and unspecified hyperlipidemia 07/26/2011  . Actinic keratosis 07/26/2011  . Abnormality of gait 07/26/2011  . Hyperglycemia 07/26/2011  . Tear film insufficiency, unspecified 12/29/2009  . Other malaise and fatigue 12/29/2009  . Unspecified hearing loss 06/30/2009  . Unspecified hypothyroidism 12/15/2008  . Unspecified essential hypertension 12/15/2008  . Unspecified glaucoma 12/15/2008    both  eyes  . Weak 08/02/13  . Hyponatremia 08/02/13  . Herpes zoster 08/02/13  . Post herpetic neuralgia 08/02/13  . Cerebral atrophy 08/02/13  . Cerebrovascular disease 08/02/13  . Atherosclerosis 08/02/13  . Abnormal chest x-ray 08/02/13    08/02/13 nodule in left mid to upper lung  . Murmur, cardiac 08/02/13    aortic ejection  . Fracture lumbar vertebra-closed 08/02/13    L1  . Memory changes 07/03/13  . Lung mass  07/01/2014  . Intertrochanteric fracture of left femur 06/30/14  . Fracture of T1 vertebra 06/30/14  . Fracture of C7 vertebra, closed 06/30/14  . History of fall 06/30/14  . GERD (gastroesophageal reflux disease) 06/03/2014  . Unspecified constipation 07/07/2014  . Anemia of chronic disease 09/24/2013    05/20/14 Hgb 11.9 07/13/14 Hgb 10.6 09/28/14 Hgb 10.3-dc Fe, repeat CBC and Fe in one month.  10/28/13 Hgb 10.5, Iron 43   . Hypothyroidism 12/15/2008    03/23/14 TSH 2.553 05/20/14 TSH 3.224       . Memory loss 07/09/2013    07/09/13 MMSE 21/30, failed clock drawing    Past Surgical History  Procedure Laterality Date  . Small intestine surgery  1957  . Cardiac catheterization  2004  . Gallbladder surgery  2006  . Cholecystectomy    . Femur im nail Left 07/02/2014    Procedure: INTRAMEDULLARY (IM) NAIL FEMORAL;  Surgeon: Loanne Drilling, MD;  Location: [facility];  Service: Orthopedics;  Laterality: Left;   Social History:   reports that she has never smoked. She has never used smokeless tobacco. She reports that she does not drink alcohol or use illicit drugs.  Family History  Problem Relation Age of Onset  . Cancer Sister   . Cancer Brother     pancreatic  . Cancer Son     lung  . Cancer Brother     pancreatic    Medications: Patient's Medications  New Prescriptions   No medications on file  Previous Medications   ACETAMINOPHEN (TYLENOL ARTHRITIS PAIN) 650 MG CR TABLET    Take 650 mg by mouth 3 (three) times daily as needed for pain. May also take one tablet every 6 hours as needed for pain   CARVEDILOL (COREG) 3.125 MG TABLET    Take 1 tablet (3.125 mg total) by mouth 2 (two) times daily with a meal.   DOCUSATE SODIUM 100 MG CAPS    Take 100 mg by mouth 2 (two) times daily.   DORZOLAMIDE-TIMOLOL (COSOPT) 22.3-6.8 MG/ML OPHTHALMIC SOLUTION    Place 1 drop into both eyes 2 (two) times daily.    FEEDING SUPPLEMENT, ENSURE COMPLETE, (ENSURE COMPLETE) LIQD    Take 237 mLs by mouth 2 (two) times  daily between meals.   FENTANYL (DURAGESIC - DOSED MCG/HR) 12 MCG/HR    Place 1 patch (12.5 mcg total) onto the skin every 3 (three) days. Remove old patch   FERROUS SULFATE 325 (65 FE) MG TABLET    Take 1 tablet (325 mg total) by mouth daily with breakfast.   GUAIFENESIN (MUCINEX) 600 MG 12 HR TABLET    Take 1 tablet (600 mg total) by mouth 2 (two) times daily. Take for 6 days then stop.   HYDROCODONE-ACETAMINOPHEN (NORCO/VICODIN) 5-325 MG PER TABLET       LEVOTHYROXINE (SYNTHROID, LEVOTHROID) 75 MCG TABLET    Take 75 mcg by mouth daily before breakfast.   OMEPRAZOLE (PRILOSEC) 20 MG CAPSULE    Take 20 mg by mouth daily.  SENNA (SENOKOT) 8.6 MG TABS TABLET    Take 1 tablet (8.6 mg total) by mouth 2 (two) times daily.   TRAMADOL (ULTRAM) 50 MG TABLET    Take one tablet by mouth twice daily for pain  Modified Medications   No medications on file  Discontinued Medications   No medications on file     Physical Exam: Physical Exam  Constitutional: She is oriented to person, place, and time. She appears well-developed and well-nourished. No distress.  HENT:  Head: Normocephalic and atraumatic.  Right Ear: External ear normal.  Left Ear: External ear normal.  Nose: Nose normal.  Mouth/Throat: Oropharynx is clear and moist. No oropharyngeal exudate.  Eyes: Conjunctivae and EOM are normal. Pupils are equal, round, and reactive to light. Right eye exhibits no discharge. Left eye exhibits no discharge. No scleral icterus.  Neck: Normal range of motion. Neck supple. No JVD present. No tracheal deviation present. No thyromegaly present.  Cardiovascular: Normal rate and regular rhythm.   Murmur heard. 3/6  Pulmonary/Chest: Effort normal. No stridor. No respiratory distress. She has wheezes. She has rales. She exhibits no tenderness.  Abdominal: Soft. Bowel sounds are normal. She exhibits no distension. There is no tenderness. There is no rebound and no guarding.  Genitourinary: Guaiac negative  stool.  Musculoskeletal: Normal range of motion. She exhibits edema. She exhibits no tenderness.  Mild edema entire left leg has improved since the left hip surgery. Trace edema in ankles. Pain is well managed with Fentanyl 25mcg/hr patch.    Lymphadenopathy:    She has no cervical adenopathy.  Neurological: She is alert and oriented to person, place, and time. She displays normal reflexes. No cranial nerve deficit. She exhibits normal muscle tone. Coordination normal.  Skin: Skin is warm and dry. No rash noted. She is not diaphoretic. No erythema. No pallor.  Let hip surgical scar.  Small keratotic lesions anterior LLE and calf witch is injured with serous fluid leakage. No erythema, warmth, odorous drainage, and tenderness.     Psychiatric: She has a normal mood and affect. Her behavior is normal. Judgment and thought content normal.    Filed Vitals:   04/28/15 1133  BP: 120/60  Pulse: 80  Temp: 96.9 F (36.1 C)  TempSrc: Tympanic  Resp: 20      Labs reviewed: Basic Metabolic Panel:  Recent Labs  16/10/96 0433  07/04/14 0445 07/05/14 0445 07/06/14 0425  09/28/14 03/01/15 04/19/15  NA  --   < > 132* 135* 131*  < > 137 138 143  K  --   < > 3.8 4.3 4.1  < > 4.2 4.3 5.0  CL  --   < > 98 99 96  --   --   --   --   CO2  --   < > 21 24 22   --   --   --   --   GLUCOSE  --   < > 117* 121* 110*  --   --   --   --   BUN  --   < > 25* 35* 37*  < > 34* 51* 47*  CREATININE  --   < > 0.90 1.15* 1.01  < > 0.8 1.0 1.1  CALCIUM  --   < > 8.0* 8.0* 8.0*  --   --   --   --   TSH 1.990  --   --   --   --   --   --  3.93  2.97  < > = values in this interval not displayed. Liver Function Tests:  Recent Labs  07/01/14 [id number]  09/28/14 03/01/15 04/19/15  AST 17  < > 14 12* 12*  ALT 7  < > 8 8 8   ALKPHOS 63  < > 72 63 60  BILITOT 0.5  --   --   --   --   PROT 6.5  --   --   --   --   ALBUMIN 3.2*  --   --   --   --   < > = values in this interval not displayed. No results for input(s):  LIPASE, AMYLASE in the last 8760 hours. No results for input(s): AMMONIA in the last 8760 hours. CBC:  Recent Labs  06/30/14 1807 07/01/14 0632  07/04/14 2130 07/05/14 0445 07/06/14 0425  10/28/14 03/01/15 04/19/15  WBC 8.4 9.7  < > 12.0* 12.7* 11.6*  < > 7.3 7.7 10.2  NEUTROABS 5.8 6.9  --   --   --   --   --   --   --   --   HGB 10.6* 9.8*  < > 11.0* 10.5* 10.6*  < > 10.5* 10.7* 10.7*  HCT 32.7* 29.3*  < > 33.3* 30.7* 31.3*  < > 33* 32* 33*  MCV 91.1 89.6  < > 89.3 88.5 88.4  --   --   --   --   PLT 241 203  < > 189 205 250  < > 231 214 377  < > = values in this interval not displayed. Lipid Panel: No results for input(s): CHOL, HDL, LDLCALC, TRIG, CHOLHDL, LDLDIRECT in the last 8760 hours.  Past Procedures:  09/29/13 Echocardiogram: moderate AI, moderate to severe MR, moderate TR, moderate pulmonary hypertension, mild LLH, hyperdynamic LV systolic function, EF 75%.    05/24/14 CT chest w/o contrast:  IMPRESSION:  Enlarging masslike density in the area of prior linear scarring on  2009 chest CT. Findings are concerning for possible scar carcinoma.  This could be further evaluated with PET CT or tissue biopsy if felt  clinically indicated.  Trace left pleural effusion.  Mild cardiomegaly.  Small hiatal hernia.  Borderline low right paratracheal lymph node.  06/2014 hospital:    CT C-spine 06/30/2014   X-ray of the left hip 06/30/2014   X-ray of the right foot 06/30/2014   Chest x-ray 06/30/2001, 07/04/2014   X-ray of the right ankle 06/30/2014   2 d echo 07/01/14  04/15/15 CXR  Patchy leftlung consoidation and left effusion with a small right pleural effusion may represent acute multifocal infectious process or pulmonary edema  04/28/15 CXR cardiomegaly, fluid overload in the right lung, superimposed/underlying pneumonia, a malignant process in the left hemithorax would be difficult as well to exclude.   Assessment/Plan HCAP (healthcare-associated  pneumonia) 04/28/15 CXR cardiomegaly, fluid overload in the right lung, superimposed/underlying pneumonia, a malignant process in the left hemithorax would be difficult as well to exclude. --complete 14 day course Avelox, adding Prednisone 20mg  daily for persisted cough/rales/O2 desaturation, Net q4h x 11 wk. Observe, POA made no hospitalization   Congestive heart failure 04/28/15 adding Furosemide 20mg  daily, update CBC, CMP pending.   Chronic kidney disease, stage II (mild) 04/19/15 Bun/creat 47/1.11, pending CMP   Hypothyroidism 04/19/15 TSH 2.971. Takes Levothyroxine 75mcg daily.    Post herpetic neuralgia Continued on home pain regimen of fentanyl patch 3412mcg/hr  and gabapentin 100mg  tid, and Tylenol 650mg  tid prn    Anemia  of chronic disease 04/19/15 Hgb 10.7, off Iron, update CBC   Essential hypertension controlled on a low-dose Coreg 3.125mg  bid.     Edema Persists, adding Furosemide  daily.   GERD (gastroesophageal reflux disease) Stable, continue Omeprazole  daily.    Constipation 08/25/14 dc'd Senokot continued Colace.      Family/ Staff Communication: observe the patient  Goals of Care: SNF  Labs/tests ordered: CBC and CMP pending.

## 2015-04-28 NOTE — Assessment & Plan Note (Signed)
04/28/15 adding Furosemide 20mg  daily, update CBC, CMP pending.

## 2015-04-28 NOTE — Assessment & Plan Note (Signed)
04/19/15 TSH 2.971. Takes Levothyroxine 75mcg daily.  

## 2015-04-28 NOTE — Assessment & Plan Note (Signed)
Stable, continue Omeprazole 20mg daily.  

## 2015-04-28 NOTE — Assessment & Plan Note (Signed)
Persists, adding Furosemide  daily.

## 2015-04-28 NOTE — Assessment & Plan Note (Signed)
04/19/15 Hgb 10.7, off Iron, update CBC

## 2015-04-28 NOTE — Assessment & Plan Note (Signed)
04/28/15 CXR cardiomegaly, fluid overload in the right lung, superimposed/underlying pneumonia, a malignant process in the left hemithorax would be difficult as well to exclude. --complete 14 day course Avelox, adding Prednisone 20mg  daily for persisted cough/rales/O2 desaturation, Net q4h x 11 wk. Observe, POA made no hospitalization

## 2015-04-28 NOTE — Assessment & Plan Note (Signed)
08/25/14 dc'd Senokot continued Colace.

## 2015-05-02 ENCOUNTER — Other Ambulatory Visit: Payer: Self-pay | Admitting: Nurse Practitioner

## 2015-05-02 DIAGNOSIS — R748 Abnormal levels of other serum enzymes: Secondary | ICD-10-CM

## 2015-05-02 DIAGNOSIS — R609 Edema, unspecified: Secondary | ICD-10-CM

## 2015-05-02 DIAGNOSIS — N182 Chronic kidney disease, stage 2 (mild): Secondary | ICD-10-CM

## 2015-05-02 DIAGNOSIS — D638 Anemia in other chronic diseases classified elsewhere: Secondary | ICD-10-CM

## 2015-06-22 ENCOUNTER — Non-Acute Institutional Stay (SKILLED_NURSING_FACILITY): Payer: Medicare PPO | Admitting: Nurse Practitioner

## 2015-06-22 ENCOUNTER — Encounter: Payer: Self-pay | Admitting: Nurse Practitioner

## 2015-06-22 DIAGNOSIS — B0229 Other postherpetic nervous system involvement: Secondary | ICD-10-CM

## 2015-06-22 DIAGNOSIS — I10 Essential (primary) hypertension: Secondary | ICD-10-CM

## 2015-06-22 DIAGNOSIS — D638 Anemia in other chronic diseases classified elsewhere: Secondary | ICD-10-CM

## 2015-06-22 DIAGNOSIS — E039 Hypothyroidism, unspecified: Secondary | ICD-10-CM

## 2015-06-22 DIAGNOSIS — I509 Heart failure, unspecified: Secondary | ICD-10-CM

## 2015-06-22 DIAGNOSIS — R609 Edema, unspecified: Secondary | ICD-10-CM

## 2015-06-22 DIAGNOSIS — R5381 Other malaise: Secondary | ICD-10-CM

## 2015-06-22 NOTE — Assessment & Plan Note (Signed)
Contributory factors: advanced age, CHF, last PNA 04/2015. The patient desires no hospitalization, comfort measures only. She stated its blessing she is not in pain or discomfort. No respiratory distress or SOB. She does takes O2 [place], able to maintain Sat O2>89%.

## 2015-06-22 NOTE — Assessment & Plan Note (Signed)
Compensated clinically, dc Furosemide, observe for s/s of developing CHF

## 2015-06-22 NOTE — Assessment & Plan Note (Signed)
Continued on pain regimen of fentanyl patch 69mcg/hr  and gabapentin  tid, and Tylenol  tid prn, denied pain today.

## 2015-06-22 NOTE — Assessment & Plan Note (Signed)
04/19/15 TSH 2.971. Continue Levothyroxine daily

## 2015-06-22 NOTE — Assessment & Plan Note (Addendum)
Controlled, continue Coreg 3.125mg  bid. Dc ASA R>B presently.

## 2015-06-22 NOTE — Assessment & Plan Note (Signed)
Resolved, not adequate oral intake as resultant of her debilitation, dc Furosemide, observe the patient.

## 2015-06-22 NOTE — Assessment & Plan Note (Signed)
04/19/15 Hgb 10.7, 04/28/15 Hgb 10.0, off Iron

## 2015-06-22 NOTE — Progress Notes (Signed)
Patient ID: Kim Hodges, female   DOB: 1909-07-31, 81 y.o.   MRN: [id number]  Location:  SNF [facility] Provider:  Chipper Oman NP  Code Status:  DNR Goals of care: Advanced Directive information    Chief Complaint  Patient presents with  . Medical Management of Chronic Issues  . Acute Visit    deconditioning     HPI: Patient is a 81 y.o. female seen in the SNF at [facility] today for evaluation of gradual debilitating, generally she is weaker, less oral intake, sleeps more, denied pain, chest pain or congestion, less BLE edema since her oral intake became limited.   Review of Systems:  Review of Systems  Constitutional: Positive for weight loss. Negative for fever, chills and diaphoresis.       Generalized weakness  HENT: Positive for hearing loss. Negative for congestion, ear pain and nosebleeds.   Eyes: Negative for photophobia, pain, discharge and redness.  Respiratory: Negative for cough, shortness of breath, wheezing and stridor.   Cardiovascular: Negative for chest pain, palpitations and leg swelling.  Gastrointestinal: Negative for nausea, vomiting, abdominal pain, diarrhea and constipation.  Genitourinary: Positive for frequency. Negative for dysuria, urgency and flank pain.  Musculoskeletal: Negative for myalgias, back pain and neck pain.  Skin: Negative for rash.  Neurological: Positive for weakness. Negative for dizziness, tremors, seizures and headaches.  Psychiatric/Behavioral: The patient is not nervous/anxious.     Past Medical History  Diagnosis Date  . Chronic kidney disease, stage II (mild) 12/25/2012  . Unspecified arthropathy, lower leg 09/11/2011  . Swelling of limb 09/10/2012  . Other and unspecified hyperlipidemia 07/26/2011  . Actinic keratosis 07/26/2011  . Abnormality of gait 07/26/2011  . Hyperglycemia 07/26/2011  . Tear film insufficiency, unspecified 12/29/2009  . Other malaise and fatigue 12/29/2009  . Unspecified hearing loss 06/30/2009  .  Unspecified hypothyroidism 12/15/2008  . Unspecified essential hypertension 12/15/2008  . Unspecified glaucoma 12/15/2008    both eyes  . Weak 08/02/13  . Hyponatremia 08/02/13  . Herpes zoster 08/02/13  . Post herpetic neuralgia 08/02/13  . Cerebral atrophy 08/02/13  . Cerebrovascular disease 08/02/13  . Atherosclerosis 08/02/13  . Abnormal chest x-ray 08/02/13    08/02/13 nodule in left mid to upper lung  . Murmur, cardiac 08/02/13    aortic ejection  . Fracture lumbar vertebra-closed 08/02/13    L1  . Memory changes 07/03/13  . Lung mass 07/01/2014  . Intertrochanteric fracture of left femur 06/30/14  . Fracture of T1 vertebra 06/30/14  . Fracture of C7 vertebra, closed 06/30/14  . History of fall 06/30/14  . GERD (gastroesophageal reflux disease) 06/03/2014  . Unspecified constipation 07/07/2014  . Anemia of chronic disease 09/24/2013    05/20/14 Hgb 11.9 07/13/14 Hgb 10.6 09/28/14 Hgb 10.3-dc Fe, repeat CBC and Fe in one month.  10/28/13 Hgb 10.5, Iron 43   . Hypothyroidism 12/15/2008    03/23/14 TSH 2.553 05/20/14 TSH 3.224       . Memory loss 07/09/2013    07/09/13 MMSE 21/30, failed clock drawing     Patient Active Problem List   Diagnosis Date Noted  . Debilitated 06/22/2015  . Congestive heart failure 04/20/2015  . HCAP (healthcare-associated pneumonia) 03/28/2015  . Alkaline phosphatase elevation 09/23/2014  . Constipation 07/07/2014  . Closed fracture of intertrochanteric section of femur 07/02/2014  . C7 cervical fracture 07/01/2014  . Closed left hip fracture 06/30/2014  . History of fall 06/30/2014  . Intertrochanteric fracture of left femur 06/30/2014  .  Fracture of T1 vertebra 06/30/2014  . GERD (gastroesophageal reflux disease) 06/03/2014  . Loss of weight 05/27/2014  . Pain of left scapula 05/20/2014  . Pulmonary hypertension 09/24/2013  . Anemia of chronic disease 09/24/2013  . Edema 09/24/2013  . Atherosclerosis of aorta 08/05/2013  . Nodule of left lung 08/05/2013  .  Compression fracture of L1 lumbar vertebra 08/05/2013  . History of shingles 08/02/2013  . Weak 08/02/2013  . Post herpetic neuralgia 08/02/2013  . Cerebral atrophy 08/02/2013  . Cerebrovascular disease 08/02/2013  . Atherosclerosis 08/02/2013  . Abnormal chest x-ray 08/02/2013  . Murmur, cardiac 08/02/2013  . Fracture lumbar vertebra-closed 08/02/2013  . Memory loss 07/09/2013  . Chronic kidney disease, stage II (mild) 12/25/2012  . Other and unspecified hyperlipidemia 07/26/2011  . Abnormality of gait 07/26/2011  . Hyperglycemia 07/26/2011  . Hypothyroidism 12/15/2008  . Essential hypertension 12/15/2008  . Glaucoma 12/15/2008    No Known Allergies  Medications: Patient's Medications  New Prescriptions   No medications on file  Previous Medications   ACETAMINOPHEN (TYLENOL ARTHRITIS PAIN) 650 MG CR TABLET    Take 650 mg by mouth 3 (three) times daily as needed for pain. May also take one tablet every 6 hours as needed for pain   CARVEDILOL (COREG) 3.125 MG TABLET    Take 1 tablet (3.125 mg total) by mouth 2 (two) times daily with a meal.   DOCUSATE SODIUM 100 MG CAPS    Take 100 mg by mouth 2 (two) times daily.   DORZOLAMIDE-TIMOLOL (COSOPT) 22.3-6.8 MG/ML OPHTHALMIC SOLUTION    Place 1 drop into both eyes 2 (two) times daily.    FEEDING SUPPLEMENT, ENSURE COMPLETE, (ENSURE COMPLETE) LIQD    Take 237 mLs by mouth 2 (two) times daily between meals.   FENTANYL (DURAGESIC - DOSED MCG/HR) 12 MCG/HR    Place 1 patch (12.5 mcg total) onto the skin every 3 (three) days. Remove old patch   GUAIFENESIN (MUCINEX) 600 MG 12 HR TABLET    Take 1 tablet (600 mg total) by mouth 2 (two) times daily. Take for 6 days then stop.   HYDROCODONE-ACETAMINOPHEN (NORCO/VICODIN) 5-325 MG PER TABLET       LEVOTHYROXINE (SYNTHROID, LEVOTHROID) 75 MCG TABLET    Take 75 mcg by mouth daily before breakfast.   OMEPRAZOLE (PRILOSEC) 20 MG CAPSULE    Take 20 mg by mouth daily.   SENNA (SENOKOT) 8.6 MG TABS  TABLET    Take 1 tablet (8.6 mg total) by mouth 2 (two) times daily.   TRAMADOL (ULTRAM) 50 MG TABLET    Take one tablet by mouth twice daily for pain  Modified Medications   No medications on file  Discontinued Medications   FERROUS SULFATE 325 (65 FE) MG TABLET    Take 1 tablet (325 mg total) by mouth daily with breakfast.    Physical Exam: Filed Vitals:   06/22/15 1442  BP: 122/80  Pulse: 72  Temp: 97 F (36.1 C)  TempSrc: Tympanic  Resp: 18   There is no weight on file to calculate BMI.  Physical Exam  Constitutional: She is oriented to person, place, and time. She appears well-developed and well-nourished. No distress.  HENT:  Head: Normocephalic and atraumatic.  Right Ear: External ear normal.  Nose: Nose normal.  Mouth/Throat: No oropharyngeal exudate.  Eyes: Conjunctivae and EOM are normal. Pupils are equal, round, and reactive to light. Right eye exhibits no discharge. Left eye exhibits no discharge. No scleral icterus.  Neck: Normal  range of motion. Neck supple. No JVD present. No tracheal deviation present. No thyromegaly present.  Cardiovascular: Normal rate and regular rhythm.   Murmur heard. 3/6  Pulmonary/Chest: Effort normal. No stridor. No respiratory distress. She has no wheezes. She has no rales. She exhibits no tenderness.  Abdominal: Soft. Bowel sounds are normal. She exhibits no distension. There is no tenderness. There is no rebound and no guarding.  Genitourinary: Guaiac negative stool.  Musculoskeletal: Normal range of motion. She exhibits no edema or tenderness.  Pain is well managed with Fentanyl 36mcg/hr patch.    Lymphadenopathy:    She has no cervical adenopathy.  Neurological: She is alert and oriented to person, place, and time. No cranial nerve deficit. She exhibits normal muscle tone. Coordination normal.  Skin: Skin is warm and dry. No rash noted. She is not diaphoretic. No erythema. No pallor.  Let hip surgical scar.  Small keratotic  lesions anterior LLE and calf witch is injured with serous fluid leakage. No erythema, warmth, odorous drainage, and tenderness.     Psychiatric: She has a normal mood and affect. Her behavior is normal. Judgment and thought content normal.    Labs reviewed: Basic Metabolic Panel:  Recent Labs  16/10/96 0445 07/05/14 0445 07/06/14 0425  03/01/15 04/19/15 04/28/15  NA 132* 135* 131*  < > 138 143 139  K 3.8 4.3 4.1  < > 4.3 5.0 4.9  CL 98 99 96  --   --   --   --   CO2 21 24 22   --   --   --   --   GLUCOSE 117* 121* 110*  --   --   --   --   BUN 25* 35* 37*  < > 51* 47* 38*  CREATININE 0.90 1.15* 1.01  < > 1.0 1.1 1.2*  CALCIUM 8.0* 8.0* 8.0*  --   --   --   --   < > = values in this interval not displayed.  Liver Function Tests:  Recent Labs  07/01/14 0454  03/01/15 04/19/15 04/28/15  AST 17  < > 12* 12* 17  ALT 7  < > 8 8 12   ALKPHOS 63  < > 63 60 65  BILITOT 0.5  --   --   --   --   PROT 6.5  --   --   --   --   ALBUMIN 3.2*  --   --   --   --   < > = values in this interval not displayed.  CBC:  Recent Labs  06/30/14 1807 07/01/14 0632  07/04/14 2130 07/05/14 0445 07/06/14 0425  03/01/15 04/19/15 04/28/15  WBC 8.4 9.7  < > 12.0* 12.7* 11.6*  < > 7.7 10.2 17.6  NEUTROABS 5.8 6.9  --   --   --   --   --   --   --   --   HGB 10.6* 9.8*  < > 11.0* 10.5* 10.6*  < > 10.7* 10.7* 10.0*  HCT 32.7* 29.3*  < > 33.3* 30.7* 31.3*  < > 32* 33* 31*  MCV 91.1 89.6  < > 89.3 88.5 88.4  --   --   --   --   PLT 241 203  < > 189 205 250  < > 214 377 286  < > = values in this interval not displayed.  Lab Results  Component Value Date   TSH 2.97 04/19/2015   No results found  for: HGBA1C No results found for: CHOL, HDL, LDLCALC, LDLDIRECT, TRIG, CHOLHDL  Significant Diagnostic Results since last visit: none  Patient Care Team: [name], MD as PCP - General (Internal Medicine) [name], MD (Internal Medicine) [facility] Elise Benne, MD as  Consulting Physician (Ophthalmology) Edvin Albus X, NP as Nurse Practitioner (Nurse Practitioner) [facility] Elise Benne, MD as Consulting Physician (Ophthalmology) Shyler Hamill X, NP as Nurse Practitioner (Nurse Practitioner)  Assessment/Plan Problem List Items Addressed This Visit    Hypothyroidism (Chronic)    04/19/15 TSH 2.971. Continue Levothyroxine daily      Post herpetic neuralgia (Chronic)    Continued on pain regimen of fentanyl patch 72mcg/hr  and gabapentin 100mg  tid, and Tylenol 650mg  tid prn, denied pain today.       Anemia of chronic disease (Chronic)    04/19/15 Hgb 10.7, 04/28/15 Hgb 10.0, off Iron      Essential hypertension    Controlled, continue Coreg 3.125mg  bid. Dc ASA R>B presently.       Edema    Resolved, not adequate oral intake as resultant of her debilitation, dc Furosemide, observe the patient.       Congestive heart failure    Compensated clinically, dc Furosemide, observe for s/s of developing CHF      Debilitated - Primary    Contributory factors: advanced age, CHF, last PNA 04/2015. The patient desires no hospitalization, comfort measures only. She stated its blessing she is not in pain or discomfort. No respiratory distress or SOB. She does takes O2 [place], able to maintain Sat O2>89%.           Family/ staff Communication: comfort measures  Labs/tests ordered: none  ManXie Nassir Neidert NP Geriatrics [facility] [address] [place], [place] [address] On Call:  [phone] & follow prompts after 5pm & weekends Office Phone:  [phone] Office Fax:  [phone]

## 2015-07-23 DEATH — deceased

## 2015-09-17 IMAGING — CR DG LUMBAR SPINE COMPLETE 4+V
5 series · 5 of 5 positions shown · non-contrast
Comparison: CT abdomen and pelvis 03/04/2006 and chest in two views
abdomen 12/30/2006.

CLINICAL DATA: Fall. Low back pain.

EXAM:
LUMBAR SPINE - COMPLETE 4+ VIEW

[t lumbar spine ap]
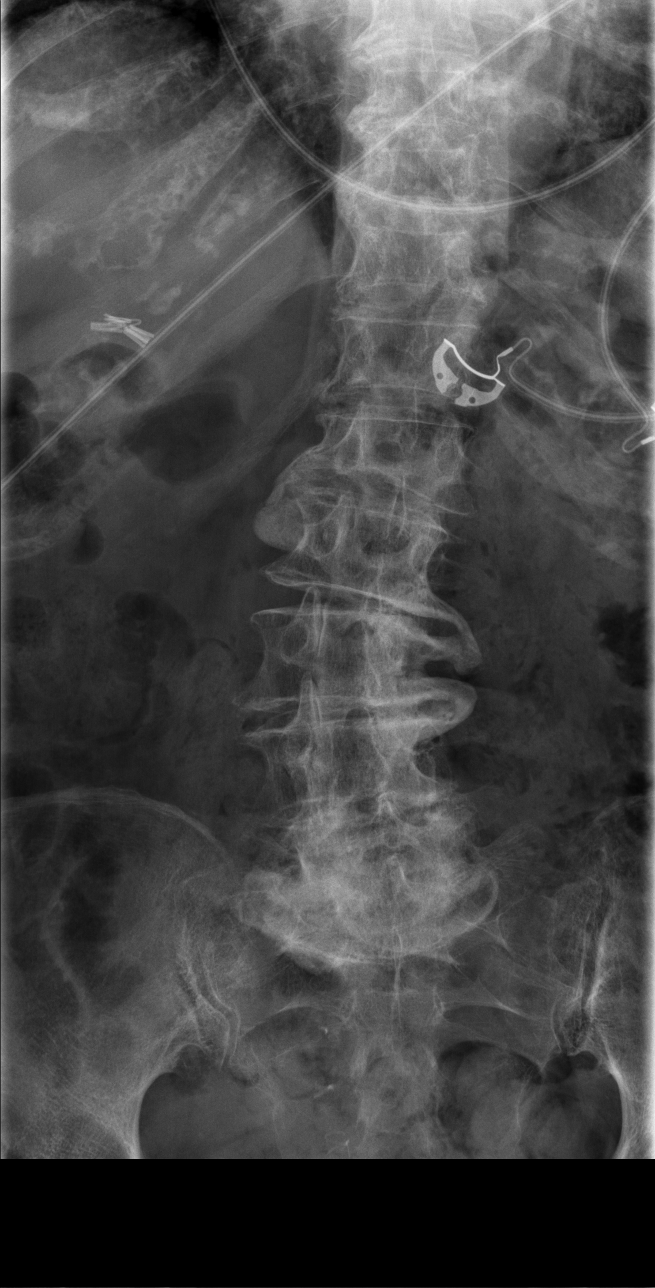

[t lumbar spine obl (1 of 2)]
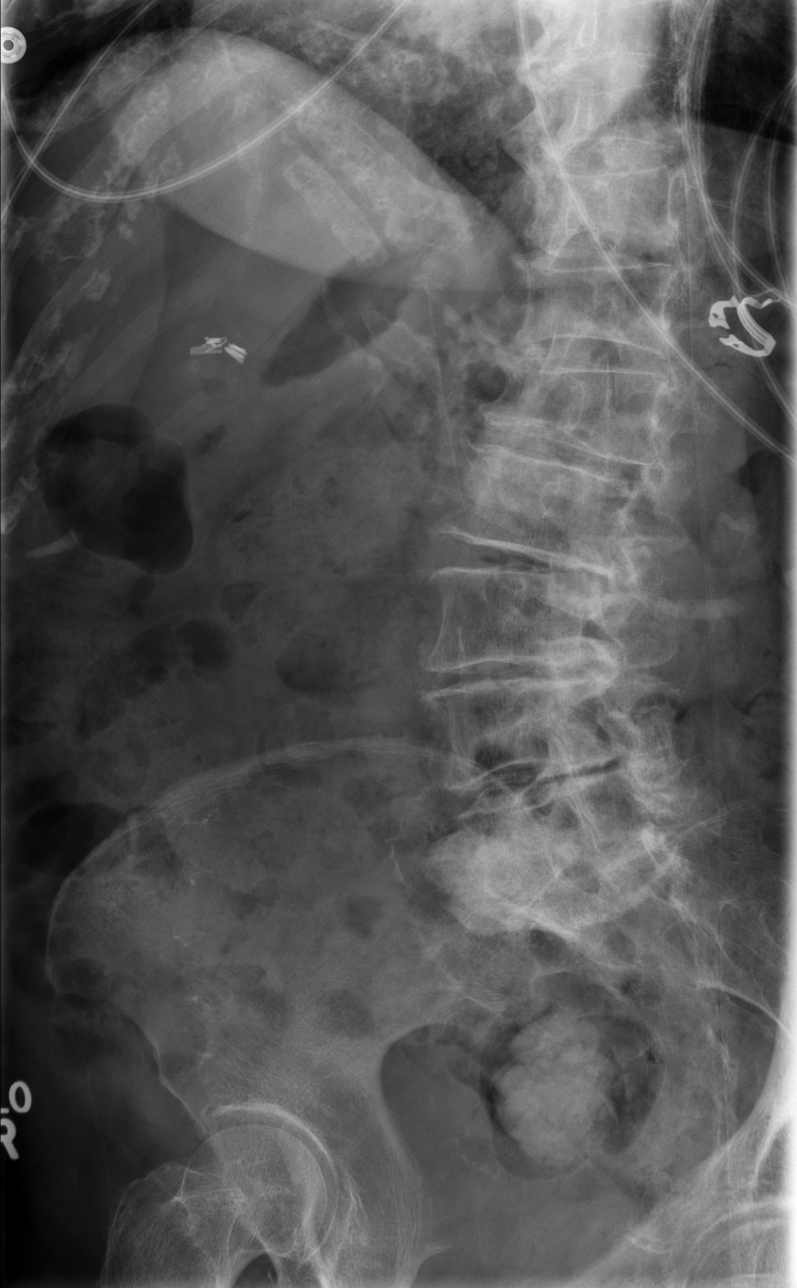

[t lumbar spine obl (2 of 2)]
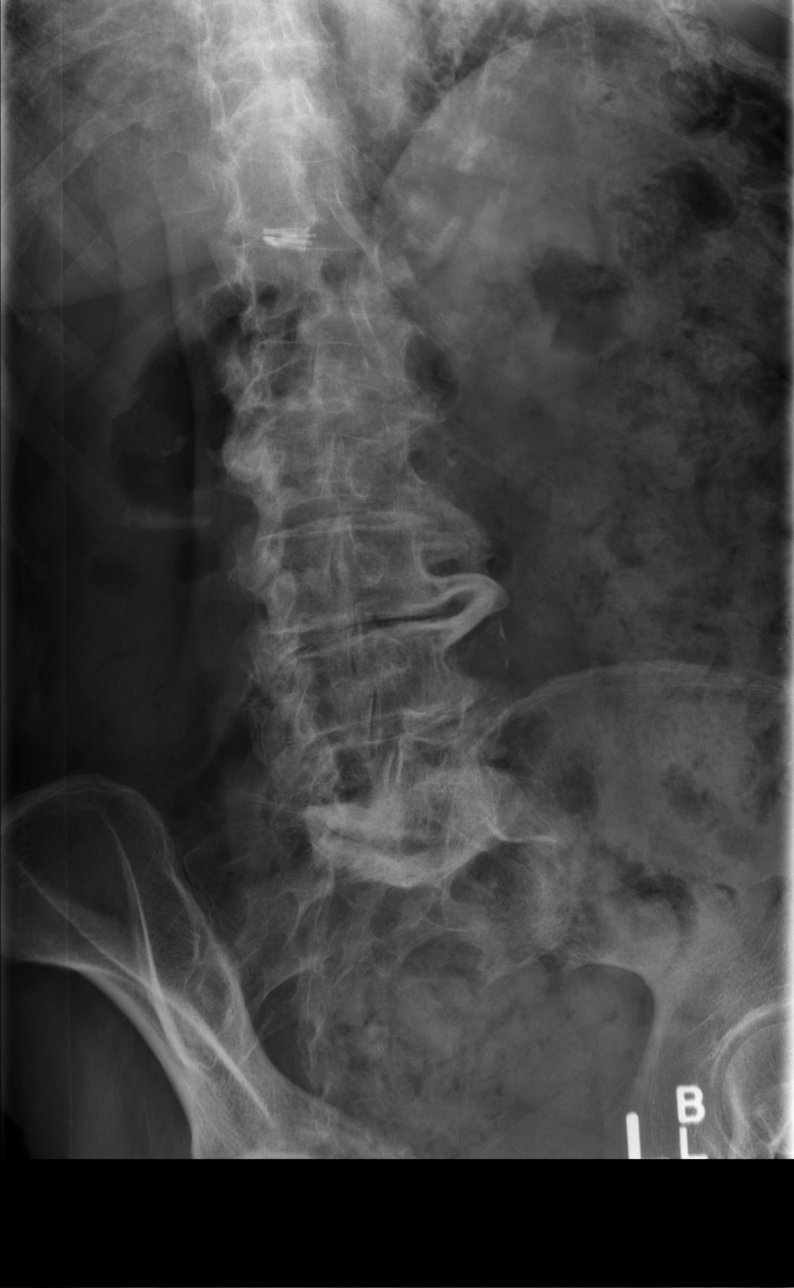

[t lumbar spine lat]
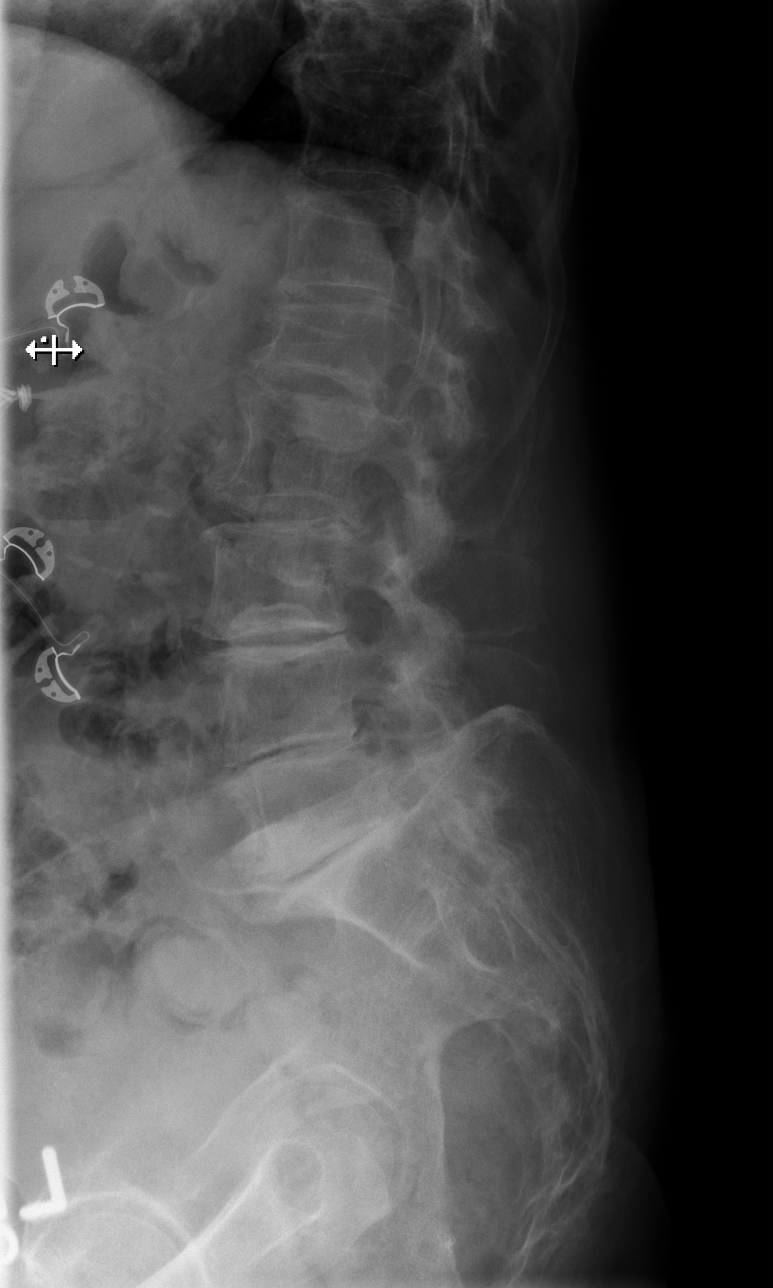

[t lumbar l-5 s-1 spot]
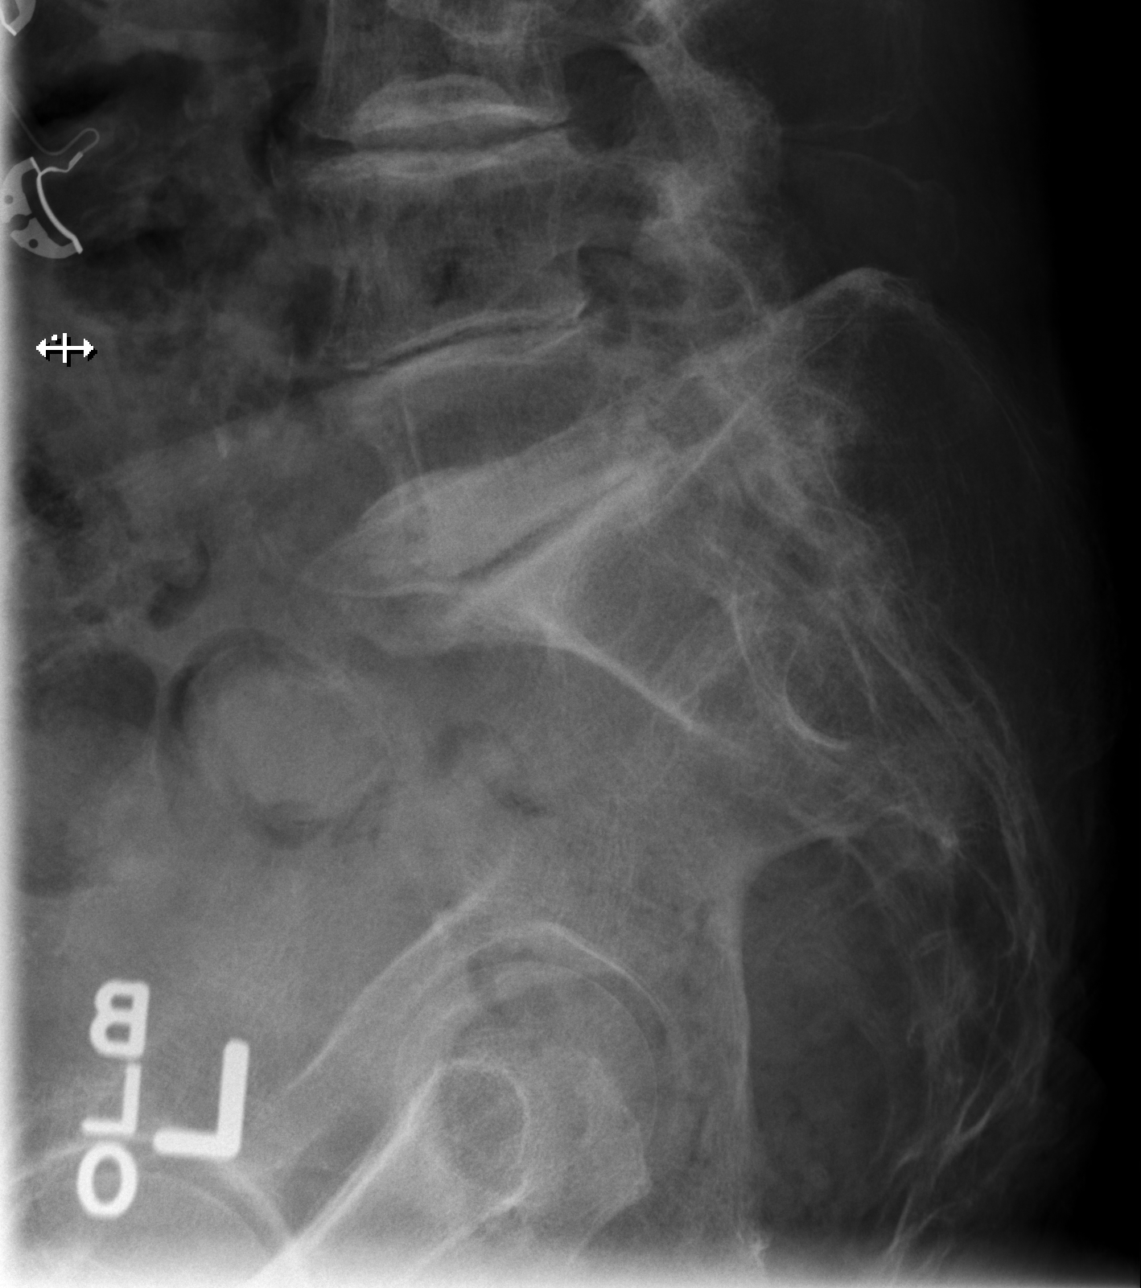

[5 of 5 positions shown; findings below may reference images not displayed]

FINDINGS: There is marked convex left scoliosis and severe multilevel
degenerative disease. A remote inferior endplate compression
fracture of L1 is identified. No acute fracture is seen. Paraspinous
structures are unremarkable.
IMPRESSION: No acute finding.

Remote L1 inferior endplate compression fracture.

Convex right scoliosis and severe multilevel degenerative change.

## 2015-09-17 IMAGING — CR DG HIP (WITH OR WITHOUT PELVIS) 2-3V*L*
3 series · 3 of 3 positions shown · non-contrast
Comparison: None.

CLINICAL DATA: Left hip pain after fall.

EXAM:
LEFT HIP - COMPLETE 2+ VIEW

[t pelvis ap]
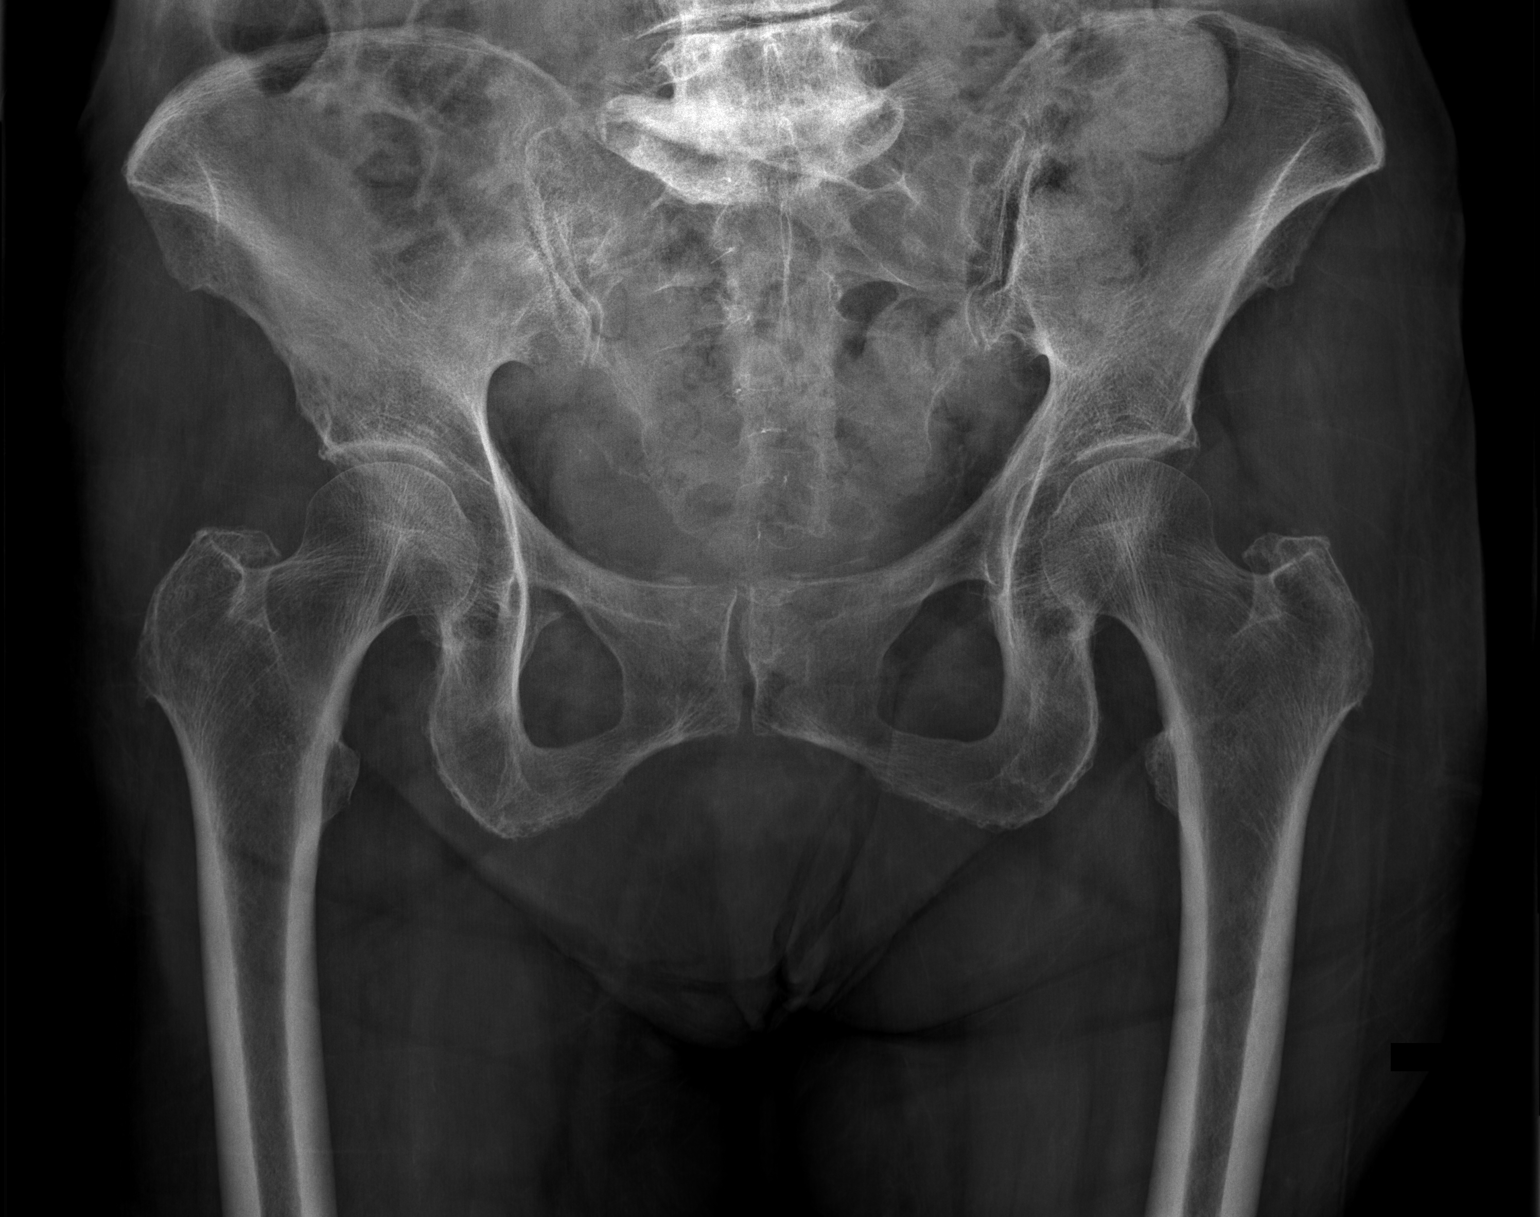

[t hip ap left]
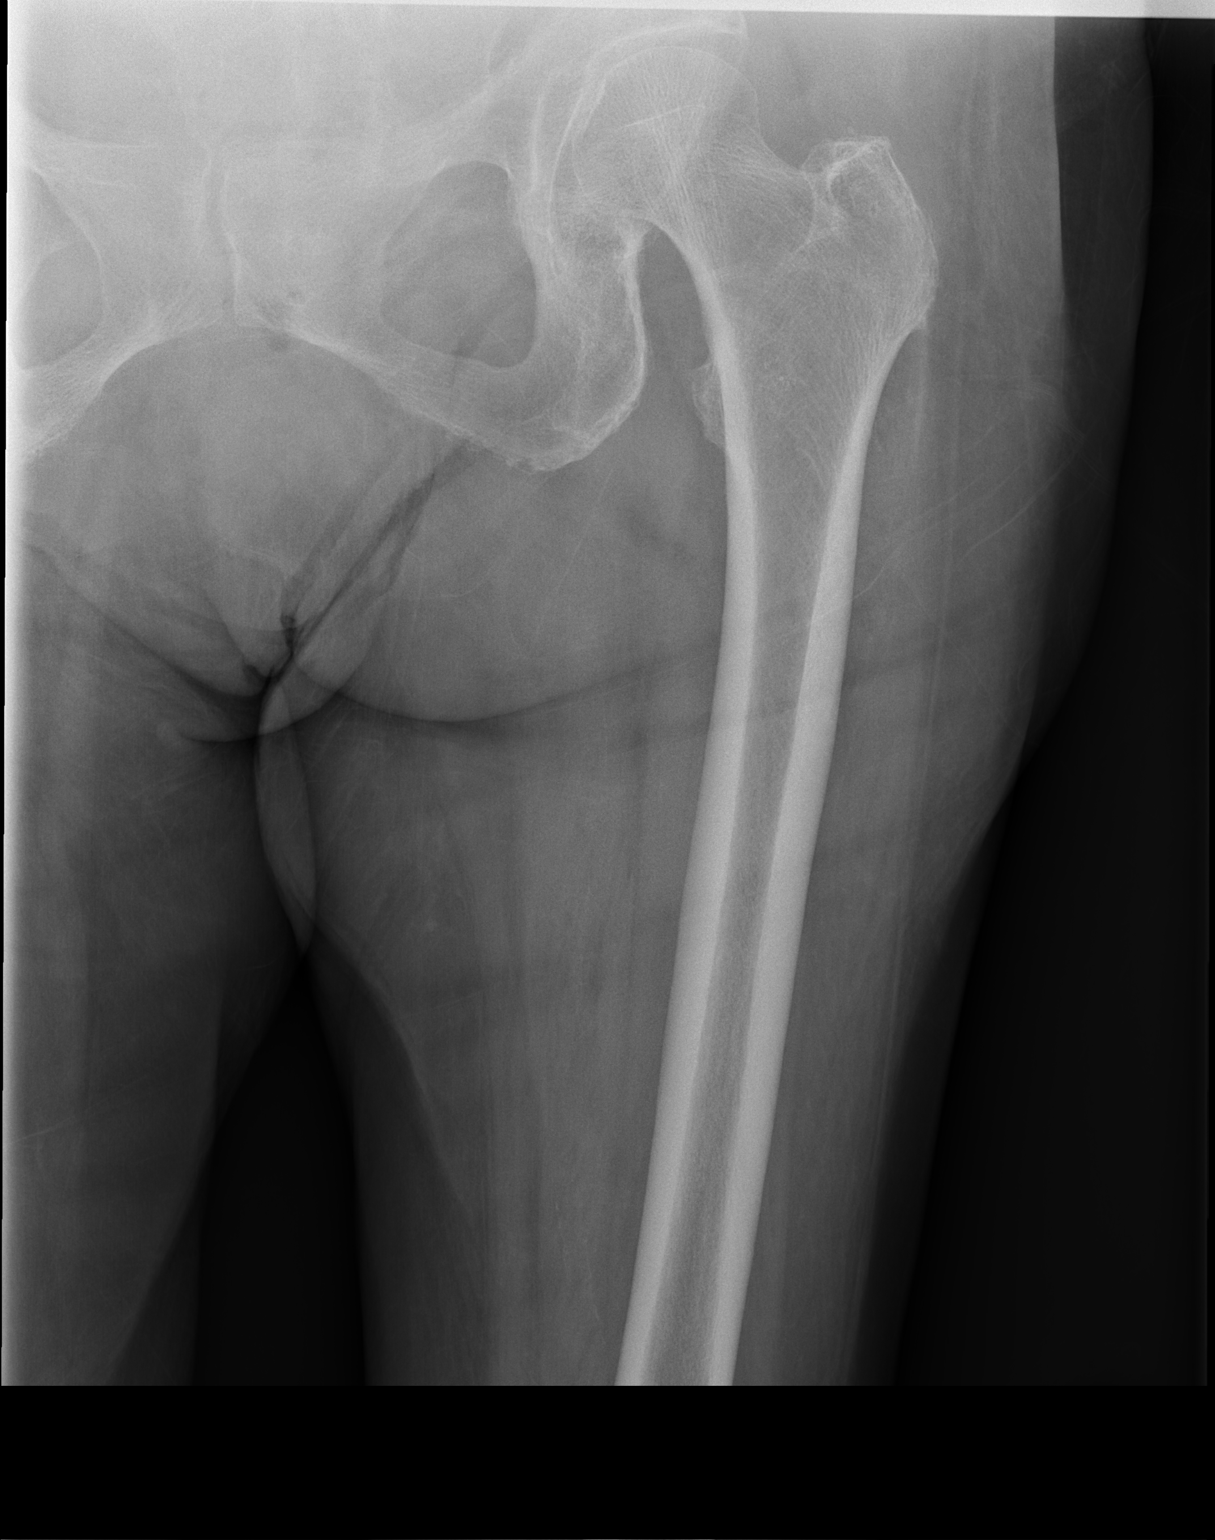

[t hip frog leg left]
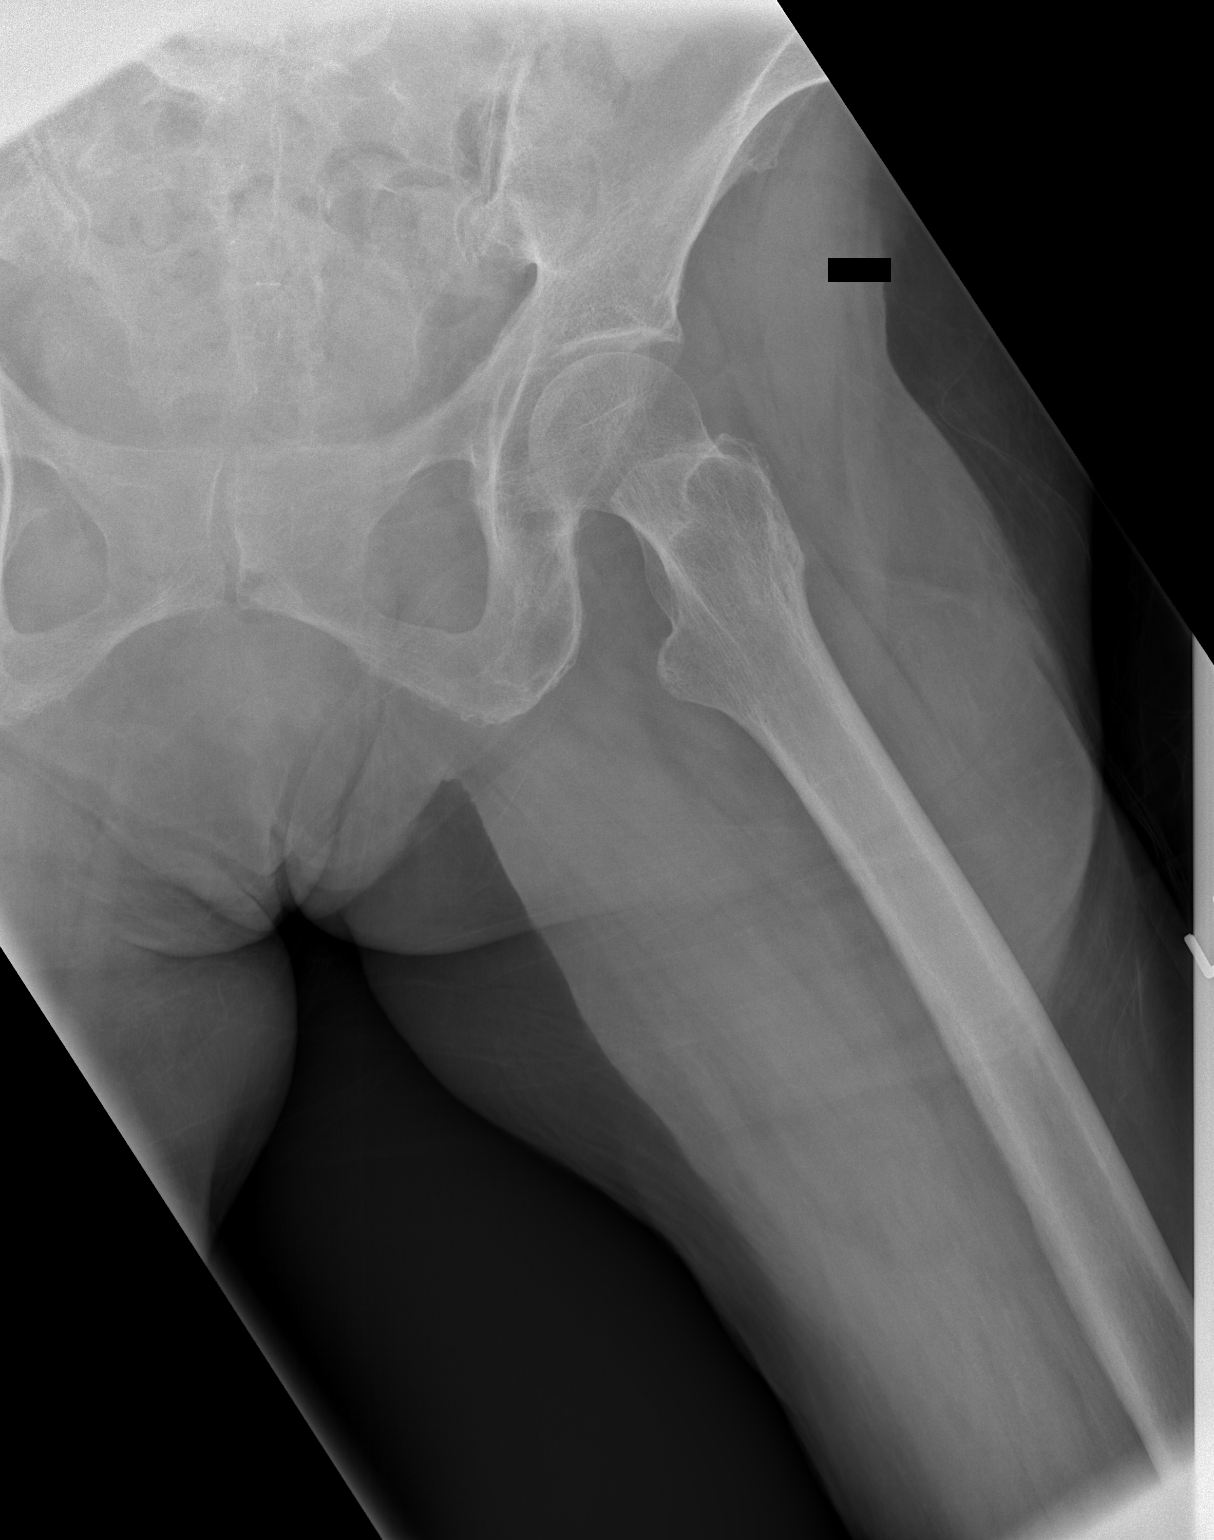

[3 of 3 positions shown; findings below may reference images not displayed]

FINDINGS: There is no evidence of hip fracture or dislocation. There is no
evidence of arthropathy or other focal bone abnormality.
IMPRESSION: Normal left hip.
